# Patient Record
Sex: Female | Born: 1960 | Race: White | Hispanic: Yes | Marital: Single | Smoking: Never smoker
Health system: Southern US, Community
[De-identification: ages and names within clinical notes are randomized; demographics above are authoritative.]

## PROBLEM LIST (undated history)

## (undated) DIAGNOSIS — G4730 Sleep apnea, unspecified: Secondary | ICD-10-CM

## (undated) DIAGNOSIS — T4145XA Adverse effect of unspecified anesthetic, initial encounter: Secondary | ICD-10-CM

## (undated) DIAGNOSIS — Z9889 Other specified postprocedural states: Secondary | ICD-10-CM

## (undated) DIAGNOSIS — J45909 Unspecified asthma, uncomplicated: Secondary | ICD-10-CM

## (undated) DIAGNOSIS — M1711 Unilateral primary osteoarthritis, right knee: Secondary | ICD-10-CM

## (undated) DIAGNOSIS — M1990 Unspecified osteoarthritis, unspecified site: Secondary | ICD-10-CM

## (undated) DIAGNOSIS — S069X9A Unspecified intracranial injury with loss of consciousness of unspecified duration, initial encounter: Secondary | ICD-10-CM

## (undated) DIAGNOSIS — F419 Anxiety disorder, unspecified: Secondary | ICD-10-CM

## (undated) DIAGNOSIS — T8859XA Other complications of anesthesia, initial encounter: Secondary | ICD-10-CM

## (undated) DIAGNOSIS — N946 Dysmenorrhea, unspecified: Secondary | ICD-10-CM

## (undated) DIAGNOSIS — Z8489 Family history of other specified conditions: Secondary | ICD-10-CM

## (undated) DIAGNOSIS — K219 Gastro-esophageal reflux disease without esophagitis: Secondary | ICD-10-CM

## (undated) DIAGNOSIS — E785 Hyperlipidemia, unspecified: Secondary | ICD-10-CM

## (undated) DIAGNOSIS — A630 Anogenital (venereal) warts: Secondary | ICD-10-CM

## (undated) DIAGNOSIS — D219 Benign neoplasm of connective and other soft tissue, unspecified: Secondary | ICD-10-CM

## (undated) DIAGNOSIS — M1731 Unilateral post-traumatic osteoarthritis, right knee: Secondary | ICD-10-CM

## (undated) DIAGNOSIS — R112 Nausea with vomiting, unspecified: Secondary | ICD-10-CM

## (undated) DIAGNOSIS — N939 Abnormal uterine and vaginal bleeding, unspecified: Secondary | ICD-10-CM

## (undated) HISTORY — DX: Benign neoplasm of connective and other soft tissue, unspecified: D21.9

## (undated) HISTORY — DX: Gastro-esophageal reflux disease without esophagitis: K21.9

## (undated) HISTORY — DX: Dysmenorrhea, unspecified: N94.6

## (undated) HISTORY — DX: Anxiety disorder, unspecified: F41.9

## (undated) HISTORY — PX: LIPOMA EXCISION: SHX5283

## (undated) HISTORY — DX: Abnormal uterine and vaginal bleeding, unspecified: N93.9

## (undated) HISTORY — DX: Anogenital (venereal) warts: A63.0

---
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
## 1984-06-21 DIAGNOSIS — S069X9A Unspecified intracranial injury with loss of consciousness of unspecified duration, initial encounter: Secondary | ICD-10-CM

## 1984-06-21 HISTORY — PX: RECONSTRUCTION OF NOSE: SHX2301

## 1984-06-21 HISTORY — DX: Unspecified intracranial injury with loss of consciousness of unspecified duration, initial encounter: S06.9X9A

## 1992-06-21 DIAGNOSIS — A630 Anogenital (venereal) warts: Secondary | ICD-10-CM

## 1992-06-21 HISTORY — DX: Anogenital (venereal) warts: A63.0

## 1995-06-22 HISTORY — PX: BREAST SURGERY: SHX581

## 1995-09-20 HISTORY — PX: BREAST EXCISIONAL BIOPSY: SUR124

## 1999-03-05 ENCOUNTER — Other Ambulatory Visit: Admission: RE | Admit: 1999-03-05 | Discharge: 1999-03-05 | Payer: Self-pay | Admitting: *Deleted

## 1999-04-23 ENCOUNTER — Encounter: Payer: Self-pay | Admitting: Obstetrics and Gynecology

## 1999-04-23 ENCOUNTER — Ambulatory Visit (HOSPITAL_COMMUNITY): Admission: RE | Admit: 1999-04-23 | Discharge: 1999-04-23 | Payer: Self-pay | Admitting: Obstetrics and Gynecology

## 1999-05-28 ENCOUNTER — Other Ambulatory Visit: Admission: RE | Admit: 1999-05-28 | Discharge: 1999-05-28 | Payer: Self-pay | Admitting: Surgery

## 2000-03-14 ENCOUNTER — Other Ambulatory Visit: Admission: RE | Admit: 2000-03-14 | Discharge: 2000-03-14 | Payer: Self-pay | Admitting: *Deleted

## 2001-06-21 HISTORY — PX: SHOULDER SURGERY: SHX246

## 2001-07-07 ENCOUNTER — Other Ambulatory Visit: Admission: RE | Admit: 2001-07-07 | Discharge: 2001-07-07 | Payer: Self-pay | Admitting: Obstetrics and Gynecology

## 2002-07-25 ENCOUNTER — Other Ambulatory Visit: Admission: RE | Admit: 2002-07-25 | Discharge: 2002-07-25 | Payer: Self-pay | Admitting: Obstetrics and Gynecology

## 2002-09-11 ENCOUNTER — Encounter (INDEPENDENT_AMBULATORY_CARE_PROVIDER_SITE_OTHER): Payer: Self-pay | Admitting: Specialist

## 2002-09-11 ENCOUNTER — Inpatient Hospital Stay (HOSPITAL_COMMUNITY): Admission: RE | Admit: 2002-09-11 | Discharge: 2002-09-13 | Payer: Self-pay | Admitting: Obstetrics and Gynecology

## 2002-09-11 HISTORY — PX: ABDOMINAL HYSTERECTOMY: SHX81

## 2004-10-23 ENCOUNTER — Ambulatory Visit (HOSPITAL_COMMUNITY): Admission: RE | Admit: 2004-10-23 | Discharge: 2004-10-23 | Payer: Self-pay | Admitting: *Deleted

## 2004-10-29 ENCOUNTER — Encounter: Admission: RE | Admit: 2004-10-29 | Discharge: 2004-10-29 | Payer: Self-pay | Admitting: *Deleted

## 2006-04-27 ENCOUNTER — Ambulatory Visit: Payer: Self-pay | Admitting: Internal Medicine

## 2006-04-29 ENCOUNTER — Encounter: Admission: RE | Admit: 2006-04-29 | Discharge: 2006-04-29 | Payer: Self-pay | Admitting: Obstetrics & Gynecology

## 2006-05-24 ENCOUNTER — Ambulatory Visit: Payer: Self-pay | Admitting: Internal Medicine

## 2006-10-25 ENCOUNTER — Encounter: Admission: RE | Admit: 2006-10-25 | Discharge: 2006-10-25 | Payer: Self-pay | Admitting: Obstetrics & Gynecology

## 2007-03-07 ENCOUNTER — Ambulatory Visit: Payer: Self-pay | Admitting: Internal Medicine

## 2007-03-09 ENCOUNTER — Ambulatory Visit (HOSPITAL_COMMUNITY): Admission: RE | Admit: 2007-03-09 | Discharge: 2007-03-09 | Payer: Self-pay | Admitting: Internal Medicine

## 2007-04-06 ENCOUNTER — Ambulatory Visit: Payer: Self-pay | Admitting: Internal Medicine

## 2007-04-06 DIAGNOSIS — K449 Diaphragmatic hernia without obstruction or gangrene: Secondary | ICD-10-CM

## 2007-05-02 ENCOUNTER — Encounter: Admission: RE | Admit: 2007-05-02 | Discharge: 2007-05-02 | Payer: Self-pay | Admitting: Obstetrics & Gynecology

## 2007-05-15 ENCOUNTER — Ambulatory Visit: Payer: Self-pay | Admitting: Internal Medicine

## 2007-07-14 DIAGNOSIS — F329 Major depressive disorder, single episode, unspecified: Secondary | ICD-10-CM

## 2007-07-14 DIAGNOSIS — K219 Gastro-esophageal reflux disease without esophagitis: Secondary | ICD-10-CM

## 2007-07-14 DIAGNOSIS — E785 Hyperlipidemia, unspecified: Secondary | ICD-10-CM | POA: Insufficient documentation

## 2007-12-18 ENCOUNTER — Other Ambulatory Visit: Admission: RE | Admit: 2007-12-18 | Discharge: 2007-12-18 | Payer: Self-pay | Admitting: Obstetrics and Gynecology

## 2007-12-25 ENCOUNTER — Encounter: Admission: RE | Admit: 2007-12-25 | Discharge: 2007-12-25 | Payer: Self-pay | Admitting: Obstetrics & Gynecology

## 2008-01-02 ENCOUNTER — Telehealth (INDEPENDENT_AMBULATORY_CARE_PROVIDER_SITE_OTHER): Payer: Self-pay | Admitting: *Deleted

## 2008-01-04 ENCOUNTER — Encounter: Payer: Self-pay | Admitting: Internal Medicine

## 2008-07-12 ENCOUNTER — Encounter: Payer: Self-pay | Admitting: Pulmonary Disease

## 2008-09-05 ENCOUNTER — Encounter: Payer: Self-pay | Admitting: Pulmonary Disease

## 2009-01-03 ENCOUNTER — Ambulatory Visit (HOSPITAL_COMMUNITY): Admission: RE | Admit: 2009-01-03 | Discharge: 2009-01-03 | Payer: Self-pay | Admitting: Obstetrics & Gynecology

## 2009-09-08 ENCOUNTER — Encounter: Admission: RE | Admit: 2009-09-08 | Discharge: 2009-09-08 | Payer: Self-pay | Admitting: Occupational Medicine

## 2010-01-06 ENCOUNTER — Ambulatory Visit (HOSPITAL_COMMUNITY): Admission: RE | Admit: 2010-01-06 | Discharge: 2010-01-06 | Payer: Self-pay | Admitting: Obstetrics & Gynecology

## 2010-04-30 ENCOUNTER — Ambulatory Visit: Payer: Self-pay | Admitting: Pulmonary Disease

## 2010-04-30 DIAGNOSIS — G4730 Sleep apnea, unspecified: Secondary | ICD-10-CM | POA: Insufficient documentation

## 2010-05-02 ENCOUNTER — Encounter: Payer: Self-pay | Admitting: Pulmonary Disease

## 2010-05-14 DIAGNOSIS — R0989 Other specified symptoms and signs involving the circulatory and respiratory systems: Secondary | ICD-10-CM

## 2010-05-14 DIAGNOSIS — R0609 Other forms of dyspnea: Secondary | ICD-10-CM

## 2010-05-20 ENCOUNTER — Telehealth (INDEPENDENT_AMBULATORY_CARE_PROVIDER_SITE_OTHER): Payer: Self-pay | Admitting: *Deleted

## 2010-05-25 ENCOUNTER — Ambulatory Visit (HOSPITAL_COMMUNITY)
Admission: RE | Admit: 2010-05-25 | Discharge: 2010-05-25 | Payer: Self-pay | Source: Home / Self Care | Admitting: Pulmonary Disease

## 2010-05-25 ENCOUNTER — Encounter: Payer: Self-pay | Admitting: Pulmonary Disease

## 2010-06-23 ENCOUNTER — Telehealth (INDEPENDENT_AMBULATORY_CARE_PROVIDER_SITE_OTHER): Payer: Self-pay | Admitting: *Deleted

## 2010-06-25 ENCOUNTER — Ambulatory Visit
Admission: RE | Admit: 2010-06-25 | Discharge: 2010-06-25 | Payer: Self-pay | Source: Home / Self Care | Attending: Pulmonary Disease | Admitting: Pulmonary Disease

## 2010-06-25 DIAGNOSIS — J45909 Unspecified asthma, uncomplicated: Secondary | ICD-10-CM | POA: Insufficient documentation

## 2010-07-08 ENCOUNTER — Telehealth (INDEPENDENT_AMBULATORY_CARE_PROVIDER_SITE_OTHER): Payer: Self-pay | Admitting: *Deleted

## 2010-07-10 ENCOUNTER — Ambulatory Visit
Admission: RE | Admit: 2010-07-10 | Discharge: 2010-07-10 | Payer: Self-pay | Source: Home / Self Care | Attending: Pulmonary Disease | Admitting: Pulmonary Disease

## 2010-07-10 DIAGNOSIS — G4733 Obstructive sleep apnea (adult) (pediatric): Secondary | ICD-10-CM | POA: Insufficient documentation

## 2010-07-23 NOTE — Assessment & Plan Note (Signed)
Summary: self referral for doe   Copy to:  Self Referral Primary Provider/Referring Provider:  n/a  CC:  pt c/o pt states she has exercise induced asthma. .  History of Present Illness: The pt is a 49y/o female who comes in today as a self referral for dyspnea.  She currently works out at [employer] which is quite strenous and challenging, and feels like she "can't breathe" during the work out.  She does not have any issue with more normal exercise or daily activities.  She can hear audible "wheezing" when this occurs, and wonders whether this may be a manifestation of asthma.  It will usually go away with 10-35min of rest.  She denies any issue with cough or mucus.  What is odd is the pt describes the "wheezing" as being more on inhalation than exhalation.  She has tried proair in the past with no change.  The pt denies any h/o childhood asthma or other pulmonary issues.  She denies any postnasal drip or sinus issues, but does have significant GERD for which she takes zegerid.  She does not feel she has breakthru symptoms if she stays on the med compliantly.  She tells me that she had a cxr last year,and was unremarkable.  Preventive Screening-Counseling & Management  Alcohol-Tobacco     Smoking Status: never  Current Medications (verified): 1)  Zegerid 40-1100 Mg  Caps (Omeprazole-Sodium Bicarbonate) .... Two Times A Day 2)  Proair Hfa 108 (90 Base) Mcg/act Aers (Albuterol Sulfate) .Marland Kitchen.. 1-2 Puffs Every 4-6 Hrs As Needed  Allergies (verified): No Known Drug Allergies  Past History:  Past Medical History:  SLEEP APNEA (ICD-780.57) DEPRESSION (ICD-311) HYPERLIPIDEMIA (ICD-272.4) HIATAL HERNIA (ICD-553.3) GERD (ICD-530.81)    Past Surgical History: hysterectomy in 2004 cyst removed in left breast nose surgery appendectomy in 1984 bone spurs removed right shoulder  Family History: Reviewed history and no changes required. breast cancer--pgm liver  cancer--pgm MI--mgf  Social History: Reviewed history and no changes required. Patient never smoked.  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  The patient denies shortness of breath at rest, productive cough, non-productive cough, coughing up blood, chest pain, irregular heartbeats, indigestion, loss of appetite, weight change, abdominal pain, difficulty swallowing, sore throat, tooth/dental problems, headaches, nasal congestion/difficulty breathing through nose, sneezing, itching, anxiety, depression, hand/feet swelling, rash, change in color of mucus, and fever.    Vital Signs:  Patient profile:   50 year old female Height:      64 inches Weight:      215.50 pounds BMI:     37.12 O2 Sat:      100 % on Room air Temp:     98.1 degrees F oral Pulse rate:   63 / minute BP sitting:   100 / 62  (left arm) Cuff size:   large  Vitals Entered By: Carver Fila (April 30, 2010 11:13 AM)  O2 Flow:  Room air CC: pt c/o pt states she has exercise induced asthma.  Comments meds and allergies updated Phone number updated Mindy Edward Jolly  April 30, 2010 11:13 AM    Physical Exam  General:  obese female in nad Eyes:  PERRLA and EOMI.   Nose:  large turbinates with swelling and narrowing of nasal airway Mouth:  no exudates or other lesions seen. Neck:  no jvd, tmg, LN Lungs:  totally clear to auscultation, no wheezing or rhonchi no upper airway noise Heart:  rrr, 2/6 sem. Abdomen:  soft and nontender, bs+ Extremities:  no significant edema or cyanosis  pulses intact distally. Neurologic:  alert and oriented, moves all 4.   Impression & Recommendations:  Problem # 1:  DYSPNEA ON EXERTION (ICD-786.09) the pt is noting doe and audible "wheezing" while doing extremely strenous crossfit, but does not have with more traditional  exercise activity.  She has no issues with her day to day life, which is quite active.  Her spirometry today is totally normal, and the inspiratory limb on her flow volume loop is totally normal (nothing to suggest ua obstruction that would explain her wheezing on inspiration).  I am wondering if this is related to severe straining during crossfit with upper airway pseudowheezing.  Her sob may simply be related to the strenous nature of the exercise.  Her history is not suggestive of EIA, but I cannot exclude.  She does have significant GERD symptoms, and I wonder if this may be hypersensitizing her ua?  Will try her on two times a day PPI to see if things improve.  If they do not, would consider doing EIA protocol on treadmill for evaluation.  The pt is to call and let me know.  Medications Added to Medication List This Visit: 1)  Proair Hfa 108 (90 Base) Mcg/act Aers (Albuterol sulfate) .Marland Kitchen.. 1-2 puffs every 4-6 hrs as needed  Patient Instructions: 1)  will try to double up on your acid reflux medication.  Take your zegerid in the am's, and dexilant at bedtime.  Let me know in 2 weeks how things are going.  If this continues, will schedule for exercise induced asthma study.   Immunization History:  Influenza Immunization History:    Influenza:  historical (04/21/2010)   Appended Document: Orders Update    Clinical Lists Changes  Orders: Added new Service order of New Patient Level IV (16109) - Signed Added new Service order of Spirometry w/Graph (94010) - Signed

## 2010-07-23 NOTE — Progress Notes (Signed)
Summary: MCT results---received  Phone Note Call from Patient Call back at Home Phone [phone]   Caller: Patient Call For: clance Reason for Call: Lab or Test Results Summary of Call: Pt requests results of methacholine test. Initial call taken by: Darletta Moll,  June 23, 2010 1:35 PM  Follow-up for Phone Call        Pls advise results of MCT done on 05/26/10, thanks! Vernie Murders  June 23, 2010 3:49 PM   Additional Follow-up for Phone Call Additional follow up Details #1::        please track down results of meth challenge and will cal pt with results. Additional Follow-up by: Barbaraann Share MD,  June 23, 2010 10:08 PM    Additional Follow-up for Phone Call Additional follow up Details #2::    MCT results received and placed in KC's purple very important folder.Michel Bickers CMA  June 24, 2010 9:02 AM  Additional Follow-up for Phone Call Additional follow up Details #3:: Details for Additional Follow-up Action Taken: please let pt know that her meth challenge test was positive for reactive airways disease....she needs to get started on some type of inhaler.  see if she can come in for a sample and demonstration of how to use...see if tomorrow at 10 ok   called and spoke with pt and she is coming in at 10 to discuss these results Carver Fila  June 24, 2010 5:32 PM  Additional Follow-up by: Barbaraann Share MD,  June 24, 2010 5:27 PM

## 2010-07-23 NOTE — Progress Notes (Signed)
Summary: DOE no better-LMTCbx1  Phone Note Call from Patient   Caller: Patient Call For: clance Summary of Call: pt still c/o sob w/ activity. ready to have tests done per dr clance/ discussion w/ pt at last ov. call pt at work # until 4:30 [phone]. cell afterwards [phone] Initial call taken by: Tivis Ringer, CNA,  May 20, 2010 2:27 PM  Follow-up for Phone Call        Spoke with pt.  She was last seen for initial consult for DOE on 04-30-10 and was told to double up acid suppression meds and call back with progress.  She states that her breathing is unchanged.  She would like to proceed with next step.  Pls advise thanks! Follow-up by: Vernie Murders,  May 20, 2010 3:41 PM  Additional Follow-up for Phone Call Additional follow up Details #1::        she will need to be scheduled for methacholine challenge test.  Let her know this is a more sensitive test for asthma than the treadmill test we discussed.  it involves blowing into breathing machine after breathing in a medication that can irritate the airways in pts with asthma.  It will put issue of asthma to rest.  I will send order to [facility] Additional Follow-up by: Barbaraann Share MD,  May 20, 2010 5:18 PM    Additional Follow-up for Phone Call Additional follow up Details #2::    [email or web address]. Carron Curie CMA  May 20, 2010 5:23 PM  spoke to pt she is aware of appt for methacholine @[facility]  05/26/10@1 :00pm however she may call to reschedule she was given direct# to call to do so  Follow-up by: Oneita Jolly,  May 21, 2010 9:07 AM

## 2010-07-23 NOTE — Progress Notes (Signed)
Summary: quitt medication and sleep study > discussed at 1.20.12 OV  Phone Note Call from Patient   Caller: Patient Call For: dr clance Summary of Call: patient phoned she had her sleep study at the [facility] [address] [place] over a year ago. Stated she signed the release the last time that she was here but could not remember where she had it at. Patient can be reached [phone]. Also she quitt the [facility] she stated that it made her sick and made her Ashtma worse.  Follow-up for Phone Call        Pt c/o having headaches, chest congestion "fluid in chest", nasal drainage "snot clots" green/red in color. Last dose of Dulera was on Monday and no longer has above symptoms. Sleep study obtained and placed on Megan's desk. Thanks. Zackery Barefoot CMA  July 08, 2010 12:49 PM    Additional Follow-up for Phone Call Additional follow up Details #1::        she has appt with me on friday for sleep apnea. will discuss other alternatives with her then. Additional Follow-up by: Barbaraann Share MD,  July 08, 2010 4:48 PM    Additional Follow-up for Phone Call Additional follow up Details #2::    [email or web address] Vernie Murders  July 08, 2010 4:57 PM  [facility].  Aundra Millet Reynolds LPN  July 09, 2010 4:39 PM   pt was arrived and seen for consult w/ KC on 1.20.12.  will sign off on message. Boone Master CNA/MA  July 13, 2010 5:15 PM

## 2010-07-23 NOTE — Assessment & Plan Note (Signed)
Summary: rov to discuss +meth. challenge test.   Copy to:  Self Referral Primary Provider/Referring Provider:  n/a  CC:  Pt is here for a f/u appt to discuss MCT results.  .  History of Present Illness: the pt comes in today for f/u of her recent methacholine challenge test, done as part of a w/u for possible EIA or even chronic asthma.  She had a > 20% reduction in FVC and FEV1 at a dilution of 8.0, and only partial recovery with one treatment of albuterol.  I have reviewed the study with her in detail, and answered all of her questions.  Current Medications (verified): 1)  Zegerid 40-1100 Mg  Caps (Omeprazole-Sodium Bicarbonate) .... Take 1 Tablet By Mouth Once A Day  Allergies (verified): No Known Drug Allergies  Review of Systems       The patient complains of shortness of breath with activity, nasal congestion/difficulty breathing through nose, and joint stiffness or pain.  The patient denies shortness of breath at rest, productive cough, non-productive cough, coughing up blood, chest pain, irregular heartbeats, acid heartburn, indigestion, loss of appetite, weight change, abdominal pain, difficulty swallowing, sore throat, tooth/dental problems, headaches, sneezing, itching, ear ache, anxiety, depression, hand/feet swelling, rash, change in color of mucus, and fever.    Vital Signs:  Patient profile:   50 year old female Height:      64 inches Weight:      221.13 pounds BMI:     38.09 O2 Sat:      99 % on Room air Temp:     97.7 degrees F oral Pulse rate:   62 / minute BP sitting:   114 / 78  (left arm) Cuff size:   large  Vitals Entered By: Arman Filter LPN (June 25, 2010 9:59 AM)  O2 Flow:  Room air CC: Pt is here for a f/u appt to discuss MCT results.   Comments Medications reviewed with patient Arman Filter LPN  June 25, 2010 9:59 AM    Physical Exam  General:  ow female in nad  Lungs:  clear to auscultation Heart:  rrr Extremities:  no edema or  cyanosis Neurologic:  alert and oriented, moves all 4.   Impression & Recommendations:  Problem # 1:  INTRINSIC ASTHMA, UNSPECIFIED (ICD-493.10) the pt has a positive methacholine challenge test at a moderate dilution.  Along with her history, I suspect this is due to asthma.  It is unclear whether she may just have EIA, or whether it is more significant than that.  I would like to treat her aggressively up front with LABA/ICS, then try ICS alone if she does well.  Will start on dulera and see how she does.  Medications Added to Medication List This Visit: 1)  Zegerid 40-1100 Mg Caps (Omeprazole-sodium bicarbonate) .... Take 1 tablet by mouth once a day 2)  Dulera 100-5 Mcg/act Aero (Mometasone furo-formoterol fum) .... 2 puffs am and pm.  rinse mouth well 3)  Proair Hfa 108 (90 Base) Mcg/act Aers (Albuterol sulfate) .... 2 puffs every 4-6 hours as needed  Other Orders: Est. Patient Level III (60454)  Patient Instructions: 1)  will start you on dulera 100/50 2 puffs in the am and pm EVERYDAY whether you think you need it or not.  Rinse mouth very well after using. 2)  can use albuterol 2 puffs up to every 6hrs as needed for rescue only.  Do not use unless you are having a hard time 3)  Need to schedule a appt with me to discuss your sleep issues/sleep apnea.  Will get your sleep study sent to [place] in the interim.     Prescriptions: PROAIR HFA 108 (90 BASE) MCG/ACT  AERS (ALBUTEROL SULFATE) 2 puffs every 4-6 hours as needed  #1 x 6   Entered and Authorized by:   Barbaraann Share MD   Signed by:   Barbaraann Share MD on 06/25/2010   Method used:   Print then Give to Patient   RxID:   [id number] DULERA 100-5 MCG/ACT AERO (MOMETASONE FURO-FORMOTEROL FUM) 2 puffs am and pm.  rinse mouth well  #1 x 6   Entered and Authorized by:   Barbaraann Share MD   Signed by:   Barbaraann Share MD on 06/25/2010   Method used:   Print then Give to Patient   RxID:   [id number]   Appended  Document: rov to discuss +meth. challenge test. megan, please see if [facility] sleep [facility] on this pt from in the past.  Appended Document: rov to discuss +meth. challenge test. triage was able to locate sleep study and I have put it in your very important look at folder.

## 2010-07-28 ENCOUNTER — Telehealth: Payer: Self-pay | Admitting: Pulmonary Disease

## 2010-07-29 NOTE — Assessment & Plan Note (Signed)
Summary: self referral for osa, EDS   Copy to:  Self Referral Primary Provider/Referring Provider:  n/a  CC:  Sleep Consult.  History of Present Illness: The pt is a 49y/o female who comes in as a self referral for management of osa.  She was diagnosed in 2010 with moderate to severe osa, with AHI 34/hr and optimal pressure found to be 10cm.  She was started on cpap, and has been compliant with therapy.  She uses nasal mask, and does have a heated humidifier.  She denies any tolerance issues with the cpap.  She goes to bed at 9pm, and arises at 4am to start her day during the work week.  She does not feel completely rested upon awakening, and does have 2-3 awakenings during the night for unknown reasons.  She does note some sleep pressure during the day with inactivity, including paperwork and on occasion with driving.  She did not have an issue with EDS back in young adulthood.  She does have some leg discomfort in the evening while sitting, but no one has ever mentioned kicking during the night being an issue.  She occasionally has an issue with sleep maintenance insomnia, but this only occurs 2-3 times a month.  Her epworth score is elevated today at 14, but her weight is down 30 pounds over the last 2 yrs.  The pt also has a new diagnosis of asthma, but has had issues with tolerance of asmanex.  We have discussed the alternatives.  Current Medications (verified): 1)  Zegerid 40-1100 Mg  Caps (Omeprazole-Sodium Bicarbonate) .... Take 1 Tablet By Mouth Once A Day 2)  Proair Hfa 108 (90 Base) Mcg/act  Aers (Albuterol Sulfate) .... 2 Puffs Every 4-6 Hours As Needed  Allergies (verified): No Known Drug Allergies  Past History:  Past Medical History: asthma--+methacholine challenge. OSA--AHI 34/hr 2010 DEPRESSION (ICD-311) HYPERLIPIDEMIA (ICD-272.4) HIATAL HERNIA (ICD-553.3) GERD (ICD-530.81)    Past Surgical History: hysterectomy in 1993 cyst removed in left breast nose  surgery appendectomy in 1983 bone spurs removed right shoulder  Family History: Reviewed history from 04/30/2010 and no changes required. breast cancer--pgm liver cancer--pgm MI--mgf  Social History: Reviewed history from 04/30/2010 and no changes required. Patient never smoked.  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 no children.  Review of Systems       The patient complains of shortness of breath with activity, acid heartburn, and indigestion.  The patient denies shortness of breath at rest, productive cough, non-productive cough, coughing up blood, chest pain, irregular heartbeats, loss of appetite, weight change, abdominal pain, difficulty swallowing, sore throat, tooth/dental problems, headaches, nasal congestion/difficulty breathing through nose, sneezing, itching, ear ache, anxiety, depression, hand/feet swelling, joint stiffness or pain, rash, change in color of mucus, and fever.    Vital Signs:  Patient profile:   51 year old female Height:      64 inches Weight:      221.13 pounds BMI:     38.09 O2 Sat:      97 % on Room air Temp:     98.5 degrees F oral Pulse rate:   68 / minute BP sitting:   100 / 70  (left arm) Cuff size:   large  Vitals Entered By: Arman Filter LPN (July 10, 2010 1:48 PM)  O2 Flow:  Room air CC: Sleep Consult Comments Medications reviewed with patient Arman Filter LPN  July 10, 2010 1:59 PM    Physical Exam  General:  obese female in  nad Nose:  deviated septum to right with narrowing. Mouth:  elongation of soft palate, normal uvula. Neck:  large, no tmg, no LN Lungs:  clear to auscultation Heart:  rrr Extremities:  mild edema, no cyanosis  Neurologic:  awake, but appears sleepy moves all 4.   Impression & Recommendations:  Problem # 1:  OBSTRUCTIVE SLEEP APNEA (ICD-327.23) the pt has a h/o osa, and currently is on cpap which she wears compliantly.  She continues to have nonrestorative  sleep and some EDS, and it is unclear if this related to her osa.  This may be a mouth opening issue with her cpap mask, may not have adequate pressure setting, and she may fall into the category of sleep apnea pts with persistent EDS despite adequate treatment of osa.  Would also consider whether this may be related to stress or depression, or too little sleep??  Would finally consider whether she may have RLS as well.  Would like to first change her over to a full facemask, and also recheck her pressure with an auto device.    Problem # 2:  INTRINSIC ASTHMA, UNSPECIFIED (ICD-493.10) the pt had poor tolerance to asmanex, and felt it did not help her breathing.  Will change over to dulera to see how that does.  Other Orders: Est. Patient Level V (16109) DME Referral (DME)  Patient Instructions: 1)  stay off dulera 2)  trial of asmanex 2 inhalations each night at bedtime...rinse mouth well.  Give me some feedback how things are going 3)  will get download off your machine from dme 4)  will get you changed to a full face mask, and put on auto device for next 3 weeks to optimize your pressure 5)  continue to work on weight loss 6)  will contact you once I get your donwload off the auto machine.

## 2010-08-06 NOTE — Progress Notes (Signed)
Summary: cpap supplies-  Phone Note Call from Patient Call back at Home Phone [phone]   Caller: Patient Call For: clance Summary of Call: pt is still waiting on her cpap mask she said she uses [facility] Initial call taken by: Lacinda Axon,  July 28, 2010 8:17 AM  Follow-up for Phone Call        [facility] x1.  Order was sent to [facility].  Which company is pt using now? Abigail Miyamoto RN  July 28, 2010 9:40 AM   pt returned call from triage. Tivis Ringer, CNA  July 28, 2010 3:14 PM  Pt states she uses [facility] and not [facility]. I will forward to the PCC's so the order can be sent to correct facility. Follow-up by: Michel Bickers CMA,  July 28, 2010 3:37 PM  Additional Follow-up for Phone Call Additional follow up Details #1::        Called and spoke with Carolanne Grumbling at [facility] who states "that don't have her as a patient". I advised Carolanne Grumbling that she was a former patient of [facility] and that this order was faxed on 07/10/10. That pt has been waiting and no one has contacted her. I re-faxed the order to [facility] and noted that this was the second request and to contact pt today. Called and spoke with patient and advised her that [facility] was her DME since [facility] was purchased by them Advised pt that I had spoke with [facility] and asked them to contact her today about this order. Asked pt to let me know tomorrow around lunch if they have not been in touch with her about this order. Rhonda Cobb  July 28, 2010 4:08 PM

## 2010-11-03 NOTE — Assessment & Plan Note (Signed)
[facility]                         GASTROENTEROLOGY OFFICE NOTE   LILLYAN, HITSON                     MRN:          [id number]  DATE:03/07/2007                            DOB:          15-Mar-1961    HISTORY:  This is a 50 year old Marissa [name] with a  history of gastroesophageal reflux disease.  She was last evaluated in  this office May 24, 2006 after having presented the month previous  with indigestion, heartburn, chest pain, and epigastric pain.  She was  placed on Zegerid and at the time of followup was asymptomatic.  She has  continued on the medication as requested.  She states she was doing well  until about 6 weeks ago when she began to have problems with significant  epigastric pain.  This was constant for about 2 weeks and described as  intense.  She contacted the office and was asked to increase her Zegerid  to b.i.d.  Her symptoms have improved, but not resolved.  She does note  that symptoms are worse after meals.  She was also having problems with  constipation, for which she has been using MiraLax off and on with  variable results.  She does mention intermittent solid food dysphagia.  She has had significant nausea, though denies vomiting.  The pain does  not radiate.  No change in urine or stool color.  She does notice that  sometimes lying down seems to help.   CURRENT MEDICATIONS:  Zegerid 40 mg b.i.d.   PHYSICAL EXAM:  Well-appearing female in no acute distress.  Blood pressure 100/66, heart rate is 84, weight 215 pounds (increased 4  pounds).  HEENT:  Sclerae anicteric.  Conjunctivae pink.  Oral mucosa intact.  No  adenopathy.  LUNGS:  Clear.  HEART:  Regular.  ABDOMEN:  Soft with minimal tenderness in the epigastric region to  palpation.  Good bowel sounds heard.  No masses felt.  EXTREMITIES:  Without edema.   IMPRESSION:  1. Problems with epigastric pain, possibly due to reflux disease,  rule      out ulcer disease, rule out symptomatic gallbladder disease.  2. History of gastroesophageal reflux disease with intermittent      dysphagia.  Rule out peptic stricture.  3. Constipation.   RECOMMENDATIONS:  1. Abdominal ultrasound.  2. Continue Zegerid b.i.d.  3. Reflux precautions.  4. Schedule upper endoscopy with possible esophageal dilation.  The      nature of the      procedure, as well as the risks, benefits, and alternatives have      been reviewed.  She      understood and agreed to proceed.  5. Increase MiraLax 34 g daily into 16 ounces of water.  Titrate to      need.     Wilhemina Bonito. Marina Goodell, MD  Electronically Signed    JNP/MedQ  DD: 03/07/2007  DT: 03/07/2007  Job #: [id number]

## 2010-11-03 NOTE — Assessment & Plan Note (Signed)
[facility]                         GASTROENTEROLOGY OFFICE NOTE   MEGYN, LENG                     MRN:          [id number]  DATE:05/15/2007                            DOB:          31-Aug-1960    HISTORY:  The patient presents today for followup.  She is a 50 year old  with reflux disease.  She was evaluated in September for abdominal pain.  Abdominal ultrasound was performed and found to be normal.  Upper  endoscopy was performed and revealed a small sliding hiatal hernia.  She  was felt to have reflux disease.  She had also complained of dysphagia.  No obvious stricture.  No dilation performed.  She reports today that on  Zegerid 40 mg daily, her reflux symptoms are well-controlled.  Occasionally worse with dietary indiscretion.  Rare bouts of solid food  dysphagia.  She has a new complaint of perianal itching.  Some  discomfort with defecation.  No bleeding.   MEDICATIONS:  Zegerid and Zetia.   PHYSICAL EXAMINATION:  GENERAL:  Well-appearing female in no acute  distress.  VITAL SIGNS:  Blood pressure 126/82, heart rate 64, weight 225 pounds.  ABDOMEN:  Soft without tenderness, mass or hernia.  RECTAL:  Small posterior fissure, otherwise normal.  Heme negative  stool.   IMPRESSION:  1. Gastroesophageal reflux disease.  Symptoms controlled on Zegerid.  2. Mild intermittent solid food dysphagia likely due to subtle ring      not appreciated on endoscopy.  3. History of constipation.  4. Anal fissure with pruritus.   RECOMMENDATIONS:  1. Continue Zegerid.  2. Reflux precautions with attention to weight loss. Consider dilation      if dysphagia worsens.  3. Fiber supplementation with Metamucil.  4. Analpram p.r.n.  5. GI followup p.r.n.     Wilhemina Bonito. Marina Goodell, MD  Electronically Signed    JNP/MedQ  DD: 05/15/2007  DT: 05/16/2007  Job #: [id number]

## 2010-11-06 NOTE — Assessment & Plan Note (Signed)
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 She was evaluated  April 27, 2006 for indigestion, heartburn, chest pain and epigastric  pain. See that dictation for details. She was felt to have symptomatic  reflux disease. There were no alarm symptoms. She was educated with  regards to reflux disease as well as reflux precautions. She was placed  on Zegerid 40 mg daily. She returns today for follow up as requested.  Since the time of her last visit, she has had complete resolution of  symptoms. She is experiencing no medication side effects. She is  pleased. She does have several questions regarding her condition and  treatment. These were answered.   PHYSICAL EXAMINATION:  Today finds a well-appearing female in no acute  distress. Blood pressure 110/62, heart rate is 60 and regular, weight is  211.2 pounds.  HEENT:  Sclerae are anicteric.  LUNGS:  Clear.  HEART:  Regular.  ABDOMEN:  Soft without tenderness, masses or hernias.   IMPRESSION:  Gastroesophageal reflux disease. Symptoms relieved with  Zegerid.   RECOMMENDATIONS:  1. Continue reflux precautions.  2. Continue daily protein-pump inhibitor therapy.  3. Office followup in one year unless questions or problems.     Wilhemina Bonito. Marina Goodell, MD  Electronically Signed    JNP/MedQ  DD: 05/24/2006  DT: 05/25/2006  Job #: [id number]   cc:   Vesta Mixer, M.D.

## 2010-11-06 NOTE — Assessment & Plan Note (Signed)
[facility]                           GASTROENTEROLOGY OFFICE NOTE   Marissa, [name]                     MRN:          [id number]  DATE:04/27/2006                            DOB:          Jan 26, 1961    PROBLEM:  Indigestion, heart burn, chest pain and epigastric pain.   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 She had initially gone to the  emergency room in September of 2007 when she experienced chest pain which  radiated into the left chest and her shoulder. She had labs and cardiac  panel, EKG done in the emergency room which were negative and then was  referred to Dr. Elease Hashimoto who has since performed a stress Cardiolite. This was  negative and she is referred to GI for further evaluation.   On retrospect the patient says that she has had ongoing problems with  indigestion and heart burn for at least the past year and probably longer  .She says she did not used to have daily symptoms but at this point is  having daily symptoms present, both during the daytime hours and sometimes  at night. Her symptoms are worse postprandially. She denies any dysphagia or  odynophagia. She describes primarily heart burn and indigestion. She does  feel that stress aggravates her symptoms and generally is stressed at work,  and also has some personal issues currently. She says that the chest pain  has not recurred since being evaluated in the emergency room. She does  mention that she has been having some epigastric discomfort intermittently  and had some last evening. This was associated with nausea but no vomiting.  She describes it as an acid burning type feeling. She has not had any  vomiting, has no problems with diarrhea, melena or hematochezia. She has  tried over-the-counter Pepcid which she feels helps a little bit but does  not alleviate her symptoms. She has not been on  any prescription medication.  She denies any aspirin or NSAID use.   CURRENT MEDICATIONS:  A multivitamin daily.   ALLERGIES:  No known drug allergies.   PAST MEDICAL HISTORY:  1. Pertinent for hyperlipidemia.  2. History of depression.  3. She is status post hysterectomy in 2004.  4. Had breast surgery in 2001.  5. An appendectomy in 1984.   FAMILY HISTORY:  Negative for colon cancer or polyps. Mother with history of  ulcer disease. Breast cancer in her grandmother.   SOCIAL HISTORY:  The patient is single, lives alone. She is employed as a  Animator. She is a nonsmoker, drinks alcohol socially.   REVIEW OF SYSTEMS:  HEENT: The patient does wear eyeglasses.  CARDIOVASCULAR:  Chest pain as outlined above. GI: As outlined above. GYN:  Negative. MUSCULOSKELETAL: Negative. All other systems reviewed and  negative.   PHYSICAL EXAMINATION:  GENERAL: Well-developed white female in no acute  distress. Weight is 208.  VITAL SIGNS: Blood pressure 118/62, pulse is 78.  HEENT:  Atraumatic, normocephalic. EOMI. Sclerae anicteric.  NECK:  Supple.  CARDIOVASCULAR: Regular rate  and rhythm with S1 and S2, no murmur, rub, or  gallop.  PULMONARY: Clear to A&P.  ABDOMEN: Soft and nontender, there is no palpable mass or  hepatosplenomegaly. Bowel sounds are active.  RECTAL EXAM:  Not done today.  EXTREMITIES: Without clubbing, cyanosis or edema.   IMPRESSION:  1. [age]-year-old white female with chronic GERD, with recent chest      pain probably secondary to reflux or reflux esophagitis. Negative      cardiac work up.  2. Epigastric pain. Again, secondary to reflux or gastritis. Possibly      irritable bowel syndrome or gallbladder disease.   PLAN:  1. I reviewed an antireflux regimen with her and she was provided with      educational information.  2. Zegerid 40 mg p.o. q.a.m., prescription and samples were given today,      prescription for 1 year.  3. Return office visit  in 4-6 weeks or sooner p.r.n. depending on her      response. Will decide at follow-up whether abdominal ultrasound and/or      EGD indicated.      Mike Gip, PA-C  Electronically Signed      Wilhemina Bonito. Eda Keys., MD  Electronically Signed   AE/MedQ  DD: 04/27/2006  DT: 04/28/2006  Job #: [id number]

## 2010-11-06 NOTE — Op Note (Signed)
NAME:  Marissa [name], Marissa [name]                        ACCOUNT NO.:  [id number]   MEDICAL RECORD NO.:  [id number]                   PATIENT TYPE:  INP   LOCATION:  9325                                 FACILITY:  [facility]   PHYSICIAN:  Laqueta Linden, M.D.                 DATE OF BIRTH:  04/28/1961   DATE OF PROCEDURE:  09/11/2002  DATE OF DISCHARGE:                                 OPERATIVE REPORT   PREOPERATIVE DIAGNOSES:  1. Leiomyomata uteri serosal, intramural, and submucosal.  2. Multiple endometrial polyps, submucosal fibroids.   POSTOPERATIVE DIAGNOSES:  1. Leiomyomata uteri serosal, intramural, and submucosal.  2. Multiple endometrial polyps, submucosal fibroids.   PROCEDURE:  Total abdominal hysterectomy.   SURGEON:  Laqueta Linden, M.D.   ASSISTANT:  Andres Ege, M.D.   ANESTHESIA:  General endotracheal.   ESTIMATED BLOOD LOSS:  100 mL.   URINE OUTPUT:  650 mL.   FLUIDS:  2100 mL crystalloid.   COUNTS:  Correct x2.   COMPLICATIONS:  None.   INDICATIONS:  The patient is a 50 year old nulligravid female with multiple  intramural, serosa, and submucosal fibroids with several large endometrial  polyps with a history of abnormal bleeding and worsening pelvic pressure  unresponsive to oral contraceptive use.  Due to the multitude of her  fibroids as well as the multiple large intrauterine lesions, she elected a  hysterectomy over attempt at submucosal resection and/or ablation.  She  desired permanent sterilization.  She has been extensively counseled as to  the risks, benefits, alternatives, and complications of the procedure  including, but not limited to, anesthesia risks, infection, bleeding  requiring transfusion, injury to bowel, bladder, ureters, vessels, nerves,  the possibility of fistula formation, possibility of other postoperative  complications including DVT, PE, pneumonia, death, and other unnamed risks  as well as postoperative sexual functioning, return  to work with normal  activity, and hormonal function.  The ovaries will be retained unless there  is an indication to remove them intraoperatively.  She has seen the informed  consent film, voiced her understanding and acceptance of all risks and  signed the appropriate consents.  She also has agreed to be permanently and  irreversibly sterilized as a result.  She has received Ancef 1 g IV  antibiotic prophylaxis preoperatively.   PROCEDURE:  The patient was taken to the operating room.  After proper  identification and consents were ascertained she was placed on the operating  table in supine position.  After the induction of general endotracheal  anesthesia was placed in the frog leg position and the abdomen, perineum,  and vagina were prepped and draped in a routine sterile fashion.  A  transurethral Foley was placed.  A transverse incision was then made just  above the symphysis and carried down to the level of the anterior rectus  fascia.  The fascia was incised.  Rectus muscles separated and  parietoperitoneum elevated and incised.  The incision was extended  superiorly and inferiorly to the level of bladder.  The patient was status  post appendectomy.  There were no significant adhesions.  Both renal  contours palpated normally.  There were no obvious gallstones.  The self  retaining retractor was placed and bowel packed into the upper abdomen with  moistened lap packs.  The uterus was noted to be distorted by multiple  serosal fibroids, particularly posterior and lower uterine segment.  Both  tubes and ovaries appeared completely normal.  The uterus was elevated out  of the operative field.  Round ligaments were clamped, cut, and suture  ligated and tagged.  Dissection was carried forward posteriorly in the broad  ligament.  A window was made in the posterior broad ligament and curved  Heaney clamps placed across the proximal tube and utero-ovarian ligament  bilaterally with  pedicles cut and doubly ligated with a free tie and then a  stitch of 0 Vicryl with retention of both adnexa.  The adnexa were suspended  from the ipsilateral round ligament at the conclusion of the procedure.  The  uterine vessels were then skeletonized and curved Heaney clamps placed  across the vessels at the level of the internal os bilaterally.  This was  somewhat difficult on both sides due to the location of the fibroids.  The  patient also has quite thickened posterior cul-de-sac serosa with what  appeared to be endometriosis with multiple implants on the posterior lower  uterine segment and cul-de-sac peritoneum.  The uterine vessels were then  cut and suture ligated.  Successive straight Heaney clamps were placed  across the cardinal ligaments bilaterally with pedicles cut and suture  ligated.  The posterior serosa was then bluntly advanced off of the cervix  allowing the uterosacral ligaments to be clamped, cut, and suture ligated as  well.  The upper vaginal angles were entered and the cervix circumscribed  with Satinsky scissors and the specimen was excised and sent to pathology  for final sectioning.  The uterine angles were then closed with Richardson  angle sutures and the remainder of the vaginal cuff was closed from front to  back using interrupted figure-of-eight sutures of 0 Vicryl.  Several small  bleeding points were cauterized.  Both ureters were visualized, noted to be  of normal caliber, and peristalsing normally at the conclusion of the  procedure.  There were several small oozing points from the peritoneal  dissection and from the posterior aspect of the vaginal cuff due to the  endometriosis and the dissection of this off of the lower uterine segment.  The posterior uterosacral ligaments were plicated to prevent enterocele  formation.  Copious lavage was accomplished and hemostasis was felt to be excellent.  All lap packs were removed.  Sponge counts, needle,  and  instrument counts were correct prior to closure of the abdomen.  The  peritoneum was closed in a running fashion using 2-0 Vicryl suture.  The  rectus muscles were loosely reapproximated in the midline.  Subfascial  hemostasis was ascertained and the fascia was closed from both lateral  aspects to the midline using a running stitch of 0 Maxon.  Subcutaneous  hemostasis was ascertained.  The skin was then closed with skin clips.  Steri-Strips and pressure dressing was then applied.  The patient received  Toradol 30 mg IV, 30 mg IM prior to transfer to the PACU.  She was stable  and extubated upon transfer.  ESTIMATED BLOOD LOSS:  100 mL.   URINE OUTPUT:  650 mL slightly blood tinged.   FLUIDS:  2100 mL of crystalloid.   COUNTS:  Correct x2.   COMPLICATIONS:  None.                                               Laqueta Linden, M.D.    LKS/MEDQ  D:  09/11/2002  T:  09/11/2002  Job:  [id number]

## 2011-01-13 ENCOUNTER — Other Ambulatory Visit: Payer: Self-pay | Admitting: Occupational Medicine

## 2011-01-13 ENCOUNTER — Ambulatory Visit
Admission: RE | Admit: 2011-01-13 | Discharge: 2011-01-13 | Disposition: A | Discharge status: Home / Self Care | Payer: PRIVATE HEALTH INSURANCE | Source: Ambulatory Visit | Attending: Occupational Medicine | Admitting: Occupational Medicine

## 2011-01-13 DIAGNOSIS — T148XXA Other injury of unspecified body region, initial encounter: Secondary | ICD-10-CM

## 2011-01-13 DIAGNOSIS — R52 Pain, unspecified: Secondary | ICD-10-CM

## 2011-01-15 ENCOUNTER — Ambulatory Visit (INDEPENDENT_AMBULATORY_CARE_PROVIDER_SITE_OTHER): Payer: Self-pay | Admitting: Family Medicine

## 2011-01-15 ENCOUNTER — Encounter: Payer: Self-pay | Admitting: Family Medicine

## 2011-01-15 ENCOUNTER — Other Ambulatory Visit: Payer: PRIVATE HEALTH INSURANCE | Admitting: Family Medicine

## 2011-01-15 VITALS — BP 134/81 | HR 67 | Temp 97.7°F | Ht 64.0 in | Wt 210.0 lb

## 2011-01-15 DIAGNOSIS — M79609 Pain in unspecified limb: Secondary | ICD-10-CM

## 2011-01-15 DIAGNOSIS — M79641 Pain in right hand: Secondary | ICD-10-CM

## 2011-01-15 NOTE — Patient Instructions (Signed)
Your ultrasound is consistent with traumatic tendinitis of you 1st and 4th dorsal compartments of your wrist in addition to bony contusions. No restrictions on your activities at work. Ice the areas 15 minutes at a time 3-4 times a day. Take aleve 1-2 tabs twice a day with food for pain and inflammation. Wear the wrist brace outside of work and at bedtime to help rest this. Symptoms should continue to improve over the next 4 weeks. Physical therapy is an option for the tendinitis if you don't continue to improve. Only time will heal the bony contusions.

## 2011-01-17 ENCOUNTER — Encounter: Payer: Self-pay | Admitting: Family Medicine

## 2011-01-17 NOTE — Progress Notes (Signed)
Subjective:    Patient ID: Marissa [name], female    DOB: 1960-11-17, 50 y.o.   MRN: [id number]  PCP: None listed  Referred by Dr. Alto Denver [facility])  HPI 50 yo F here with right hand injury/pain.  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 Occurred when the crank/handle on a boat spun and hit the dorsal aspect of this hand/wrist. She is right handed. Her team leader advised she get it checked out so went to [facility]. First visit was 6/28 - diagnosed with right hand contusion - advised to ice the area, returned to full duty. Returned for follow-up on 7/25 still having pain at a 2/10 though had improved some. Using a soft wrist wrap for support - advised to ice, take nsaids for pain and referred to [place] for further eval and ultrasound. X-rays done 7/25 of right hand were also negative. Has occasionally taken motrin.  Pain currently minimal and has full motion of wrist and fingers.  Past Medical History  Diagnosis Date  . GERD (gastroesophageal reflux disease)     No current outpatient prescriptions on file prior to visit.    Past Surgical History  Procedure Date  . Reconstruction of nose   . Abdominal hysterectomy   . Shoulder surgery     No Known Allergies  History   Social History  . Marital Status: Single    Spouse Name: N/A    Number of Children: N/A  . Years of Education: N/A   Occupational History  . Not on file.   Social History Main Topics  . Smoking status: Never Smoker   . Smokeless tobacco: Not on file  . Alcohol Use: Not on file  . Drug Use: Not on file  . Sexually Active: Not on file   Other Topics Concern  . Not on file   Social History Narrative  . No narrative on file    Family History  Problem Relation Age of Onset  . Diabetes Mother   . Hyperlipidemia Mother   . Hypertension Mother   . Sudden death Maternal  Grandfather   . Heart attack Neg Hx     BP 134/81  Pulse 67  Temp(Src) 97.7 F (36.5 C) (Oral)  Ht 5\' 4"  (1.626 m)  Wt 210 lb (95.255 kg)  BMI 36.05 kg/m2  Review of Systems See HPI above.    Objective:   Physical Exam Gen: NAD  R hand/wrist: No gross deformity, swelling, bruising, warmth. TTP proximal dorsal hand but not in snuffbox - within extensor digitorum tendons.  Also TTP APL/EPB tendons distal to radial styloid.  No focal radius, ulna, other bony TTP. FROM fingers at DIP, PIP, MCPs and wrist. Mild pain with resisted 3rd finger extension.  No pain with resisted wrist flexion/extension. Finkelsteins equivocal. NVI distally with < 3 sec cap refill.  MSK u/s right dorsal hand/wrist: Increased fluid surrounding APL/EPB tendons of first compartment, mild increased fluid with neovascularity deep to extensor digitorum tendons.  No evidence of fracture of scaphoid, metacarpals.  Tendons of 6 dorsal compartments appear intact in long and transverse views.    Assessment & Plan:  1.  Right hand/wrist pain - 2/2 bony contusions and traumatic tendinopathy of 1st and 4th dorsal compartments.  Patient reassured -no evidence of fracture on x-rays or ultrasound.  Dorsal tendons also appear intact on long and transverse views.  Should continue  to improve with time, icing, nsaids as needed.  Thumb spica splint to wear outside of work to help rest the areas.  If not improving over next month, would consider physical therapy.

## 2011-01-18 DIAGNOSIS — M79641 Pain in right hand: Secondary | ICD-10-CM | POA: Insufficient documentation

## 2011-01-18 NOTE — Assessment & Plan Note (Signed)
2/2 bony contusions and traumatic tendinopathy of 1st and 4th dorsal compartments.  Patient reassured -no evidence of fracture on x-rays or ultrasound.  Dorsal tendons also appear intact on long and transverse views.  Should continue to improve with time, icing, nsaids as needed.  Thumb spica splint to wear outside of work to help rest the areas.  If not improving over next month, would consider physical therapy.

## 2011-02-01 ENCOUNTER — Other Ambulatory Visit: Payer: Self-pay | Admitting: Obstetrics and Gynecology

## 2011-02-01 DIAGNOSIS — Z1231 Encounter for screening mammogram for malignant neoplasm of breast: Secondary | ICD-10-CM

## 2011-02-05 ENCOUNTER — Ambulatory Visit (HOSPITAL_COMMUNITY)
Admission: RE | Admit: 2011-02-05 | Discharge: 2011-02-05 | Disposition: A | Discharge status: Home / Self Care | Payer: 59 | Source: Ambulatory Visit | Attending: Obstetrics and Gynecology | Admitting: Obstetrics and Gynecology

## 2011-02-05 DIAGNOSIS — Z1231 Encounter for screening mammogram for malignant neoplasm of breast: Secondary | ICD-10-CM | POA: Insufficient documentation

## 2011-06-07 ENCOUNTER — Ambulatory Visit (INDEPENDENT_AMBULATORY_CARE_PROVIDER_SITE_OTHER)
Admission: RE | Admit: 2011-06-07 | Discharge: 2011-06-07 | Disposition: A | Discharge status: Home / Self Care | Payer: 59 | Source: Ambulatory Visit | Attending: Internal Medicine | Admitting: Internal Medicine

## 2011-06-07 ENCOUNTER — Ambulatory Visit (INDEPENDENT_AMBULATORY_CARE_PROVIDER_SITE_OTHER): Payer: 59 | Admitting: Internal Medicine

## 2011-06-07 ENCOUNTER — Encounter: Payer: Self-pay | Admitting: Internal Medicine

## 2011-06-07 ENCOUNTER — Telehealth: Payer: Self-pay | Admitting: Pulmonary Disease

## 2011-06-07 VITALS — BP 120/74 | HR 66 | Ht 64.0 in | Wt 215.0 lb

## 2011-06-07 DIAGNOSIS — J40 Bronchitis, not specified as acute or chronic: Secondary | ICD-10-CM

## 2011-06-07 DIAGNOSIS — J45901 Unspecified asthma with (acute) exacerbation: Secondary | ICD-10-CM

## 2011-06-07 DIAGNOSIS — J209 Acute bronchitis, unspecified: Secondary | ICD-10-CM | POA: Insufficient documentation

## 2011-06-07 MED ORDER — LEVOFLOXACIN 500 MG PO TABS: 500.0000 mg | ORAL_TABLET | Freq: Every day | ORAL | Status: AC

## 2011-06-07 MED ORDER — TRAMADOL HCL 50 MG PO TABS: ORAL_TABLET | ORAL | Status: DC

## 2011-06-07 NOTE — Patient Instructions (Addendum)
Order- CXR  Dx bronchitis  Script Levaquin for an additional 7 days  Neb xop 0.63  Depo 80  An otc probiotic like Align, Flora-Q, or Activia yogurt may help your stomach deal with the antibiotics  Script for tramadol- you can try for cough. This is a"near-narcotic" and may have similar side effects for you. Try 1/2 tab initially, after a meal.

## 2011-06-07 NOTE — Telephone Encounter (Signed)
ATC NA wcb 

## 2011-06-07 NOTE — Progress Notes (Signed)
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 Had flu shot. Onset of a head cold moving into her sinuses. On October 30 she got a Z-Pak and benzonatate Perles. That helped initially. On November 15 she was sent doxycycline and guaifenesin with codeine. On December 12 she was sent Levaquin 500 mg of which she has 1 left, a codeine cough syrup and continued use of the Perles. She still complains of frontal sinus pressure extending into the right ear. She coughs some green sputum. Denies fever or muscle aches.  ROS-see HPI Constitutional:   No-   weight loss, night sweats, fevers, chills, +fatigue, lassitude. HEENT:   + headaches, No-difficulty swallowing, tooth/dental problems, +sore throat,       No-  sneezing, itching, ear ache, nasal congestion, post nasal drip,  CV:  No-   chest pain, orthopnea, PND, swelling in lower extremities, anasarca,                                  dizziness, palpitations Resp: No-   shortness of breath with exertion or at rest.              +  productive cough,  No non-productive cough,  No- coughing up of blood.              +   change in color of mucus.  No- wheezing.   Skin: No-   rash or lesions. GI:  No-   heartburn, indigestion, abdominal pain, nausea, vomiting, diarrhea,                 change in bowel habits, loss of appetite GU: MS:  No-   joint pain or swelling.  No- decreased range of motion.  No- back pain. Neuro-     nothing unusual Psych:   OBJ General- Alert, Oriented, Affect-appropriate, Distress- none acute Skin- rash-none, lesions- none, excoriation- none Lymphadenopathy- none Head- atraumatic            Eyes- Gross vision intact, PERRLA, conjunctivae clear secretions            Ears- TMs retracted            Nose- Sniffing, nose tender, no-Septal dev, mucus, polyps, erosion, perforation             Throat- Mallampati II , mucosa clear , drainage- none, tonsils- atrophic Neck-  flexible , trachea midline, no stridor , thyroid nl, carotid no bruit Chest - symmetrical excursion , unlabored           Heart/CV- RRR , no murmur , no gallop  , no rub, nl s1 s2                           - JVD- none , edema- none, stasis changes- none, varices- none           Lung- clear to P&A, wheeze- none, +dry cough , dullness-none, rub- none           Chest wall-  Abd- tender-no, distended-no, bowel sounds-present, HSM- no Br/ Gen/ Rectal- Not done, not indicated Extrem- cyanosis- none, clubbing, none, atrophy- none, strength- nl Neuro- grossly intact to observation

## 2011-06-07 NOTE — Telephone Encounter (Signed)
Pt returned call & can be reached at [phone].  Marissa [name]

## 2011-06-07 NOTE — Telephone Encounter (Signed)
I spoke with pt and she states she has had bronchitis since then end of October. Pt is currently on her 3rd round of abx and will finish Levaquin tomorrow. Pt c/o cough with thick green phlem, nasal congestion, PND. Pt denies any fever, nausea, vomiting. Pt is requesting an apt to be seen today. Pt is scheduled to see CDY today at 2:30 for an evaluation

## 2011-06-08 ENCOUNTER — Telehealth: Payer: Self-pay | Admitting: Pulmonary Disease

## 2011-06-08 NOTE — Telephone Encounter (Signed)
I spoke with patient about results and she verbalized understanding and had no questions 

## 2011-06-08 NOTE — Telephone Encounter (Signed)
CXR- looks normal with no active disease seen.--lmomtcb

## 2011-06-08 NOTE — Assessment & Plan Note (Signed)
Initially a viral syndrome with upper respiratory infection with bronchitis. She may now be developing bacterial sinusitis and bronchitis. Put emphasis on hydration decongestion and antibiotics. Chest x-ray rule out pneumonia. Plan- CXR  -Nebulizer -depo 80 -levaquin 500 mg x 7 days more. Use a probiotic if needed while on the antibiotic

## 2011-06-28 ENCOUNTER — Ambulatory Visit (INDEPENDENT_AMBULATORY_CARE_PROVIDER_SITE_OTHER): Payer: 59 | Admitting: Pulmonary Disease

## 2011-06-28 ENCOUNTER — Encounter: Payer: Self-pay | Admitting: Pulmonary Disease

## 2011-06-28 DIAGNOSIS — G4733 Obstructive sleep apnea (adult) (pediatric): Secondary | ICD-10-CM

## 2011-06-28 DIAGNOSIS — J45909 Unspecified asthma, uncomplicated: Secondary | ICD-10-CM

## 2011-06-28 DIAGNOSIS — J45901 Unspecified asthma with (acute) exacerbation: Secondary | ICD-10-CM

## 2011-06-28 MED ORDER — PREDNISONE 10 MG PO TABS: ORAL_TABLET | ORAL | Status: DC

## 2011-06-28 NOTE — Assessment & Plan Note (Signed)
The patient is currently having ongoing symptoms of nasal and chest congestion, and also some increased shortness of breath.  I suspect she has increased airway inflammation, and will need a course of prednisone to get her through this.  I would also like to check a scan of her sinuses to rule out chronic sinusitis as the etiology for her recurrent sinopulmonary infections over the last one year.

## 2011-06-28 NOTE — Progress Notes (Signed)
  Subjective:    Patient ID: Marissa [name], female    DOB: 1960/08/26, 51 y.o.   MRN: [id number]  HPI Patient comes in today for an acute sick visit.  She has known asthma diagnosed on methacholine challenge testing, and also obstructive sleep apnea.  She has not been very compliant with CPAP, and primarily blames this on having recurrent sinopulmonary infections.  However, she has not seen me in a year.  The patient notes her inhaler has really not changed her breathing much, and I've explained that a lot of her dyspnea on exertion may be due to her weight and conditioning.  The patient has had at least 3 episodes of severe sinus congestion and purulence which then goes into her chest over the last one year.  The last was over the last 2 months, and she was treated with an antibiotic and steroid injection at her last visit here with Dr. Maple Hudson.  She had some mild improvement, but continues to be quite congested and blowing discolored mucous from her nose.  She is also having sinus pressure.  She has seen some increasing shortness of breath, as well as chest tightness.   Review of Systems  Constitutional: Negative for fever and unexpected weight change.  HENT: Positive for congestion and sneezing. Negative for ear pain, nosebleeds, sore throat, rhinorrhea, trouble swallowing, dental problem, postnasal drip and sinus pressure.   Eyes: Negative for redness and itching.  Respiratory: Positive for cough, chest tightness and shortness of breath. Negative for wheezing.   Cardiovascular: Negative for palpitations and leg swelling.  Gastrointestinal: Negative for nausea and vomiting.  Genitourinary: Negative for dysuria.  Musculoskeletal: Negative for joint swelling.  Skin: Negative for rash.  Neurological: Negative for headaches.  Hematological: Does not bruise/bleed easily.  Psychiatric/Behavioral: Negative for dysphoric mood. The patient is not nervous/anxious.        Objective:   Physical  Exam Obese female in no acute distress Nose with mild edema and very erythematous mucosa. Oropharynx clear Chest with mild decrease in breath sounds, but no wheezes or rhonchi Cardiac exam is regular rate and rhythm Lower extremities without edema, no cyanosis noted Alert and oriented, moves all 4 extremities.       Assessment & Plan:

## 2011-06-28 NOTE — Patient Instructions (Signed)
Will treat with a short course of prednisone Will check scan of sinuses this week, and I will call you with results. Do sinus rinses twice a day for the next one to two weeks. Go ahead and try nasal mask with chin strap, and let us know if things are going well so we can get a 2 week download to optimize your pressure Work on weight loss.  followup with me in 6mos if things sort out and are going well.

## 2011-06-28 NOTE — Assessment & Plan Note (Signed)
The patient has been wearing CPAP only intermittently, and she blames this on recurrence on her pulmonary infections.  She also has a difficult time tolerating a full facemask, and would like to try a nasal mask with a chinstrap.  She also has not had her pressure optimized, and this will need to be done when she is wearing CPAP on a regular basis.  I would like to get her through her current infection/pulmonary issue, try her on a nasal mask with chin strap, and we'll do a 2 week download on auto once she is wearing consistently.

## 2011-06-29 ENCOUNTER — Telehealth: Payer: Self-pay | Admitting: Pulmonary Disease

## 2011-06-29 ENCOUNTER — Ambulatory Visit (INDEPENDENT_AMBULATORY_CARE_PROVIDER_SITE_OTHER)
Admission: RE | Admit: 2011-06-29 | Discharge: 2011-06-29 | Disposition: A | Discharge status: Home / Self Care | Payer: 59 | Source: Ambulatory Visit | Attending: Pulmonary Disease | Admitting: Pulmonary Disease

## 2011-06-29 DIAGNOSIS — J45909 Unspecified asthma, uncomplicated: Secondary | ICD-10-CM

## 2011-06-29 DIAGNOSIS — J45901 Unspecified asthma with (acute) exacerbation: Secondary | ICD-10-CM

## 2011-06-29 DIAGNOSIS — J209 Acute bronchitis, unspecified: Secondary | ICD-10-CM

## 2011-06-29 NOTE — Telephone Encounter (Signed)
Pt returned call. Marissa [name]  

## 2011-06-29 NOTE — Telephone Encounter (Signed)
I spoke with pt and explained to her the directions for the prednisone taper. She voiced her understanding and had no questions

## 2011-06-29 NOTE — Telephone Encounter (Signed)
lmomtcb x1 

## 2011-07-02 ENCOUNTER — Telehealth: Payer: Self-pay | Admitting: Pulmonary Disease

## 2011-07-02 MED ORDER — FLUTICASONE PROPIONATE 50 MCG/ACT NA SUSP: NASAL | Status: DC

## 2011-07-02 NOTE — Telephone Encounter (Signed)
I spoke with patient about results and she verbalized understanding and had no questions. Pt is aware of directions for flonase, zyrtec, and sinus rinse. She voiced her understanding and had no questions

## 2011-07-29 ENCOUNTER — Encounter: Payer: Self-pay | Admitting: Adult Health

## 2011-07-29 ENCOUNTER — Ambulatory Visit (INDEPENDENT_AMBULATORY_CARE_PROVIDER_SITE_OTHER): Payer: 59 | Admitting: Adult Health

## 2011-07-29 VITALS — BP 122/60 | HR 64 | Temp 98.1°F | Ht 64.0 in | Wt 225.6 lb

## 2011-07-29 DIAGNOSIS — J45909 Unspecified asthma, uncomplicated: Secondary | ICD-10-CM

## 2011-07-29 MED ORDER — PHENYLEPH-PROMETHAZINE-COD 5-6.25-10 MG/5ML PO SYRP: 5.0000 mL | ORAL_SOLUTION | ORAL | Status: DC | PRN

## 2011-07-29 MED ORDER — BUDESONIDE-FORMOTEROL FUMARATE 160-4.5 MCG/ACT IN AERO: 2.0000 | INHALATION_SPRAY | Freq: Two times a day (BID) | RESPIRATORY_TRACT | Status: DC

## 2011-07-29 NOTE — Progress Notes (Signed)
  Subjective:    Patient ID: Marissa [name], female    DOB: 1960/12/19, 51 y.o.   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 Pt states this has been going on off and on for 4 months. She has been on more than 5 abx and steroid tapers . Every time she finshes abx /steroids she gets better for short time then symptoms come right back. She never gets completely better  CT last month was neg for acute sinus dz. CXR in 05/2011 with no acute process. She does not have pets. No new meds or travel. No unusual hobbies.    Review of Systems Constitutional:   No  weight loss, night sweats,  Fevers, chills, fatigue, or  lassitude.  HEENT:   No headaches,  Difficulty swallowing,  Tooth/dental problems, or  Sore throat,                No sneezing, itching, ear ache,  +nasal congestion, post nasal drip,   CV:  No chest pain,  Orthopnea, PND, swelling in lower extremities, anasarca, dizziness, palpitations, syncope.   GI  No heartburn, indigestion, abdominal pain, nausea, vomiting, diarrhea, change in bowel habits, loss of appetite, bloody stools.   Resp:   No coughing up of blood.   No chest wall deformity  Skin: no rash or lesions.  GU: no dysuria, change in color of urine, no urgency or frequency.  No flank pain, no hematuria   MS:  No joint pain or swelling.  No decreased range of motion.  No back pain.  Psych:  No change in mood or affect. No depression or anxiety.  No memory loss.          Objective:   Physical Exam GEN: A/Ox3; pleasant , NAD, well nourished   HEENT:  [place]/AT,  EACs-clear, TMs-wnl, NOSE-clear, THROAT-clear, no lesions, no postnasal drip or exudate noted.   NECK:  Supple w/ fair ROM; no JVD; normal  carotid impulses w/o bruits; no thyromegaly or nodules palpated; no lymphadenopathy.  RESP  Clear  P & A; w/o, wheezes/ rales/ or rhonchi.no accessory muscle use, no dullness to percussion  CARD:  RRR, no m/r/g  , no peripheral edema, pulses intact, no cyanosis or clubbing.  GI:   Soft & nt; nml bowel sounds; no organomegaly or masses detected.  Musco: Warm bil, no deformities or joint swelling noted.   Neuro: alert, no focal deficits noted.    Skin: Warm, no lesions or rashes         Assessment & Plan:

## 2011-07-29 NOTE — Assessment & Plan Note (Signed)
Recurrent asthma flare with suspected rhinitis flare ? Cyclical cough  PlaN:  Begin Symbicort 160/4.26mcg 2 puffs Twice daily  -brush/rinse and gargle after use Delsym 2 tsp every 12hr  For cough  Phenergan w/ codeine 1-2 tsp every 4 hr as needed for cough , may make you sleepy.  Zyrtec 10mg  in am .  Begin Chlrophenramine 4mg  2 tabs at bedtime .  Use sugarless candy , ice chips and sips of water to avoid and throat clearing.  NO MINTS  GERD Diet  Zegerid daily  follow up Dr. Shelle Iron in 2 weeks  Please contact office for sooner follow up if symptoms do not improve or worsen or seek emergency care

## 2011-07-29 NOTE — Patient Instructions (Addendum)
Begin Symbicort 160/4.70mcg 2 puffs Twice daily  -brush/rinse and gargle after use Delsym 2 tsp every 12hr  For cough  Phenergan w/ codeine 1-2 tsp every 4 hr as needed for cough , may make you sleepy.  Zyrtec 10mg  in am .  Begin Chlrophenramine 4mg  2 tabs at bedtime .  Use sugarless candy , ice chips and sips of water to avoid and throat clearing.  NO MINTS  GERD Diet  Zegerid daily  follow up Dr. Shelle Iron in 2 weeks  Please contact office for sooner follow up if symptoms do not improve or worsen or seek emergency care

## 2011-08-02 NOTE — Progress Notes (Signed)
Acute visit reviewed, and agree with plan as outlined.  

## 2011-08-16 ENCOUNTER — Other Ambulatory Visit: Payer: 59

## 2011-08-16 ENCOUNTER — Encounter: Payer: Self-pay | Admitting: Pulmonary Disease

## 2011-08-16 ENCOUNTER — Ambulatory Visit (INDEPENDENT_AMBULATORY_CARE_PROVIDER_SITE_OTHER): Payer: 59 | Admitting: Pulmonary Disease

## 2011-08-16 DIAGNOSIS — J45909 Unspecified asthma, uncomplicated: Secondary | ICD-10-CM

## 2011-08-16 DIAGNOSIS — R05 Cough: Secondary | ICD-10-CM | POA: Insufficient documentation

## 2011-08-16 NOTE — Patient Instructions (Addendum)
Will change your symbicort to dulera 100/5  2 inhalations am and pm everyday.  Keep mouth rinsed.  This inhaler has smaller particle size that is less likely to irritate your mouth Will check bloodwork today for allergies. Increase zegerid to am and pm for now, and stop taking fish oil until we completely resolve your cough.  Stay on zyrtec everyday You must avoid throat clearing.  Use hard candy as we discussed to help with this.  Will call you with results of your allergy testing and go from there.

## 2011-08-16 NOTE — Assessment & Plan Note (Signed)
I suspect the patient's ongoing cough is due to upper airway irritation from postnasal drip, and possibly reflux.  I would like to do allergy testing, and will also intensify her reflux regimen.  I have also discussed with her the behavioral therapies for cyclical coughing.  I initially thought her cough may be due to her occult asthma, but it has persisted despite treating aggressively.

## 2011-08-16 NOTE — Progress Notes (Signed)
  Subjective:    Patient ID: Marissa [name], female    DOB: 1960-08-08, 51 y.o.   MRN: [id number]  HPI Patient comes in today for an acute sick visit.  She has known asthma documented by methacholine challenge testing, but also has had an ongoing issue with nasal congestion and postnasal drip as well as chronic cough.  She has had a sinus CT which does not show acute or chronic sinusitis, but has not had allergy testing.  She has had a chronic cough which really sounds more upper airway in origin, and is clearing her throat continuously during our visit today.  She complains of some chest pressure as well as dyspnea on exertion despite staying on symbicort compliantly.  She is having some issues with mouth irritation from the symbicort despite rinsing well.  She has not been treated more aggressively for possible laryngopharyngeal reflux.   Review of Systems  Constitutional: Negative for fever and unexpected weight change.  HENT: Positive for rhinorrhea and postnasal drip. Negative for ear pain, nosebleeds, congestion, sore throat, sneezing, trouble swallowing, dental problem and sinus pressure.   Eyes: Positive for pain. Negative for redness and itching.  Respiratory: Positive for cough, shortness of breath and wheezing. Negative for chest tightness.   Cardiovascular: Negative for palpitations and leg swelling.  Gastrointestinal: Negative for nausea and vomiting.  Genitourinary: Negative for dysuria.  Musculoskeletal: Negative for joint swelling.  Skin: Negative for rash.  Neurological: Negative for headaches.  Hematological: Does not bruise/bleed easily.  Psychiatric/Behavioral: Negative for dysphoric mood. The patient is not nervous/anxious.        Objective:   Physical Exam Obese female in nad +++continuous throat clearing OP clear, no thrush Nares with mildly inflammed mucosa, no purulence.  Chest with clear bs throughout Cor with rrr LE without edema, no cyanosis Alert and  oriented, moves all 4.        Assessment & Plan:

## 2011-08-16 NOTE — Assessment & Plan Note (Signed)
The patient has been staying on a LABA/ICS, and feels this has not changed her breathing very much.  I suspect her dyspnea on exertion may have nothing to do with her occult asthma.  In fact, I had considered whether her inhaler could be irritating her upper airway.  I do not think her cough is related to her asthma.  I will change her symbicort to dulera for its smaller particles size.

## 2011-08-18 LAB — ALLERGY FULL PROFILE
Alternaria Alternata: 0.53 kU/L — ABNORMAL HIGH (ref <0.35)
Cat Dander: <0.1 kU/L (ref <0.35)
Common Ragweed: 0.22 kU/L (ref <0.35)
Dog Dander: <0.1 kU/L (ref <0.35)
Elm IgE: 0.28 kU/L (ref <0.35)
Goldenrod: 0.2 kU/L (ref <0.35)
House Dust Hollister: 0.14 kU/L (ref <0.35)
Lamb's Quarters: 0.39 kU/L — ABNORMAL HIGH (ref <0.35)
Oak: 0.32 kU/L (ref <0.35)
Plantain: 0.29 kU/L (ref <0.35)
Stemphylium Botryosum: 0.6 kU/L — ABNORMAL HIGH (ref <0.35)
Sycamore Tree: 0.27 kU/L (ref <0.35)
Timothy Grass: 0.3 kU/L (ref <0.35)

## 2011-08-26 ENCOUNTER — Telehealth: Payer: Self-pay | Admitting: Pulmonary Disease

## 2011-08-26 NOTE — Telephone Encounter (Signed)
Called and spoke with pt.  Informed her of RAST results and KC's recs.  Pt scheduled 2 week f/u appt for 09/09/11 at 9:45 am

## 2011-08-26 NOTE — Telephone Encounter (Signed)
Pt calling again in ref to previous msg can be reached at [phone].Marissa [name]

## 2011-09-09 ENCOUNTER — Encounter: Payer: Self-pay | Admitting: Pulmonary Disease

## 2011-09-09 ENCOUNTER — Ambulatory Visit (INDEPENDENT_AMBULATORY_CARE_PROVIDER_SITE_OTHER): Payer: 59 | Admitting: Pulmonary Disease

## 2011-09-09 VITALS — BP 120/82 | HR 71 | Temp 98.4°F | Ht 64.0 in | Wt 231.6 lb

## 2011-09-09 DIAGNOSIS — G4733 Obstructive sleep apnea (adult) (pediatric): Secondary | ICD-10-CM

## 2011-09-09 DIAGNOSIS — R05 Cough: Secondary | ICD-10-CM

## 2011-09-09 DIAGNOSIS — J45909 Unspecified asthma, uncomplicated: Secondary | ICD-10-CM

## 2011-09-09 NOTE — Assessment & Plan Note (Signed)
The patient has reactive airways based on her methacholine challenge test, and I suspect this is due to asthma.  However, cannot exclude the possibility this is simply reactive airways disease associated with upper airway issues.  She has poor tolerance of her inhalers thus far, and I would therefore like to try her on an inhaled corticosteroid alone in the form of Asmanex at bedtime.

## 2011-09-09 NOTE — Progress Notes (Signed)
  Subjective:    Patient ID: Marissa [name], female    DOB: [birth year], 51 y.o.   MRN: [id number]  HPI The patient comes in today for followup of her multiple issues.  She has documented reactive airways by methacholine challenge, a cough that I suspect his upper airway in origin, and also sleep apnea with poor compliance with CPAP.  She is continuing to have issues with her mask fit, and therefore is not wearing CPAP.  She will need to be referred to her D&E for a fitting.  She also has seen no difference in her cough or breathing with 2 layer, and feels that it is continuing to irritate her mouth and throat despite rinsing.  Finally, she continues to have postnasal drip despite Zyrtec, and has not taken b.i.d. Proton pump inhibitor as I asked her since the last visit.   Review of Systems  Constitutional: Negative for fever and unexpected weight change.  HENT: Positive for congestion, rhinorrhea and postnasal drip. Negative for ear pain, nosebleeds, sore throat, sneezing, trouble swallowing, dental problem and sinus pressure.   Eyes: Negative for redness and itching.  Respiratory: Positive for cough, shortness of breath and wheezing. Negative for chest tightness.   Cardiovascular: Negative for palpitations and leg swelling.  Gastrointestinal: Negative for nausea and vomiting.  Genitourinary: Negative for dysuria.  Musculoskeletal: Negative for joint swelling.  Skin: Negative for rash.  Neurological: Negative for headaches.  Hematological: Does not bruise/bleed easily.  Psychiatric/Behavioral: Negative for dysphoric mood. The patient is not nervous/anxious.        Objective:   Physical Exam Obese female in no acute distress Nose without purulence or discharge noted Oropharynx clear Chest totally clear to auscultation, no wheezes or rhonchi Cardiac exam with regular rate and rhythm Lower extremities without edema, cyanosis noted Alert and oriented, moves all 4 extremities.         Assessment & Plan:

## 2011-09-09 NOTE — Assessment & Plan Note (Signed)
The patient continues to wear CPAP noncompliant weight because of issues with her mask fit.  We also have not optimize her pressure yet, and would like to do this once she gets a new mask.  Will refer her to her D&E for mask fitting, and I have also encouraged her to work aggressively on weight loss.

## 2011-09-09 NOTE — Patient Instructions (Addendum)
Will send an order to [facility] to get you a new mask with fitting.  Once you feel comfortable with the mask, and wearing consistently, let me know so we can optimize your pressure.  Will change dulera to asmanex one inhalation each night at bedtime.  Rinse mouth well.  If tolerating this, let us know and will send in prescription. Would consider increasing zegerid to am and pm for 6 weeks to see if reflux is contributing to your upper airway cough.  I understand the cost is higher, but you will need 6weeks to see if this will help Trial of dymista 2 inhalations each nostril each am to see if helps postnasal drip.  Stop zyrtec during this time.  If this continues to be an issue, may have to consider allergy evaluation.  If doing well, will see you back in 4mos.

## 2011-09-09 NOTE — Assessment & Plan Note (Signed)
The patient continues to have a cough that is clearly upper airway in origin.  She is still having postnasal drip despite Zyrtec, and I will give her a trial of a different nasal spray to see if that will help.  I have also asked her to use her proton pump inhibitor b.i.d., but she only took this for a week because of cost.  I have again explained the connection between the rings or pharyngeal reflux an upper airway cough, and asked her to consider getting back on b.i.d. Dosing for a 6 week period.  If she continues to have issues with postnasal drip and cough, I would consider an allergy evaluation or skin testing.

## 2011-10-07 ENCOUNTER — Telehealth: Payer: Self-pay | Admitting: Pulmonary Disease

## 2011-10-07 NOTE — Telephone Encounter (Signed)
LMOM for pt TCB 

## 2011-10-08 NOTE — Telephone Encounter (Signed)
lmomtcb  

## 2011-10-11 NOTE — Telephone Encounter (Signed)
Pcc, can you talk with her about this.  Thanks.

## 2011-10-11 NOTE — Telephone Encounter (Signed)
LMOM TCB x3

## 2011-10-11 NOTE — Telephone Encounter (Signed)
Pt states she is not happy with service from [facility] and wants to switch to [facility]. An order was sent last week for a new mask fitting. Pls advise if okay to send this order to [facility].

## 2011-10-12 NOTE — Telephone Encounter (Signed)
Pt returned [name]'s call [phone].  Marissa [name]

## 2011-10-12 NOTE — Telephone Encounter (Signed)
lmtcb

## 2011-10-12 NOTE — Telephone Encounter (Signed)
Order faxed to [facility] for pt to start getting cpap supplies with them pt is aware Marissa [name]

## 2012-01-13 ENCOUNTER — Ambulatory Visit: Payer: 59 | Admitting: Pulmonary Disease

## 2012-03-21 ENCOUNTER — Other Ambulatory Visit: Payer: Self-pay | Admitting: Obstetrics and Gynecology

## 2012-03-21 DIAGNOSIS — Z1231 Encounter for screening mammogram for malignant neoplasm of breast: Secondary | ICD-10-CM

## 2012-04-18 ENCOUNTER — Ambulatory Visit (HOSPITAL_COMMUNITY)
Admission: RE | Admit: 2012-04-18 | Discharge: 2012-04-18 | Disposition: A | Discharge status: Home / Self Care | Payer: 59 | Source: Ambulatory Visit | Attending: Obstetrics and Gynecology | Admitting: Obstetrics and Gynecology

## 2012-04-18 DIAGNOSIS — Z1231 Encounter for screening mammogram for malignant neoplasm of breast: Secondary | ICD-10-CM | POA: Insufficient documentation

## 2012-04-20 ENCOUNTER — Other Ambulatory Visit: Payer: Self-pay | Admitting: Obstetrics and Gynecology

## 2012-04-20 DIAGNOSIS — R928 Other abnormal and inconclusive findings on diagnostic imaging of breast: Secondary | ICD-10-CM

## 2012-04-28 ENCOUNTER — Ambulatory Visit
Admission: RE | Admit: 2012-04-28 | Discharge: 2012-04-28 | Disposition: A | Discharge status: Home / Self Care | Payer: 59 | Source: Ambulatory Visit | Attending: Obstetrics and Gynecology | Admitting: Obstetrics and Gynecology

## 2012-04-28 DIAGNOSIS — R928 Other abnormal and inconclusive findings on diagnostic imaging of breast: Secondary | ICD-10-CM

## 2012-04-28 LAB — HM MAMMOGRAPHY

## 2012-08-14 IMAGING — CT CT PARANASAL SINUSES LIMITED
1 of 2 series · 16 of 19 positions shown, 20 images · non-contrast
Comparison: None.

CLINICAL DATA: 50-year-old female with bronchitis, sinus drainage,
cough, congestion, multiple rounds of antibiotics.

CT LIMITED SINUSES WITHOUT CONTRAST
TECHNIQUE: Multidetector CT images of the paranasal sinuses were
obtained in a single plane without contrast.

[Series 4: ltd sinus 3.0 h30s · axial · 0.35mm/px · z∈[-105,-10]mm · 16 of 18 slices shown, 20 images]
[im 2/18  brain]
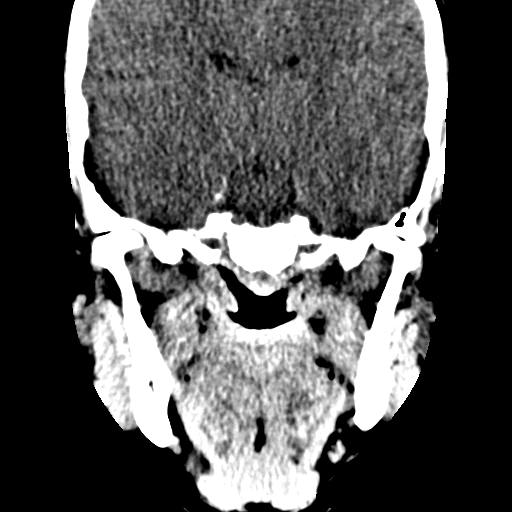
[im 2/18  bone]
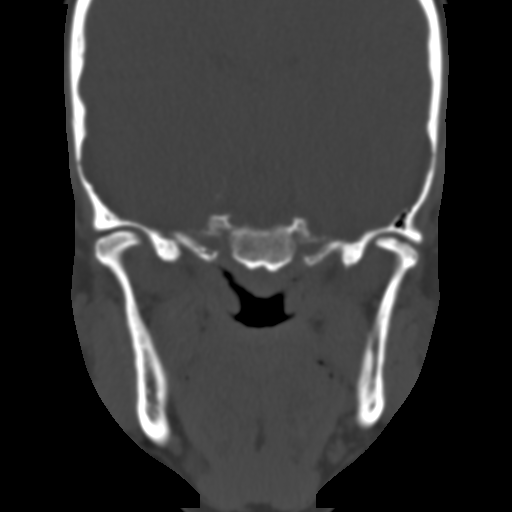
[im 3/18  bone]
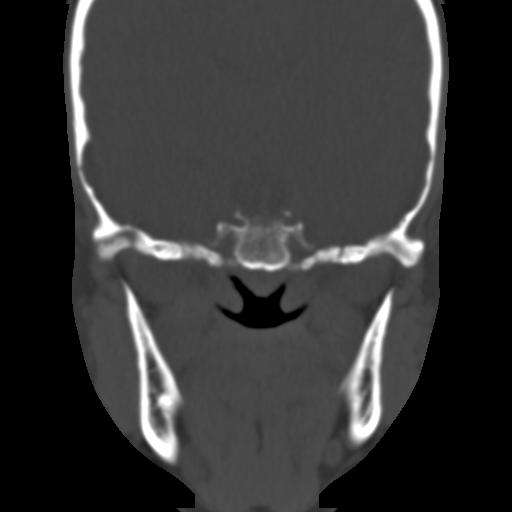
[im 4/18  bone]
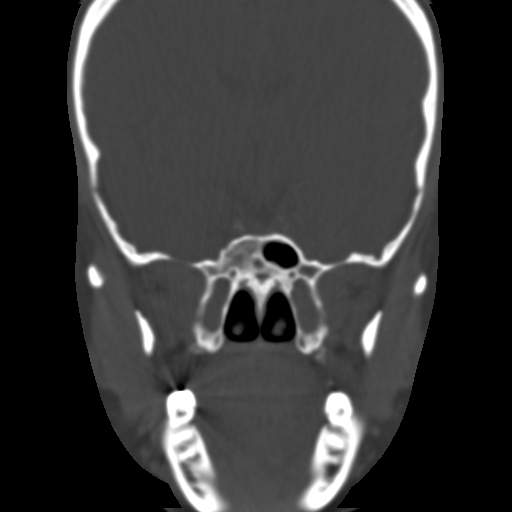
[im 5/18  bone]
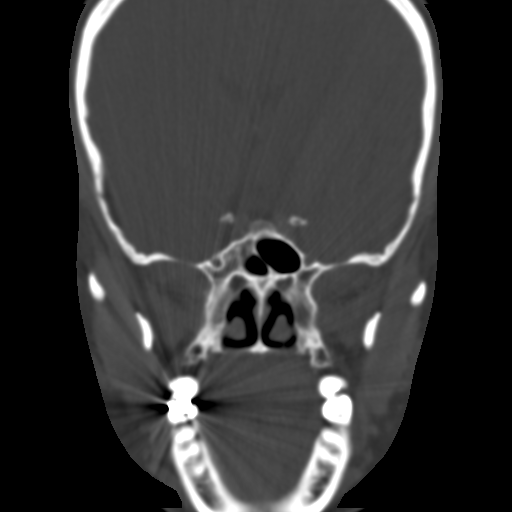
[im 6/18  brain]
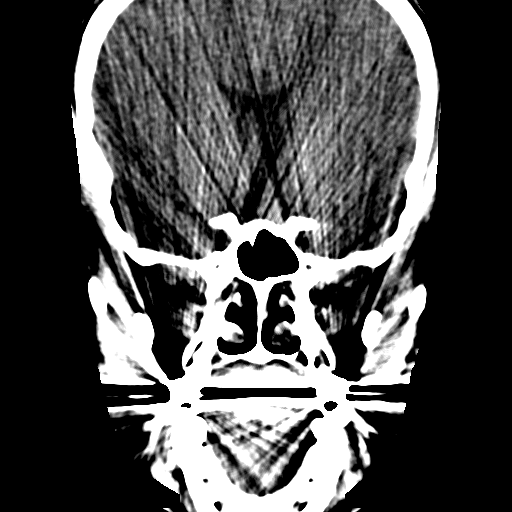
[im 6/18  bone]
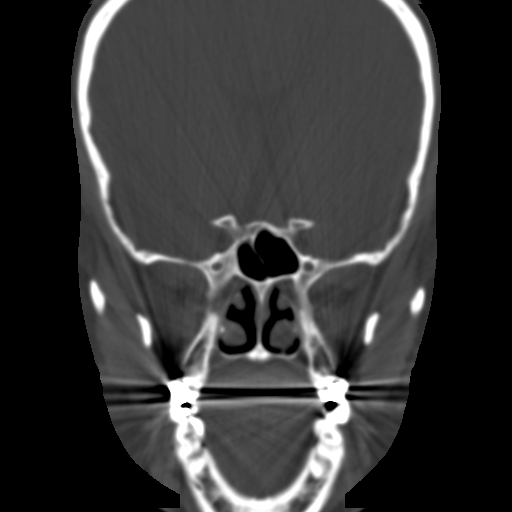
[im 7/18  bone]
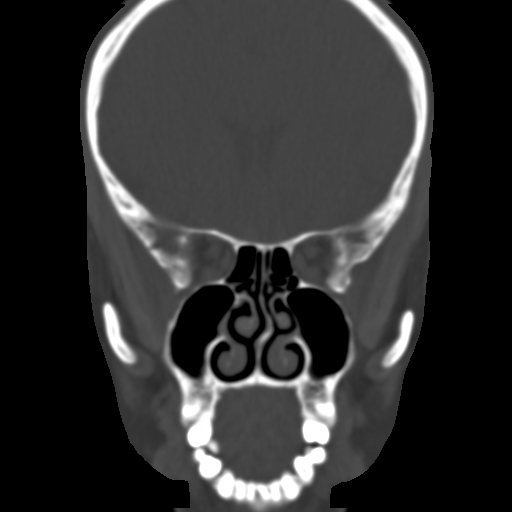
[im 8/18  bone]
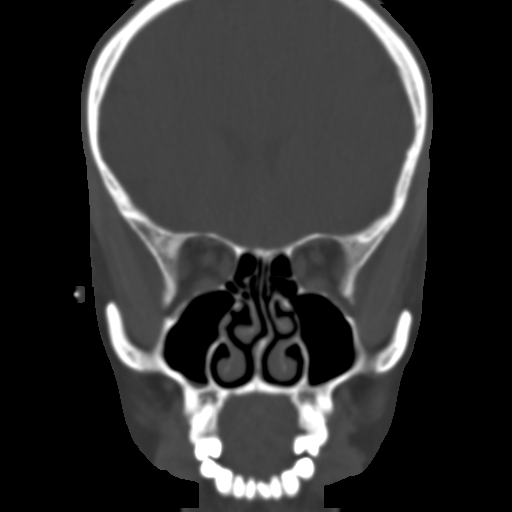
[im 9/18  bone]
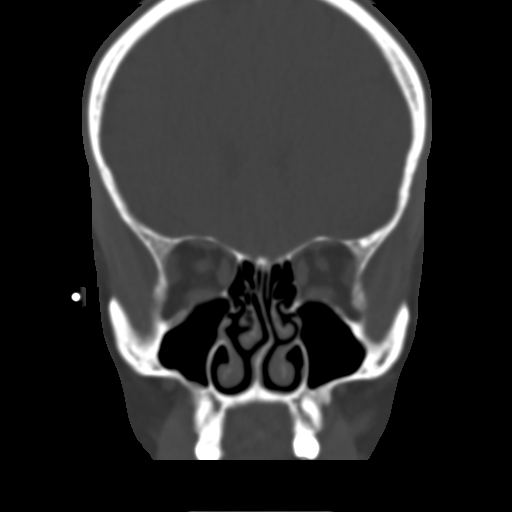
[im 10/18  brain]
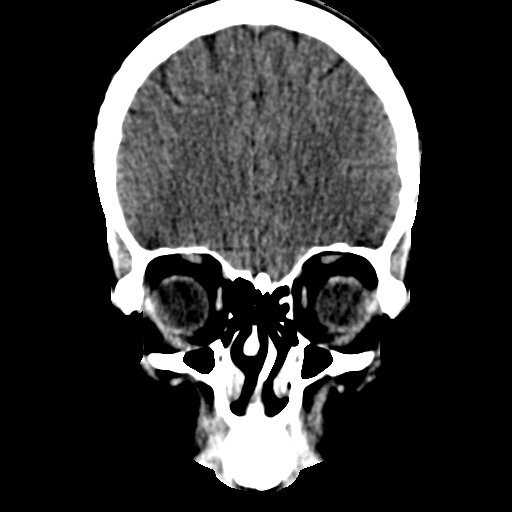
[im 10/18  bone]
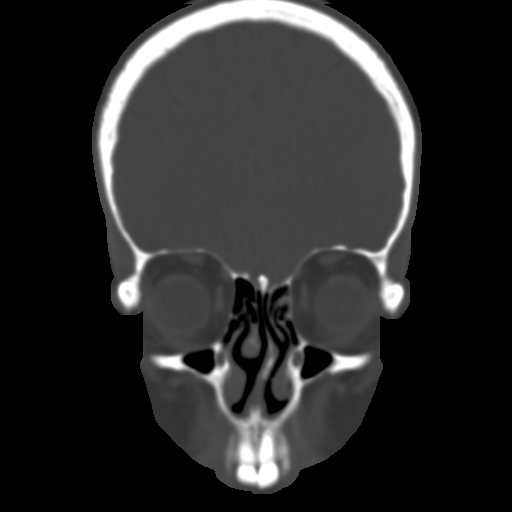
[im 11/18  bone]
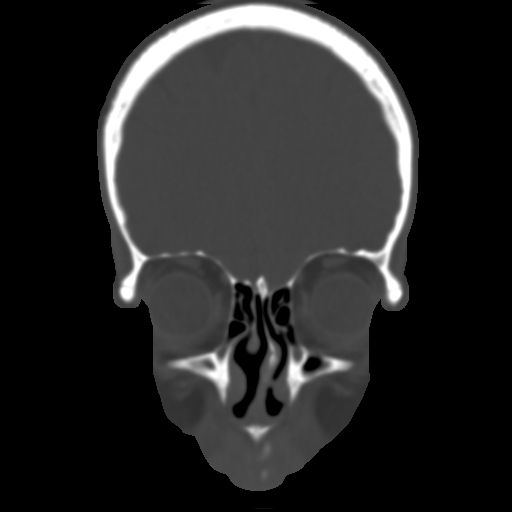
[im 12/18  bone]
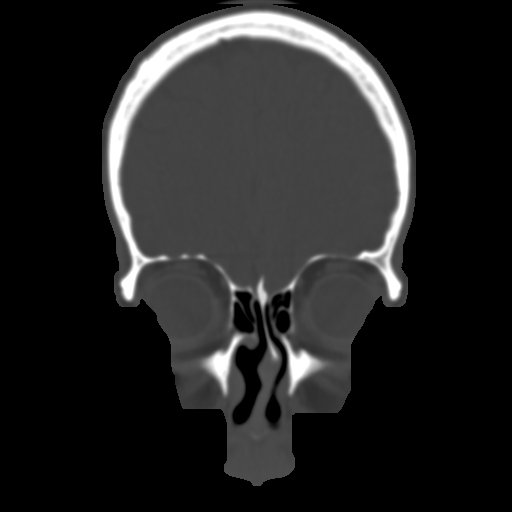
[im 13/18  bone]
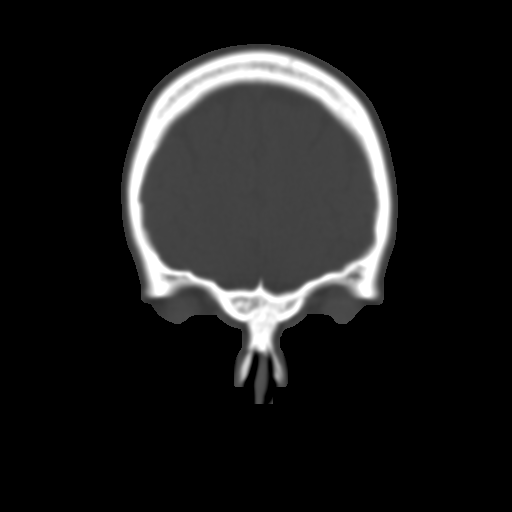
[im 14/18  brain]
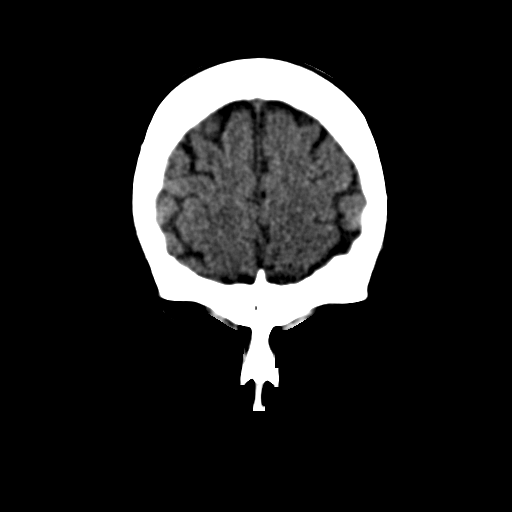
[im 14/18  bone]
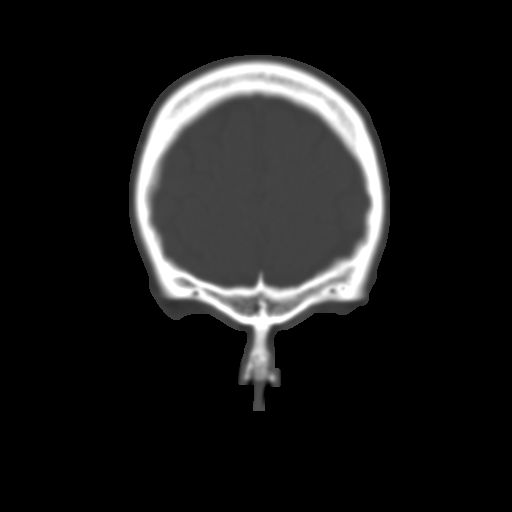
[im 15/18  bone]
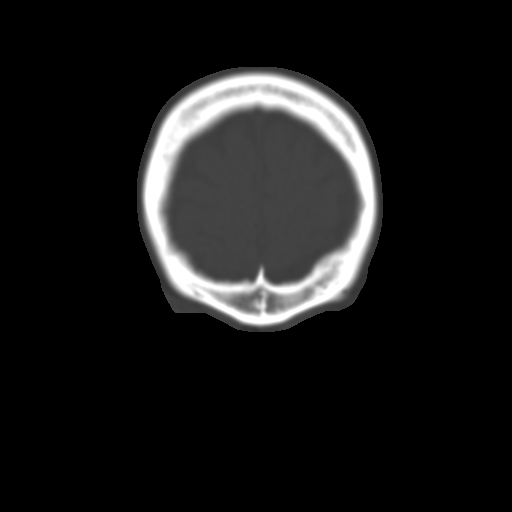
[im 16/18  bone]
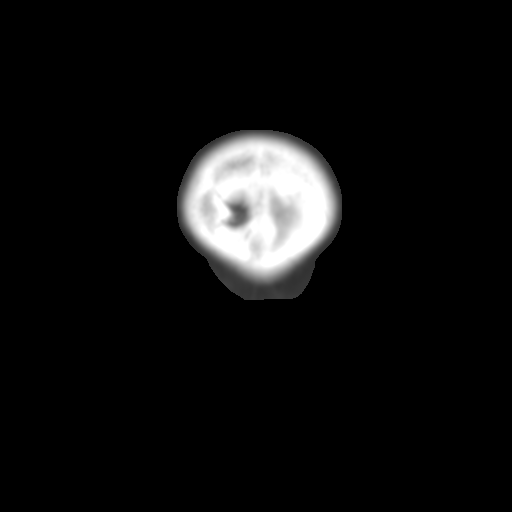
[im 17/18  bone]
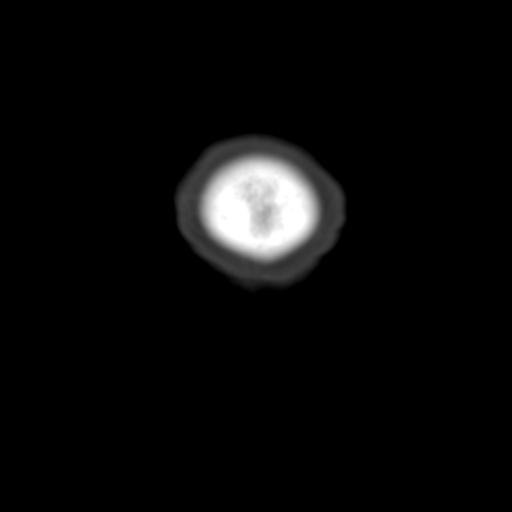

[16 of 19 positions shown; findings below may reference images not displayed]

FINDINGS: Negative visualized noncontrast brain parenchyma.
Negative visualized face and orbit soft tissues.

The sphenoid sinuses are clear.
The ethmoid air cells are clear.
The frontal sinuses did not develop.
There is minimal mucosal thickening in the right maxillary sinus,
otherwise both maxillary sinuses are clear.

Leftward nasal septal deviation.  There could be mild nasal cavity
mucosal thickening.

No acute osseous abnormality identified.
IMPRESSION: Negative paranasal sinuses.

## 2012-09-13 ENCOUNTER — Telehealth: Payer: Self-pay | Admitting: Nurse Practitioner

## 2012-09-13 NOTE — Telephone Encounter (Signed)
Patient given appt. With Dr. Farrel Gobble for 09/15/2012 @ 8:30am. Patient understands will be seen only for  Symptom of vaginal area.

## 2012-09-13 NOTE — Telephone Encounter (Signed)
Left message on CB# of need to call our office to schedule appt. . [id number]

## 2012-09-13 NOTE — Telephone Encounter (Signed)
Pt has a bump that she wants to get checked out in her genital area. Pt also needs her annual exam. Her last AEX was 03/01/11.

## 2012-09-14 ENCOUNTER — Encounter: Payer: Self-pay | Admitting: Gynecology

## 2012-09-15 ENCOUNTER — Encounter: Payer: Self-pay | Admitting: Gynecology

## 2012-09-15 ENCOUNTER — Ambulatory Visit (INDEPENDENT_AMBULATORY_CARE_PROVIDER_SITE_OTHER): Payer: 59 | Admitting: Gynecology

## 2012-09-15 VITALS — BP 116/76 | Wt 236.0 lb

## 2012-09-15 DIAGNOSIS — N907 Vulvar cyst: Secondary | ICD-10-CM

## 2012-09-15 DIAGNOSIS — N9489 Other specified conditions associated with female genital organs and menstrual cycle: Secondary | ICD-10-CM

## 2012-09-15 NOTE — Patient Instructions (Signed)
Sitz baths with gentle pill rolling of lesion to drain, avoid sex while lesion exists

## 2012-09-15 NOTE — Progress Notes (Signed)
Pt presents with 1 week history of a vulvar lesion.  Pt states that she noticed in the shower. Pt has not been sexually active recently as boyfriend has had recent medical issues.  Partner of 7y.  Pt denied dyspareunia.No fever, chills or other symptoms. General ROS: negative per HPI Physical Examination: General appearance - alert, well appearing, and in no distress and oriented to person, place, and time Pelvic -   VULVA:, vulvar lesion approx 0.5cm cystic, mobile, no associated erythema induartion or warmth at 1o'clock , remaining vulva unremarkable Lymph Nodes-negative  Assessment; Labial sebaceous cyst  Plan: Pelvic rest Sitz baths to express To call if develops fever or pain Reminded overdue for annual, had questions regarding recent mammogram, will get records and re-evaluate-agrees

## 2012-10-05 ENCOUNTER — Encounter: Payer: Self-pay | Admitting: Gynecology

## 2012-10-05 ENCOUNTER — Telehealth: Payer: Self-pay | Admitting: *Deleted

## 2012-10-05 ENCOUNTER — Ambulatory Visit (INDEPENDENT_AMBULATORY_CARE_PROVIDER_SITE_OTHER): Payer: 59 | Admitting: Gynecology

## 2012-10-05 VITALS — BP 106/70 | Ht 64.5 in | Wt 234.0 lb

## 2012-10-05 DIAGNOSIS — Z0000 Encounter for general adult medical examination without abnormal findings: Secondary | ICD-10-CM

## 2012-10-05 DIAGNOSIS — Z01419 Encounter for gynecological examination (general) (routine) without abnormal findings: Secondary | ICD-10-CM

## 2012-10-05 DIAGNOSIS — N63 Unspecified lump in unspecified breast: Secondary | ICD-10-CM

## 2012-10-05 NOTE — Progress Notes (Signed)
53 y.o.  Single  Caucasian female   G0P0 here for annual exam.  Pt last seen with labial cyst which has since resolved.  Denies dyspareunia.  Pt with history of recurrent breast cysts and breast pain.  Pt is a Emergency planning/management officer, wears bullet proof vest regularly, now with a female vest, prior vest without breast pockets.  She brings in vest to show how it fits at todays exam  Last mammo 11/13 showed stability of cysts.  Pt denies heavy caffeine consumption.   She reports taking supplements for constipation-chronic.     Patient's last menstrual period was 08/20/2002.          Sexually active: yes  The current method of family planning is Hysterectomy.    Exercising: crossfit 4x/week Last mammogram: 04/28/12   Last pap smear: 12/30/09 History of abnormal pap: no Smoking: no Alcohol: yes 4 drinks/wk Last colonoscopy: none Last Bone Density:  none Last tetanus shot: 01/2008 Last cholesterol check: 2013- high BSE: no  Hgb:  13.9      Urine: Neg    Health Maintenance  Topic Date Due  . Tetanus/tdap  12/21/1980  . Colonoscopy  12/22/2011  . Pap Smear  12/30/2012  . Influenza Vaccine  02/19/2013  . Mammogram  04/28/2014    Family History  Problem Relation Age of Onset  . Diabetes Mother   . Hyperlipidemia Mother   . Hypertension Mother   . Ulcers Mother   . Fibromyalgia Mother   . Sudden death Maternal Grandfather   . Heart attack Neg Hx   . Cancer Maternal Grandmother   . Cancer Paternal Grandmother     breast/liver    Patient Active Problem List  Diagnosis  . HYPERLIPIDEMIA  . DEPRESSION  . GERD  . HIATAL HERNIA  . DYSPNEA ON EXERTION  . OBSTRUCTIVE SLEEP APNEA  . Intrinsic asthma, unspecified  . Right hand pain  . Cough    Past Medical History  Diagnosis Date  . GERD (gastroesophageal reflux disease)   . Genital warts 1994    Tx  x2  . Dysmenorrhea     2000  . Fibroid   . Anxiety     Past Surgical History  Procedure Laterality Date  . Reconstruction of  nose    . Shoulder surgery    . Abdominal hysterectomy  2004    ovaries retained. 2o fibroids  . Appendectomy  1980  . Breast surgery  1997    cyst left breast rem. benign    Allergies: Hydrocodone  Current Outpatient Prescriptions  Medication Sig Dispense Refill  . Multiple Vitamin (MULTIVITAMIN) tablet Take 1 tablet by mouth daily.        . Omega-3 Fatty Acids (FISH OIL) 1200 MG CAPS Take 1 capsule by mouth daily.        Marland Kitchen omeprazole-sodium bicarbonate (ZEGERID) 40-1100 MG per capsule Take 1 capsule by mouth daily before breakfast.         No current facility-administered medications for this visit.    ROS: Pertinent items are noted in HPI.  Social Hx:    Exam:    Ht 5' 4.5" (1.638 m)  Wt 234 lb (106.142 kg)  BMI 39.56 kg/m2  LMP 08/20/2002   Wt Readings from Last 3 Encounters:  10/05/12 234 lb (106.142 kg)  09/15/12 236 lb (107.049 kg)  09/09/11 231 lb 9.6 oz (105.053 kg)     Ht Readings from Last 3 Encounters:  10/05/12 5' 4.5" (1.638 m)  09/09/11 5\' 4"  (  1.626 m)  08/16/11 5\' 4"  (1.626 m)    General appearance: alert, cooperative and appears stated age Head: Normocephalic, without obvious abnormality, atraumatic Neck: no adenopathy, supple, symmetrical, trachea midline and thyroid not enlarged, symmetric, no tenderness/mass/nodules Lungs: clear to auscultation bilaterally Breasts: Inspection negative, No nipple retraction or dimpling, No nipple discharge or bleeding, No axillary or supraclavicular adenopathy, generalized density.  Breasts reexamined with bullet proof vest on- no excessive breast compression noted with vest in upper area where generalized tenderness appreciated , tenderness in left lateral breast 3 fingerbreadth from nipple at 3 o'clock, aprox 2cm mobile mass Heart: regular rate and rhythm Abdomen: soft, non-tender; bowel sounds normal; no masses,  no organomegaly Extremities: extremities normal, atraumatic, no cyanosis or edema Skin: Skin color,  texture, turgor normal. No rashes or lesions Lymph nodes: Cervical, supraclavicular, and axillary nodes normal. No abnormal inguinal nodes palpated Neurologic: Grossly normal   Pelvic: External genitalia:  no lesions              Urethra:  normal appearing urethra with no masses, tenderness or lesions              Bartholins and Skenes: normal                 Vagina: normal appearing vagina with normal color and discharge, no lesions              Cervix: absent              Pap taken: no        Bimanual Exam:  Uterus:  absent                                      Adnexa: no masses                                      Rectovaginal: Confirms, thin rectovaginal septum                                      Anus:  normal sphincter tone, no lesions  A: normal gyn exam Breast cysts constipation     P: mammogram-diagnostic with ultrasound for tenderness in lateral left breast questionable cyst Discussed better supportive bra for generalized breast tenderness, vitamin E BID Recommend increased fiber or routine of miralax to maintain colonic health, overdue for colonoscopy return annually or prn     An After Visit Summary was printed and given to the patient.

## 2012-10-05 NOTE — Patient Instructions (Addendum)

## 2012-10-05 NOTE — Telephone Encounter (Signed)
Pateint notified of need to call our office of appt. Date / time for [facility]. Marissa [name]

## 2012-10-10 ENCOUNTER — Telehealth: Payer: Self-pay | Admitting: *Deleted

## 2012-10-10 NOTE — Telephone Encounter (Signed)
Patient aware of [facility] @ 9;00am.

## 2012-10-18 ENCOUNTER — Ambulatory Visit
Admission: RE | Admit: 2012-10-18 | Discharge: 2012-10-18 | Disposition: A | Discharge status: Home / Self Care | Payer: 59 | Source: Ambulatory Visit | Attending: Gynecology | Admitting: Gynecology

## 2012-10-18 DIAGNOSIS — N63 Unspecified lump in unspecified breast: Secondary | ICD-10-CM

## 2013-01-25 DIAGNOSIS — J4520 Mild intermittent asthma, uncomplicated: Secondary | ICD-10-CM | POA: Insufficient documentation

## 2013-01-25 DIAGNOSIS — IMO0001 Reserved for inherently not codable concepts without codable children: Secondary | ICD-10-CM | POA: Insufficient documentation

## 2013-02-09 ENCOUNTER — Telehealth: Payer: Self-pay | Admitting: Cardiovascular Disease

## 2013-02-09 ENCOUNTER — Telehealth: Payer: Self-pay | Admitting: Obstetrics & Gynecology

## 2013-02-09 NOTE — Telephone Encounter (Signed)
New prob  Pt states she was a pt of Dr Elease Hashimoto and last saw him in 2007. She wants to know about a medication he had prescribed to her then.

## 2013-02-09 NOTE — Telephone Encounter (Signed)
Pt thinks [email or web address] put her on a cholesterol medication and she is not sure the name of it.

## 2013-02-09 NOTE — Telephone Encounter (Signed)
Spoke with pt about a medication she thought our office had started her on. Pt has history of high cholesterol and thought she had taken something for it years ago. No cholesterol med noted in Epic or old chart. Note in 2007 mentioned pt was being followed by a Dr. Melburn Popper for cholesterol. Pt will give his office a call and inquire. Pt appreciative.

## 2013-02-12 NOTE — Telephone Encounter (Signed)
PT wondered what cholesterol med she was on in 2007/ pt was only seen 1-2 times, told her to call her pharmacy to see if they have record told her to call back and we could request her record for then. She agreed to plan.

## 2013-02-12 NOTE — Telephone Encounter (Signed)
F/u   Pt calling about previous message. please call pt

## 2013-03-23 DIAGNOSIS — K635 Polyp of colon: Secondary | ICD-10-CM | POA: Insufficient documentation

## 2013-06-11 ENCOUNTER — Other Ambulatory Visit: Payer: Self-pay

## 2013-06-11 ENCOUNTER — Other Ambulatory Visit: Payer: Self-pay | Admitting: Bariatrics

## 2013-06-11 DIAGNOSIS — Z1231 Encounter for screening mammogram for malignant neoplasm of breast: Secondary | ICD-10-CM

## 2013-07-10 ENCOUNTER — Ambulatory Visit
Admission: RE | Admit: 2013-07-10 | Discharge: 2013-07-10 | Disposition: A | Discharge status: Home / Self Care | Payer: 59 | Source: Ambulatory Visit

## 2013-07-10 DIAGNOSIS — Z1231 Encounter for screening mammogram for malignant neoplasm of breast: Secondary | ICD-10-CM

## 2013-07-11 ENCOUNTER — Encounter (HOSPITAL_BASED_OUTPATIENT_CLINIC_OR_DEPARTMENT_OTHER): Payer: Self-pay | Admitting: *Deleted

## 2013-07-11 NOTE — Progress Notes (Signed)
No labs needed Has sleep apnea-cannot use cpap-makes her sick-going to see new pulm md
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
## 2013-07-16 ENCOUNTER — Ambulatory Visit (HOSPITAL_BASED_OUTPATIENT_CLINIC_OR_DEPARTMENT_OTHER): Payer: Worker's Compensation | Admitting: Anesthesiology

## 2013-07-16 ENCOUNTER — Encounter (HOSPITAL_BASED_OUTPATIENT_CLINIC_OR_DEPARTMENT_OTHER)
Admission: RE | Disposition: A | Discharge status: Home / Self Care | Payer: Self-pay | Source: Ambulatory Visit | Attending: Orthopedic Surgery

## 2013-07-16 ENCOUNTER — Encounter (HOSPITAL_BASED_OUTPATIENT_CLINIC_OR_DEPARTMENT_OTHER): Payer: Worker's Compensation | Admitting: Anesthesiology

## 2013-07-16 ENCOUNTER — Encounter (HOSPITAL_BASED_OUTPATIENT_CLINIC_OR_DEPARTMENT_OTHER): Payer: Self-pay | Admitting: *Deleted

## 2013-07-16 ENCOUNTER — Ambulatory Visit (HOSPITAL_BASED_OUTPATIENT_CLINIC_OR_DEPARTMENT_OTHER)
Admission: RE | Admit: 2013-07-16 | Discharge: 2013-07-16 | Disposition: A | Discharge status: Home / Self Care | Payer: Worker's Compensation | Source: Ambulatory Visit | Attending: Orthopedic Surgery | Admitting: Orthopedic Surgery

## 2013-07-16 DIAGNOSIS — M25569 Pain in unspecified knee: Secondary | ICD-10-CM | POA: Diagnosis present

## 2013-07-16 DIAGNOSIS — M2340 Loose body in knee, unspecified knee: Secondary | ICD-10-CM | POA: Diagnosis not present

## 2013-07-16 DIAGNOSIS — S83289A Other tear of lateral meniscus, current injury, unspecified knee, initial encounter: Secondary | ICD-10-CM

## 2013-07-16 DIAGNOSIS — M6750 Plica syndrome, unspecified knee: Secondary | ICD-10-CM | POA: Diagnosis not present

## 2013-07-16 DIAGNOSIS — K219 Gastro-esophageal reflux disease without esophagitis: Secondary | ICD-10-CM | POA: Insufficient documentation

## 2013-07-16 DIAGNOSIS — G4730 Sleep apnea, unspecified: Secondary | ICD-10-CM | POA: Insufficient documentation

## 2013-07-16 DIAGNOSIS — F411 Generalized anxiety disorder: Secondary | ICD-10-CM | POA: Insufficient documentation

## 2013-07-16 DIAGNOSIS — Z885 Allergy status to narcotic agent status: Secondary | ICD-10-CM | POA: Diagnosis not present

## 2013-07-16 DIAGNOSIS — M2240 Chondromalacia patellae, unspecified knee: Secondary | ICD-10-CM | POA: Insufficient documentation

## 2013-07-16 DIAGNOSIS — M23359 Other meniscus derangements, posterior horn of lateral meniscus, unspecified knee: Secondary | ICD-10-CM | POA: Diagnosis not present

## 2013-07-16 HISTORY — DX: Sleep apnea, unspecified: G47.30

## 2013-07-16 HISTORY — DX: Unspecified asthma, uncomplicated: J45.909

## 2013-07-16 HISTORY — PX: KNEE ARTHROSCOPY: SHX127

## 2013-07-16 LAB — POCT HEMOGLOBIN-HEMACUE: Hemoglobin: 11.3 g/dL — ABNORMAL LOW (ref 12.0–15.0)

## 2013-07-16 SURGERY — ARTHROSCOPY, KNEE
Anesthesia: General | Site: Knee | Laterality: Right

## 2013-07-16 MED ORDER — ONDANSETRON HCL 4 MG PO TABS: 4.0000 mg | ORAL_TABLET | Freq: Four times a day (QID) | ORAL | Status: DC | PRN

## 2013-07-16 MED ORDER — HYDROMORPHONE HCL PF 1 MG/ML IJ SOLN
0.2500 mg | INTRAMUSCULAR | Status: DC | PRN
  Administered 2013-07-16 (×2): 0.5 mg via INTRAVENOUS

## 2013-07-16 MED ORDER — LIDOCAINE HCL (CARDIAC) 20 MG/ML IV SOLN
INTRAVENOUS | Status: DC | PRN
  Administered 2013-07-16: 60 mg via INTRAVENOUS

## 2013-07-16 MED ORDER — MIDAZOLAM HCL 2 MG/2ML IJ SOLN
INTRAMUSCULAR | Status: AC
  Filled 2013-07-16: qty 2

## 2013-07-16 MED ORDER — OXYCODONE HCL 5 MG/5ML PO SOLN: 5.0000 mg | Freq: Once | ORAL | Status: AC | PRN

## 2013-07-16 MED ORDER — DEXAMETHASONE SODIUM PHOSPHATE 4 MG/ML IJ SOLN
INTRAMUSCULAR | Status: DC | PRN
  Administered 2013-07-16: 10 mg via INTRAVENOUS

## 2013-07-16 MED ORDER — METOCLOPRAMIDE HCL 5 MG PO TABS: 5.0000 mg | ORAL_TABLET | Freq: Three times a day (TID) | ORAL | Status: DC | PRN

## 2013-07-16 MED ORDER — METOCLOPRAMIDE HCL 5 MG/ML IJ SOLN: 5.0000 mg | Freq: Three times a day (TID) | INTRAMUSCULAR | Status: DC | PRN

## 2013-07-16 MED ORDER — SODIUM CHLORIDE 0.9 % IR SOLN
Status: DC | PRN
  Administered 2013-07-16: 6000 mL

## 2013-07-16 MED ORDER — FENTANYL CITRATE 0.05 MG/ML IJ SOLN
INTRAMUSCULAR | Status: AC
  Filled 2013-07-16: qty 4

## 2013-07-16 MED ORDER — TRAMADOL HCL 50 MG PO TABS: 50.0000 mg | ORAL_TABLET | Freq: Four times a day (QID) | ORAL | Status: DC | PRN

## 2013-07-16 MED ORDER — ONDANSETRON HCL 4 MG/2ML IJ SOLN: 4.0000 mg | Freq: Four times a day (QID) | INTRAMUSCULAR | Status: DC | PRN

## 2013-07-16 MED ORDER — LACTATED RINGERS IV SOLN
INTRAVENOUS | Status: DC
  Administered 2013-07-16 (×2): via INTRAVENOUS

## 2013-07-16 MED ORDER — PROPOFOL 10 MG/ML IV BOLUS
INTRAVENOUS | Status: DC | PRN
  Administered 2013-07-16: 200 mg via INTRAVENOUS

## 2013-07-16 MED ORDER — MIDAZOLAM HCL 5 MG/5ML IJ SOLN
INTRAMUSCULAR | Status: DC | PRN
  Administered 2013-07-16: 2 mg via INTRAVENOUS

## 2013-07-16 MED ORDER — BUPIVACAINE HCL (PF) 0.5 % IJ SOLN
INTRAMUSCULAR | Status: AC
  Filled 2013-07-16: qty 30

## 2013-07-16 MED ORDER — OXYCODONE HCL 5 MG PO TABS
5.0000 mg | ORAL_TABLET | Freq: Once | ORAL | Status: AC | PRN
  Administered 2013-07-16: 5 mg via ORAL
  Filled 2013-07-16: qty 1

## 2013-07-16 MED ORDER — LIDOCAINE HCL (PF) 1 % IJ SOLN
INTRAMUSCULAR | Status: AC
  Filled 2013-07-16: qty 30

## 2013-07-16 MED ORDER — HYDROMORPHONE HCL PF 1 MG/ML IJ SOLN
INTRAMUSCULAR | Status: AC
  Filled 2013-07-16: qty 1

## 2013-07-16 MED ORDER — FENTANYL CITRATE 0.05 MG/ML IJ SOLN
INTRAMUSCULAR | Status: DC | PRN
  Administered 2013-07-16: 50 ug via INTRAVENOUS
  Administered 2013-07-16 (×2): 25 ug via INTRAVENOUS
  Administered 2013-07-16 (×2): 50 ug via INTRAVENOUS

## 2013-07-16 MED ORDER — BUPIVACAINE-EPINEPHRINE 0.5% -1:200000 IJ SOLN
INTRAMUSCULAR | Status: DC | PRN
  Administered 2013-07-16: 20 mL

## 2013-07-16 MED ORDER — MIDAZOLAM HCL 2 MG/2ML IJ SOLN: 1.0000 mg | INTRAMUSCULAR | Status: DC | PRN

## 2013-07-16 MED ORDER — FENTANYL CITRATE 0.05 MG/ML IJ SOLN: 50.0000 ug | INTRAMUSCULAR | Status: DC | PRN

## 2013-07-16 MED ORDER — BUPIVACAINE-EPINEPHRINE PF 0.5-1:200000 % IJ SOLN
INTRAMUSCULAR | Status: AC
  Filled 2013-07-16: qty 30

## 2013-07-16 MED ORDER — ONDANSETRON HCL 4 MG/2ML IJ SOLN
INTRAMUSCULAR | Status: DC | PRN
  Administered 2013-07-16: 4 mg via INTRAVENOUS

## 2013-07-16 SURGICAL SUPPLY — 44 items
BANDAGE ELASTIC 6 VELCRO ST LF (GAUZE/BANDAGES/DRESSINGS) ×3 IMPLANT
BLADE 4.2CUDA (BLADE) IMPLANT
BLADE CUTTER GATOR 3.5 (BLADE) ×3 IMPLANT
BLADE GREAT WHITE 4.2 (BLADE) IMPLANT
BLADE GREAT WHITE 4.2MM (BLADE)
CANISTER SUCT 3000ML (MISCELLANEOUS) IMPLANT
CANISTER SUCT LVC 12 LTR MEDI- (MISCELLANEOUS) IMPLANT
DRAPE ARTHROSCOPY W/POUCH 114 (DRAPES) ×3 IMPLANT
DURAPREP 26ML APPLICATOR (WOUND CARE) ×3 IMPLANT
ELECT MENISCUS 165MM 90D (ELECTRODE) IMPLANT
ELECT REM PT RETURN 9FT ADLT (ELECTROSURGICAL)
ELECTRODE REM PT RTRN 9FT ADLT (ELECTROSURGICAL) IMPLANT
GAUZE XEROFORM 1X8 LF (GAUZE/BANDAGES/DRESSINGS) ×3 IMPLANT
GLOVE BIO SURGEON STRL SZ7.5 (GLOVE) ×3 IMPLANT
GLOVE BIO SURGEON STRL SZ8.5 (GLOVE) ×3 IMPLANT
GLOVE BIOGEL PI IND STRL 7.0 (GLOVE) ×1 IMPLANT
GLOVE BIOGEL PI IND STRL 8 (GLOVE) ×1 IMPLANT
GLOVE BIOGEL PI IND STRL 9 (GLOVE) ×1 IMPLANT
GLOVE BIOGEL PI INDICATOR 7.0 (GLOVE) ×2
GLOVE BIOGEL PI INDICATOR 8 (GLOVE) ×2
GLOVE BIOGEL PI INDICATOR 9 (GLOVE) ×2
GLOVE ECLIPSE 6.5 STRL STRAW (GLOVE) ×3 IMPLANT
GOWN STRL REUS W/ TWL LRG LVL3 (GOWN DISPOSABLE) ×1 IMPLANT
GOWN STRL REUS W/ TWL LRG LVL4 (GOWN DISPOSABLE) ×1 IMPLANT
GOWN STRL REUS W/TWL LRG LVL3 (GOWN DISPOSABLE) ×2
GOWN STRL REUS W/TWL LRG LVL4 (GOWN DISPOSABLE) ×2
GOWN STRL REUS W/TWL XL LVL4 (GOWN DISPOSABLE) ×3 IMPLANT
IV NS IRRIG 3000ML ARTHROMATIC (IV SOLUTION) ×6 IMPLANT
KNEE WRAP E Z 3 GEL PACK (MISCELLANEOUS) ×3 IMPLANT
NDL SAFETY ECLIPSE 18X1.5 (NEEDLE) ×1 IMPLANT
NEEDLE HYPO 18GX1.5 SHARP (NEEDLE) ×2
PACK ARTHROSCOPY DSU (CUSTOM PROCEDURE TRAY) ×3 IMPLANT
PACK BASIN DAY SURGERY FS (CUSTOM PROCEDURE TRAY) ×3 IMPLANT
PAD ALCOHOL SWAB (MISCELLANEOUS) ×3 IMPLANT
PENCIL BUTTON HOLSTER BLD 10FT (ELECTRODE) IMPLANT
SET ARTHROSCOPY TUBING (MISCELLANEOUS) ×2
SET ARTHROSCOPY TUBING LN (MISCELLANEOUS) ×1 IMPLANT
SLEEVE SCD COMPRESS KNEE MED (MISCELLANEOUS) IMPLANT
SPONGE GAUZE 4X4 12PLY (GAUZE/BANDAGES/DRESSINGS) ×3 IMPLANT
SYR 3ML 18GX1 1/2 (SYRINGE) IMPLANT
SYR 5ML LL (SYRINGE) IMPLANT
TOWEL OR 17X24 6PK STRL BLUE (TOWEL DISPOSABLE) ×3 IMPLANT
WAND STAR VAC 90 (SURGICAL WAND) IMPLANT
WATER STERILE IRR 1000ML POUR (IV SOLUTION) ×3 IMPLANT

## 2013-07-16 NOTE — Anesthesia Procedure Notes (Signed)
Procedure Name: LMA Insertion Performed by: Shermeka Rutt Pre-anesthesia Checklist: Patient identified, Emergency Drugs available, Suction available and Patient being monitored Patient Re-evaluated:Patient Re-evaluated prior to inductionOxygen Delivery Method: Circle System Utilized Preoxygenation: Pre-oxygenation with 100% oxygen Intubation Type: IV induction Ventilation: Mask ventilation without difficulty LMA: LMA inserted LMA Size: 4.0 Number of attempts: 1 Airway Equipment and Method: bite block Placement Confirmation: positive ETCO2 Tube secured with: Tape Dental Injury: Teeth and Oropharynx as per pre-operative assessment      

## 2013-07-16 NOTE — Discharge Instructions (Signed)
Arthroscopic Procedure, Knee An arthroscopic procedure can find what is wrong with your knee. PROCEDURE Arthroscopy is a surgical technique that allows your orthopedic surgeon to diagnose and treat your knee injury with accuracy. They will look into your knee through a small instrument. This is almost like a small (pencil sized) telescope. Because arthroscopy affects your knee less than open knee surgery, you can anticipate a more rapid recovery. Taking an active role by following your caregiver's instructions will help with rapid and complete recovery. Use crutches, rest, elevation, ice, and knee exercises as instructed. The length of recovery depends on various factors including type of injury, age, physical condition, medical conditions, and your rehabilitation. Your knee is the joint between the large bones (femur and tibia) in your leg. Cartilage covers these bone ends which are smooth and slippery and allow your knee to bend and move smoothly. Two menisci, thick, semi-lunar shaped pads of cartilage which form a rim inside the joint, help absorb shock and stabilize your knee. Ligaments bind the bones together and support your knee joint. Muscles move the joint, help support your knee, and take stress off the joint itself. Because of this all programs and physical therapy to rehabilitate an injured or repaired knee require rebuilding and strengthening your muscles. AFTER THE PROCEDURE  After the procedure, you will be moved to a recovery area until most of the effects of the medication have worn off. Your caregiver will discuss the test results with you.  Only take over-the-counter or prescription medicines for pain, discomfort, or fever as directed by your caregiver. SEEK MEDICAL CARE IF:   You have increased bleeding from your wounds.  You see redness, swelling, or have increasing pain in your wounds.  You have pus coming from your wound.  You have an oral temperature above 102 F (38.9  C).  You notice a bad smell coming from the wound or dressing.  You have severe pain with any motion of your knee. SEEK IMMEDIATE MEDICAL CARE IF:   You develop a rash.  You have difficulty breathing.  You have any allergic problems. Document Released: 06/04/2000 Document Revised: 08/30/2011 Document Reviewed: 12/27/2007 [facility] Patient Information 2014 [place].  Discharge Instructions After Orthopedic Procedures:  *You may feel tired and weak following your procedure. It is recommended that you limit physical activity for the next 24 hours and rest at home for the remainder of today and tomorrow. *No strenuous activity should be started without your doctor's permission.  Elevate the extremity that you had surgery on to a level above your heart. This should continue for 48 hours or as instructed by your doctor.  If you had hand, arm or shoulder surgery you should move your fingers frequently unless otherwise instructed by your doctor.  If you had foot, knee or leg surgery you should wiggle your toes frequently unless otherwise instructed by your doctor.  Follow your doctor's exact instructions for activity at home. Use your home equipment as instructed. (Crutches, hard shoes, slings etc.)  Limit your activity as instructed by your doctor.  Report to your doctor should any of the following occur: 1. Extreme swelling of your fingers or toes. 2. Inability to wiggle your fingers or toes. 3. Coldness, pale or bluish color in your fingers or toes. 4. Loss of sensation, numbness or tingling of your fingers or toes. 5. Unusual smell or odor from under your dressing or cast. 6. Excessive bleeding or drainage from the surgical site. 7. Pain not relieved by medication  your doctor has prescribed for you. 8. Cast or dressing too tight (do not get your dressing or cast wet or put anything under          your dressing or cast.)  *Do not change your dressing unless instructed by your  doctor or discharge nurse. Then follow exact instructions.  *Follow labeled instructions for any medications that your doctor may have prescribed for you. *Should any questions or complications develop following your procedure, PLEASE CONTACT YOUR DOCTOR.  Post Anesthesia Home Care Instructions  Activity: Get plenty of rest for the remainder of the day. A responsible adult should stay with you for 24 hours following the procedure.  For the next 24 hours, DO NOT: -Drive a car -Paediatric nurse -Drink alcoholic beverages -Take any medication unless instructed by your physician -Make any legal decisions or sign important papers.  Meals: Start with liquid foods such as gelatin or soup. Progress to regular foods as tolerated. Avoid greasy, spicy, heavy foods. If nausea and/or vomiting occur, drink only clear liquids until the nausea and/or vomiting subsides. Call your physician if vomiting continues.  Special Instructions/Symptoms: Your throat may feel dry or sore from the anesthesia or the breathing tube placed in your throat during surgery. If this causes discomfort, gargle with warm salt water. The discomfort should disappear within 24 hours.

## 2013-07-16 NOTE — Transfer of Care (Signed)
Immediate Anesthesia Transfer of Care Note  Patient: Marissa [name]  Procedure(s) Performed: Procedure(s): RIGHT ARTHROSCOPY KNEE, partial lateral menisectomy and chrondromalasia, with microfracture of lateral femeral chondyl, and excision of plica  (Right)  Patient Location: PACU  Anesthesia Type:General  Level of Consciousness: awake, alert , oriented and patient cooperative  Airway & Oxygen Therapy: Patient Spontanous Breathing and Patient connected to face mask oxygen  Post-op Assessment: Report given to PACU RN and Post -op Vital signs reviewed and stable  Post vital signs: Reviewed and stable  Complications: No apparent anesthesia complications
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
## 2013-07-16 NOTE — Interval H&P Note (Signed)
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 After consideration of risks, benefits and other options for treatment, the patient has consented to  Procedure(s): RIGHT ARTHROSCOPY KNEE (Right) as a surgical intervention .  The patient's history has been reviewed, patient examined, no change in status, stable for surgery.  I have reviewed the patient's chart and labs.  Questions were answered to the patient's satisfaction.     Kerin Salen

## 2013-07-16 NOTE — Anesthesia Postprocedure Evaluation (Signed)
Anesthesia Post Note  Patient: Marissa [name]  Procedure(s) Performed: Procedure(s) (LRB): RIGHT ARTHROSCOPY KNEE, partial lateral menisectomy and chrondromalasia, with microfracture of lateral femeral chondyl, and excision of plica  (Right)  Anesthesia type: General  Patient location: PACU  Post pain: Pain level controlled and Adequate analgesia  Post assessment: Post-op Vital signs reviewed, Patient's Cardiovascular Status Stable, Respiratory Function Stable, Patent Airway and Pain level controlled  Last Vitals:  Filed Vitals:   07/16/13 1545  BP: 130/63  Pulse: 64  Temp:   Resp: 12    Post vital signs: Reviewed and stable  Level of consciousness: awake, alert  and oriented  Complications: No apparent anesthesia complications

## 2013-07-16 NOTE — Anesthesia Preprocedure Evaluation (Signed)
Anesthesia Evaluation  Patient identified by MRN, date of birth, ID band Patient awake    Reviewed: Allergy & Precautions, H&P , NPO status , Patient's Chart, lab work & pertinent test results  Airway Mallampati: II  Neck ROM: full    Dental   Pulmonary shortness of breath, asthma , sleep apnea ,          Cardiovascular negative cardio ROS      Neuro/Psych Anxiety Depression  Neuromuscular disease    GI/Hepatic GERD-  ,  Endo/Other  obese  Renal/GU      Musculoskeletal   Abdominal   Peds  Hematology   Anesthesia Other Findings   Reproductive/Obstetrics                           Anesthesia Physical Anesthesia Plan  ASA: II  Anesthesia Plan: General   Post-op Pain Management:    Induction: Intravenous  Airway Management Planned: LMA  Additional Equipment:   Intra-op Plan:   Post-operative Plan:   Informed Consent: I have reviewed the patients History and Physical, chart, labs and discussed the procedure including the risks, benefits and alternatives for the proposed anesthesia with the patient or authorized representative who has indicated his/her understanding and acceptance.     Plan Discussed with: CRNA, Anesthesiologist and Surgeon  Anesthesia Plan Comments:         Anesthesia Quick Evaluation

## 2013-07-18 ENCOUNTER — Encounter (HOSPITAL_BASED_OUTPATIENT_CLINIC_OR_DEPARTMENT_OTHER): Payer: Self-pay | Admitting: Orthopedic Surgery

## 2013-11-16 ENCOUNTER — Encounter: Payer: Self-pay | Admitting: Obstetrics & Gynecology

## 2013-12-04 IMAGING — US US BREAST*L*
1 series · 5 of 5 positions shown · non-contrast
Comparison: 04/18/2012 and earlier

CLINICAL DATA: Focal tenderness in the upper-outer quadrant of the
left breast, which she has noticed for years.  The patient wears a
bullet proof breast at work, possibly exacerbating symptoms.

DIGITAL DIAGNOSTIC LEFT MAMMOGRAM WITH CAD AND LEFT BREAST
ULTRASOUND:

[Series 1: us breast*left* · 5 of 5 slices shown]
[im 1/5]
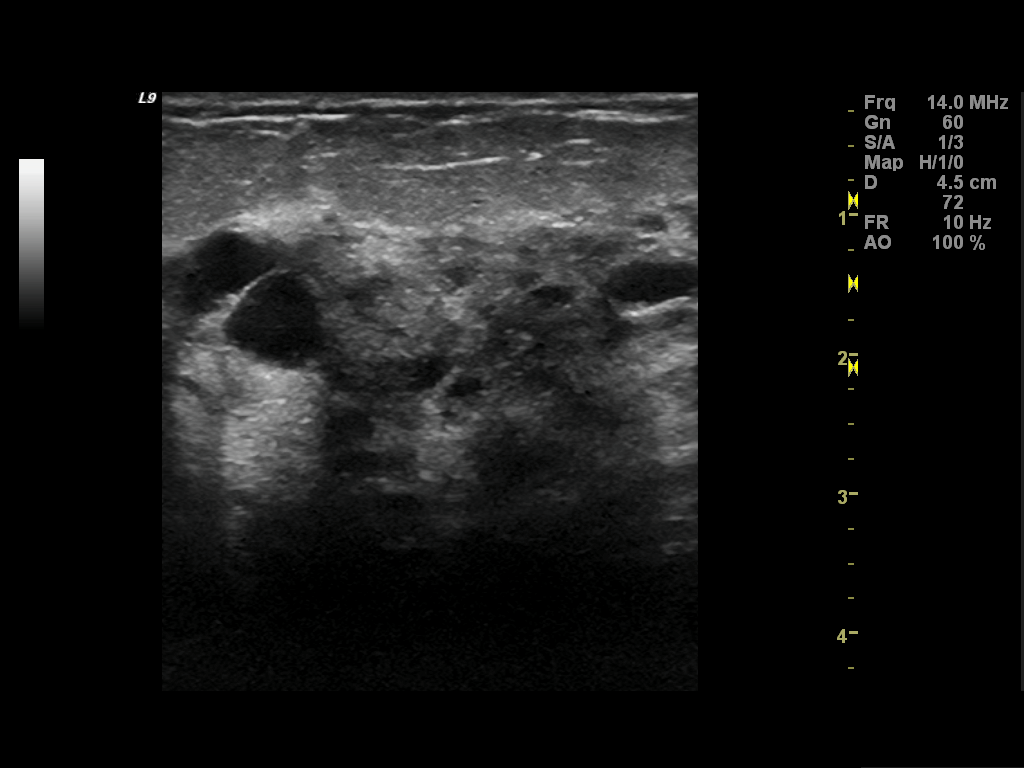
[im 2/5]
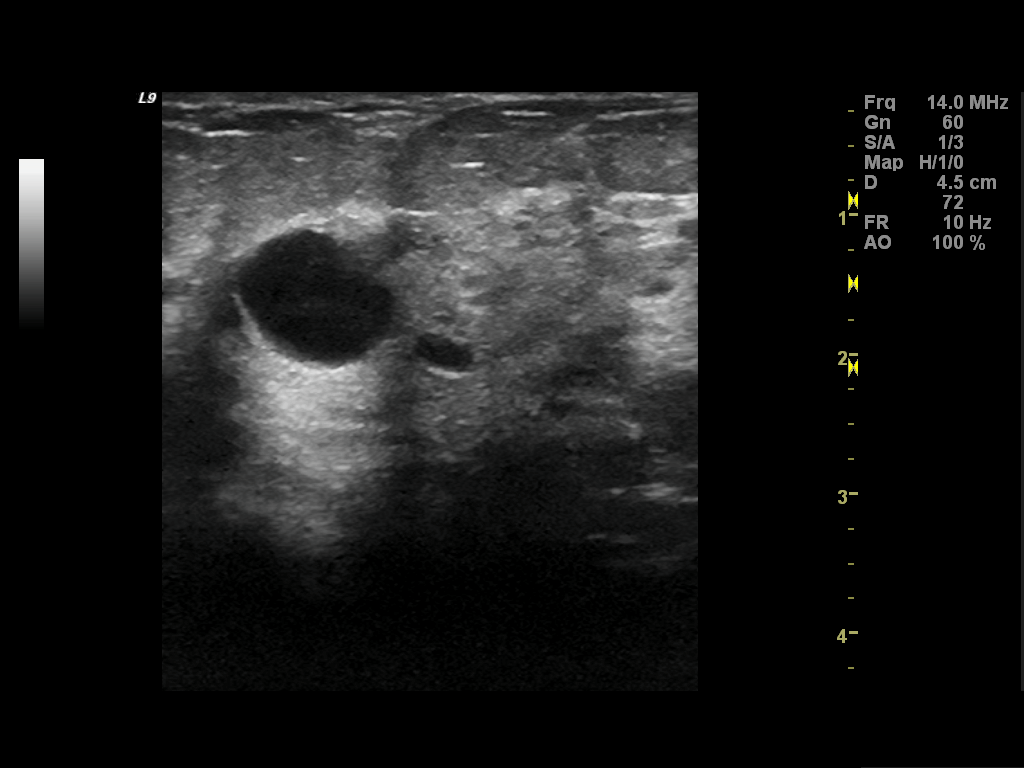
[im 3/5]
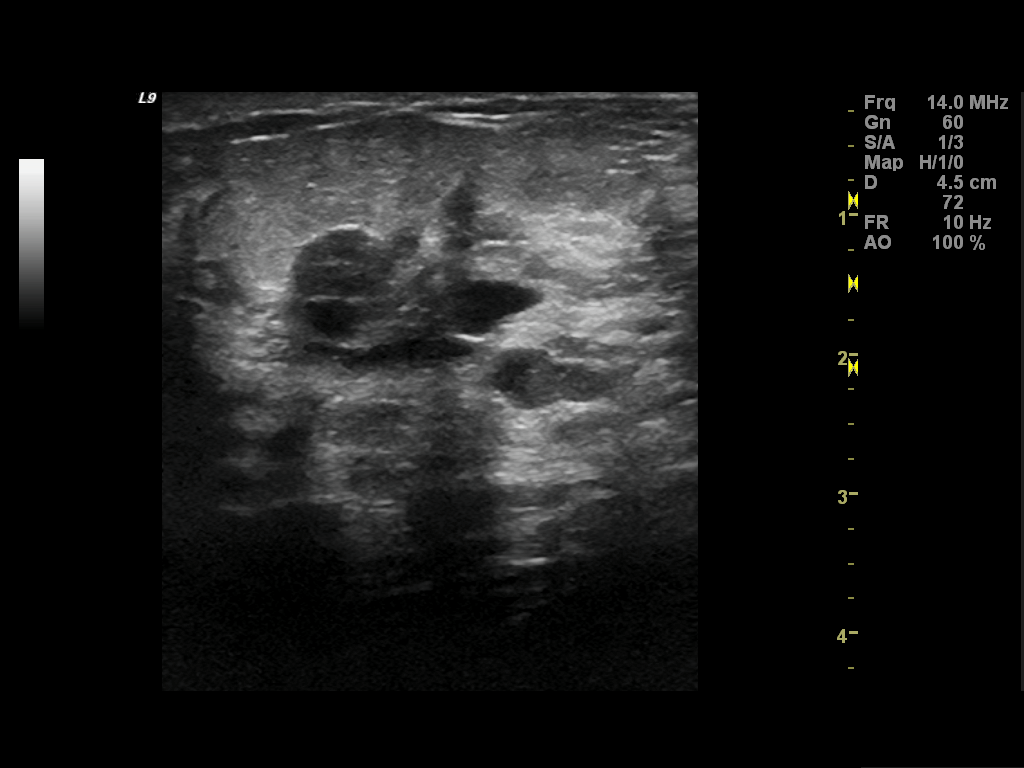
[im 4/5]
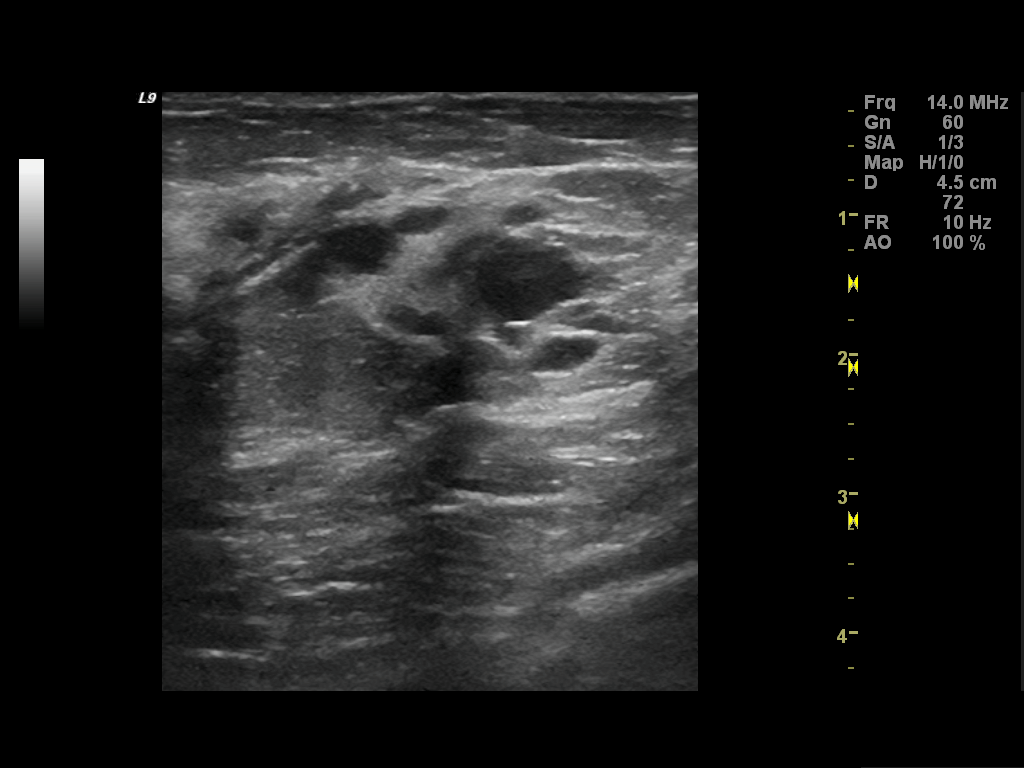
[im 5/5]
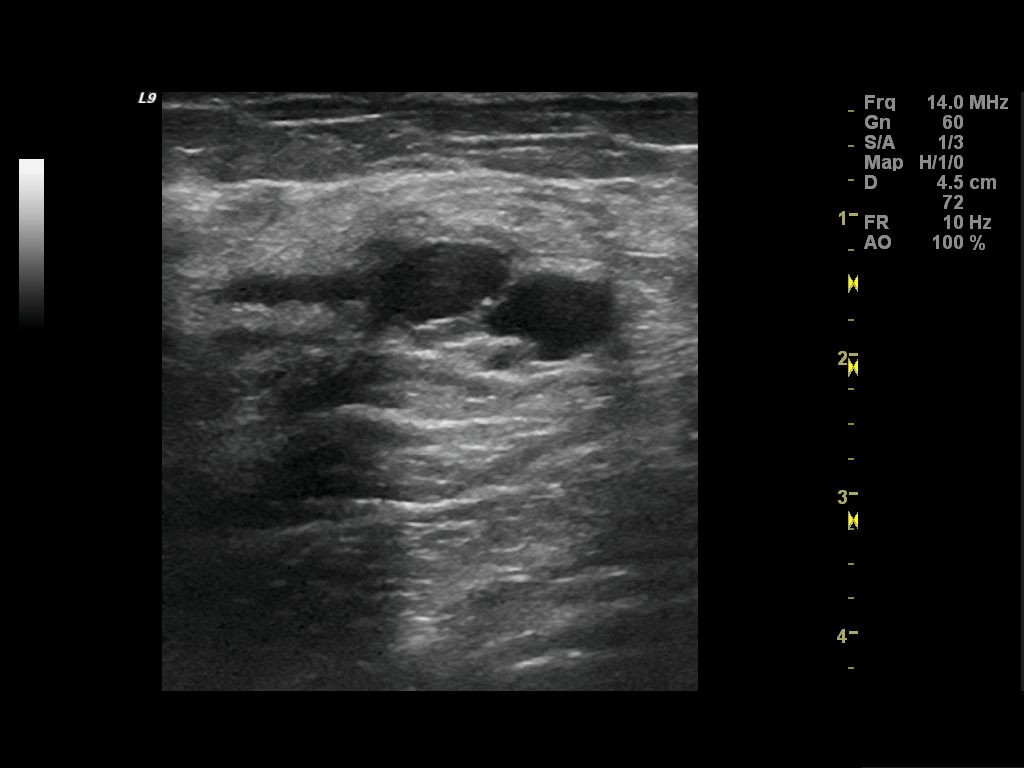

[5 of 5 positions shown; findings below may reference images not displayed]

FINDINGS: ACR Breast Density Category 4: The breast tissue is extremely
dense.

No suspicious mass, distortion, or microcalcifications are
identified to suggest presence of malignancy.  There are scattered,
benign-appearing calcifications in the lateral portion of the left
breast.

Mammographic images were processed with CAD.

On physical exam, I palpate soft thickening in the 1 o'clock and 3
o'clock locations of the left breast.  The patient is focally
tender in the 2 o'clock location.  In this region I palpate no
mass.

Ultrasound is performed, showing numerous simple and complicated
cysts within the upper-outer quadrant of the left breast.  No solid
mass or area of acoustic shadowing identified.  Within the 2
o'clock location, in the area of patient's focal tenderness, no
mass is identified.
IMPRESSION: No mammographic or ultrasound evidence for malignancy.
Numerous cysts within the upper-outer quadrant, none of which
correlate with the region of the patient's focal tenderness.

RECOMMENDATION:
Screening mammogram is recommended in March 2013.

I have discussed the findings and recommendations with the patient.
Results were also provided in writing at the conclusion of the
visit.  If applicable, a reminder letter will be sent to the
patient regarding the next appointment.

BI-RADS CATEGORY 2:  Benign finding(s).

## 2014-07-05 HISTORY — PX: KNEE ARTHROSCOPY: SHX127

## 2014-07-25 ENCOUNTER — Other Ambulatory Visit: Payer: Self-pay | Admitting: Orthopaedic Surgery

## 2014-07-29 NOTE — H&P (Signed)
Marissa [name] is an 54 y.o. female.   Chief Complaint: Left Shoulder pain DDU:KGURKY is here again about her left shoulder.  We gave her an injection about a month ago which helped for just a few hours.  She has pain reaching up and out and cannot rest on this side. She also has the thumb pain.  She is wearing a brace for that problem.   X-ray: 3 views left shoulder 2A acromionand multiple views including CMC joint fusion of left thumb shows significant DJD and CMC joint left thumb area  Past Medical History  Diagnosis Date  . GERD (gastroesophageal reflux disease)   . Genital warts 1994    Tx  x2  . Dysmenorrhea     2000  . Fibroid   . Anxiety   . Asthma     exercise induced-no inhalers  . Sleep apnea     has a cpap-cannot use-makes her sick    Past Surgical History  Procedure Laterality Date  . Reconstruction of nose  1986  . Shoulder surgery  1994    rt  . Abdominal hysterectomy  2004    ovaries retained. 2o fibroids  . Appendectomy  1980  . Breast surgery  1997    cyst left breast rem. benign  . Colonoscopy    . Knee arthroscopy Right 07/16/2013    Procedure: RIGHT ARTHROSCOPY KNEE, partial lateral menisectomy and chrondromalasia, with microfracture of lateral femeral chondyl, and excision of plica ;  Surgeon: Kerin Salen, MD;  Location: [facility];  Service: Orthopedics;  Laterality: Right;    Family History  Problem Relation Age of Onset  . Diabetes Mother   . Hyperlipidemia Mother   . Hypertension Mother   . Ulcers Mother   . Fibromyalgia Mother   . Sudden death Maternal Grandfather   . Heart attack Neg Hx   . Cancer Maternal Grandmother   . Cancer Paternal Grandmother     breast/liver   Social History:  reports that she has never smoked. She has never used smokeless tobacco. She reports that she drinks about 3.6 oz of alcohol per week. She reports that she does not use illicit drugs.  Allergies:  Allergies  Allergen Reactions  .  Hydrocodone Nausea Only    No prescriptions prior to admission    No results found for this or any previous visit (from the past 48 hour(s)). No results found.  Review of Systems  Musculoskeletal: Positive for joint pain.       Left shoulder.  All other systems reviewed and are negative.   Last menstrual period 08/20/2002. Physical Exam  Constitutional: She is oriented to person, place, and time. She appears well-developed and well-nourished.  HENT:  Head: Normocephalic and atraumatic.  Eyes: Conjunctivae are normal. Pupils are equal, round, and reactive to light.  Neck: Normal range of motion.  Cardiovascular: Normal rate and regular rhythm.   Respiratory: Effort normal.  GI: Soft.  Musculoskeletal:  Left shoulder motion remains fairly good.  She has impingement in both positions and no pain at the [facility] joint to speak of.  She does have pain to resisted external rotation but her strength is good.  Cervical motion is full and there is no palpable lymphadenopathy.  Sensation and motor function are intact in her hands with palpable pulses on both sides. She has pain to subluxation of her basal joint at the left wrist.    Neurological: She is alert and oriented to person, place, and  time.  Skin: Skin is warm.  Psychiatric: She has a normal mood and affect. Her behavior is normal. Judgment and thought content normal.     Assessment/Plan Assessment: Left shoulder impingement  Plan: Pattie would really like her left shoulder fixed. It would be nice to get an MRI scan to look into the nature of her problem but I feel fairly confident that she does not have a full-thickness cuff tear and just has some impingement.  I reviewed risk of anesthesia and infection related to an acromioplasty procedure through the scope.  We'll take advantage the anesthetic and inject her basal joint at the same time.   Salvador Bigbee, Larwance Sachs 07/29/2014, 10:01 AM

## 2014-07-30 ENCOUNTER — Encounter (HOSPITAL_BASED_OUTPATIENT_CLINIC_OR_DEPARTMENT_OTHER): Payer: Self-pay | Admitting: *Deleted

## 2014-07-30 NOTE — Progress Notes (Signed)
Pt here last yr for knee-did well-has sleep apnea-cannot use a cpap-told to pack overnight bag and bring meds just in case she has to stay

## 2014-08-02 ENCOUNTER — Encounter (HOSPITAL_BASED_OUTPATIENT_CLINIC_OR_DEPARTMENT_OTHER): Payer: Self-pay | Admitting: Certified Registered"

## 2014-08-02 ENCOUNTER — Ambulatory Visit (HOSPITAL_BASED_OUTPATIENT_CLINIC_OR_DEPARTMENT_OTHER)
Admission: RE | Admit: 2014-08-02 | Discharge: 2014-08-02 | Disposition: A | Discharge status: Home / Self Care | Payer: 59 | Source: Ambulatory Visit | Attending: Orthopaedic Surgery | Admitting: Orthopaedic Surgery

## 2014-08-02 ENCOUNTER — Encounter (HOSPITAL_BASED_OUTPATIENT_CLINIC_OR_DEPARTMENT_OTHER)
Admission: RE | Disposition: A | Discharge status: Home / Self Care | Payer: Self-pay | Source: Ambulatory Visit | Attending: Orthopaedic Surgery

## 2014-08-02 ENCOUNTER — Ambulatory Visit (HOSPITAL_BASED_OUTPATIENT_CLINIC_OR_DEPARTMENT_OTHER): Payer: 59 | Admitting: Certified Registered"

## 2014-08-02 DIAGNOSIS — G4730 Sleep apnea, unspecified: Secondary | ICD-10-CM | POA: Diagnosis not present

## 2014-08-02 DIAGNOSIS — Z90710 Acquired absence of both cervix and uterus: Secondary | ICD-10-CM | POA: Diagnosis not present

## 2014-08-02 DIAGNOSIS — K219 Gastro-esophageal reflux disease without esophagitis: Secondary | ICD-10-CM | POA: Insufficient documentation

## 2014-08-02 DIAGNOSIS — J45909 Unspecified asthma, uncomplicated: Secondary | ICD-10-CM | POA: Diagnosis not present

## 2014-08-02 DIAGNOSIS — M19042 Primary osteoarthritis, left hand: Secondary | ICD-10-CM | POA: Diagnosis not present

## 2014-08-02 DIAGNOSIS — M7552 Bursitis of left shoulder: Secondary | ICD-10-CM | POA: Diagnosis not present

## 2014-08-02 DIAGNOSIS — M75102 Unspecified rotator cuff tear or rupture of left shoulder, not specified as traumatic: Secondary | ICD-10-CM | POA: Insufficient documentation

## 2014-08-02 DIAGNOSIS — F419 Anxiety disorder, unspecified: Secondary | ICD-10-CM | POA: Insufficient documentation

## 2014-08-02 DIAGNOSIS — M7542 Impingement syndrome of left shoulder: Secondary | ICD-10-CM | POA: Insufficient documentation

## 2014-08-02 DIAGNOSIS — M25812 Other specified joint disorders, left shoulder: Secondary | ICD-10-CM | POA: Diagnosis not present

## 2014-08-02 HISTORY — PX: SHOULDER ARTHROSCOPY WITH ROTATOR CUFF REPAIR AND SUBACROMIAL DECOMPRESSION: SHX5686

## 2014-08-02 HISTORY — PX: STERIOD INJECTION: SHX5046

## 2014-08-02 SURGERY — STEROID INJECTION
Anesthesia: Regional | Site: Thumb | Laterality: Left

## 2014-08-02 MED ORDER — LIDOCAINE HCL (CARDIAC) 20 MG/ML IV SOLN
INTRAVENOUS | Status: DC | PRN
  Administered 2014-08-02: 100 mg via INTRAVENOUS

## 2014-08-02 MED ORDER — BUPIVACAINE HCL (PF) 0.25 % IJ SOLN
INTRAMUSCULAR | Status: DC | PRN
Start: 2014-08-02 — End: 2014-08-02
  Administered 2014-08-02: .5 mL via INTRA_ARTICULAR

## 2014-08-02 MED ORDER — CEFAZOLIN SODIUM-DEXTROSE 2-3 GM-% IV SOLR
INTRAVENOUS | Status: AC
  Filled 2014-08-02: qty 50

## 2014-08-02 MED ORDER — FENTANYL CITRATE 0.05 MG/ML IJ SOLN
50.0000 ug | INTRAMUSCULAR | Status: DC | PRN
  Administered 2014-08-02: 100 ug via INTRAVENOUS

## 2014-08-02 MED ORDER — OXYCODONE HCL 5 MG PO TABS
5.0000 mg | ORAL_TABLET | Freq: Once | ORAL | Status: AC | PRN
  Administered 2014-08-02: 5 mg via ORAL

## 2014-08-02 MED ORDER — CEFAZOLIN SODIUM-DEXTROSE 2-3 GM-% IV SOLR
2.0000 g | INTRAVENOUS | Status: AC
  Administered 2014-08-02: 2 g via INTRAVENOUS

## 2014-08-02 MED ORDER — OXYCODONE HCL 5 MG PO TABS
ORAL_TABLET | ORAL | Status: AC
  Filled 2014-08-02: qty 1

## 2014-08-02 MED ORDER — BUPIVACAINE-EPINEPHRINE (PF) 0.5% -1:200000 IJ SOLN
INTRAMUSCULAR | Status: DC | PRN
  Administered 2014-08-02: 10 mL via PERINEURAL

## 2014-08-02 MED ORDER — LACTATED RINGERS IV SOLN
INTRAVENOUS | Status: DC
  Administered 2014-08-02 (×2): via INTRAVENOUS

## 2014-08-02 MED ORDER — MIDAZOLAM HCL 2 MG/2ML IJ SOLN
1.0000 mg | INTRAMUSCULAR | Status: DC | PRN
  Administered 2014-08-02: 2 mg via INTRAVENOUS

## 2014-08-02 MED ORDER — FENTANYL CITRATE 0.05 MG/ML IJ SOLN
INTRAMUSCULAR | Status: AC
  Filled 2014-08-02: qty 2

## 2014-08-02 MED ORDER — FENTANYL CITRATE 0.05 MG/ML IJ SOLN
INTRAMUSCULAR | Status: AC
  Filled 2014-08-02: qty 6

## 2014-08-02 MED ORDER — LIDOCAINE HCL 4 % MT SOLN
OROMUCOSAL | Status: DC | PRN
  Administered 2014-08-02: 2 mL via TOPICAL

## 2014-08-02 MED ORDER — LACTATED RINGERS IV SOLN
INTRAVENOUS | Status: DC
Start: 2014-08-02 — End: 2014-08-02

## 2014-08-02 MED ORDER — DEXAMETHASONE SODIUM PHOSPHATE 4 MG/ML IJ SOLN
INTRAMUSCULAR | Status: DC | PRN
  Administered 2014-08-02: 10 mg via INTRAVENOUS

## 2014-08-02 MED ORDER — HYDROCODONE-ACETAMINOPHEN 5-325 MG PO TABS: 1.0000 | ORAL_TABLET | Freq: Four times a day (QID) | ORAL | Status: DC | PRN

## 2014-08-02 MED ORDER — METHYLPREDNISOLONE ACETATE 80 MG/ML IJ SUSP
INTRAMUSCULAR | Status: AC
  Filled 2014-08-02: qty 1

## 2014-08-02 MED ORDER — CHLORHEXIDINE GLUCONATE 4 % EX LIQD: 60.0000 mL | Freq: Once | CUTANEOUS | Status: DC

## 2014-08-02 MED ORDER — PROPOFOL 10 MG/ML IV BOLUS
INTRAVENOUS | Status: DC | PRN
  Administered 2014-08-02: 150 mg via INTRAVENOUS

## 2014-08-02 MED ORDER — SUCCINYLCHOLINE CHLORIDE 20 MG/ML IJ SOLN
INTRAMUSCULAR | Status: DC | PRN
  Administered 2014-08-02: 120 mg via INTRAVENOUS

## 2014-08-02 MED ORDER — MIDAZOLAM HCL 2 MG/2ML IJ SOLN
INTRAMUSCULAR | Status: AC
  Filled 2014-08-02: qty 2

## 2014-08-02 MED ORDER — ONDANSETRON HCL 4 MG/2ML IJ SOLN
INTRAMUSCULAR | Status: DC | PRN
  Administered 2014-08-02: 4 mg via INTRAVENOUS

## 2014-08-02 MED ORDER — METHYLPREDNISOLONE ACETATE 80 MG/ML IJ SUSP
INTRAMUSCULAR | Status: DC | PRN
  Administered 2014-08-02: 40 mg via INTRA_ARTICULAR

## 2014-08-02 MED ORDER — FENTANYL CITRATE 0.05 MG/ML IJ SOLN
INTRAMUSCULAR | Status: DC | PRN
  Administered 2014-08-02: 50 ug via INTRAVENOUS

## 2014-08-02 MED ORDER — SODIUM CHLORIDE 0.9 % IR SOLN
Status: DC | PRN
  Administered 2014-08-02: 6000 mL

## 2014-08-02 SURGICAL SUPPLY — 90 items
ANCHOR SUT POPLOK P1 3.5 (Anchor) ×4 IMPLANT
ANCHOR SUT POPLOK P1 3.5MM (Anchor) ×1 IMPLANT
BANDAGE ELASTIC 3 VELCRO ST LF (GAUZE/BANDAGES/DRESSINGS) ×5 IMPLANT
BANDAGE ELASTIC 4 VELCRO ST LF (GAUZE/BANDAGES/DRESSINGS) IMPLANT
BENZOIN TINCTURE PRP APPL 2/3 (GAUZE/BANDAGES/DRESSINGS) IMPLANT
BLADE CUDA 5.5 (BLADE) IMPLANT
BLADE GREAT WHITE 4.2 (BLADE) ×4 IMPLANT
BLADE GREAT WHITE 4.2MM (BLADE) ×1
BLADE MINI RND TIP GREEN BEAV (BLADE) IMPLANT
BLADE SURG 15 STRL LF DISP TIS (BLADE) IMPLANT
BLADE SURG 15 STRL SS (BLADE)
BNDG ESMARK 4X9 LF (GAUZE/BANDAGES/DRESSINGS) IMPLANT
BNDG GAUZE ELAST 4 BULKY (GAUZE/BANDAGES/DRESSINGS) IMPLANT
BUR VERTEX HOODED 4.5 (BURR) ×5 IMPLANT
CANNULA SHOULDER 7CM (CANNULA) ×5 IMPLANT
CANNULA TWIST IN 8.25X7CM (CANNULA) ×5 IMPLANT
CLOSURE WOUND 1/2 X4 (GAUZE/BANDAGES/DRESSINGS)
COVER BACK TABLE 60X90IN (DRAPES) IMPLANT
COVER MAYO STAND STRL (DRAPES) IMPLANT
CUFF TOURNIQUET SINGLE 18IN (TOURNIQUET CUFF) IMPLANT
CUFF TOURNIQUET SINGLE 24IN (TOURNIQUET CUFF) IMPLANT
DECANTER SPIKE VIAL GLASS SM (MISCELLANEOUS) IMPLANT
DRAPE EXTREMITY T 121X128X90 (DRAPE) IMPLANT
DRAPE STERI 35X30 U-POUCH (DRAPES) ×5 IMPLANT
DRAPE U-SHAPE 47X51 STRL (DRAPES) ×5 IMPLANT
DRAPE U-SHAPE 76X120 STRL (DRAPES) ×10 IMPLANT
DRSG EMULSION OIL 3X3 NADH (GAUZE/BANDAGES/DRESSINGS) ×5 IMPLANT
DRSG PAD ABDOMINAL 8X10 ST (GAUZE/BANDAGES/DRESSINGS) ×5 IMPLANT
DURAPREP 26ML APPLICATOR (WOUND CARE) ×5 IMPLANT
ELECT MENISCUS 165MM 90D (ELECTRODE) IMPLANT
ELECT REM PT RETURN 9FT ADLT (ELECTROSURGICAL) ×5
ELECTRODE REM PT RTRN 9FT ADLT (ELECTROSURGICAL) ×3 IMPLANT
GAUZE SPONGE 4X4 12PLY STRL (GAUZE/BANDAGES/DRESSINGS) ×5 IMPLANT
GLOVE BIO SURGEON STRL SZ 6.5 (GLOVE) ×4 IMPLANT
GLOVE BIO SURGEON STRL SZ8 (GLOVE) ×10 IMPLANT
GLOVE BIO SURGEONS STRL SZ 6.5 (GLOVE) ×1
GLOVE BIOGEL PI IND STRL 7.0 (GLOVE) ×3 IMPLANT
GLOVE BIOGEL PI IND STRL 8 (GLOVE) ×6 IMPLANT
GLOVE BIOGEL PI INDICATOR 7.0 (GLOVE) ×2
GLOVE BIOGEL PI INDICATOR 8 (GLOVE) ×4
GLOVE EXAM NITRILE MD LF STRL (GLOVE) ×5 IMPLANT
GOWN STRL REUS W/ TWL LRG LVL3 (GOWN DISPOSABLE) ×3 IMPLANT
GOWN STRL REUS W/ TWL XL LVL3 (GOWN DISPOSABLE) ×6 IMPLANT
GOWN STRL REUS W/TWL LRG LVL3 (GOWN DISPOSABLE) ×2
GOWN STRL REUS W/TWL XL LVL3 (GOWN DISPOSABLE) ×4
LIQUID BAND (GAUZE/BANDAGES/DRESSINGS) IMPLANT
MANIFOLD NEPTUNE II (INSTRUMENTS) ×5 IMPLANT
NDL SUT 6 .5 CRC .975X.05 MAYO (NEEDLE) IMPLANT
NEEDLE HYPO 25X1 1.5 SAFETY (NEEDLE) IMPLANT
NEEDLE MAYO TAPER (NEEDLE)
NEEDLE SCORPION MULTI FIRE (NEEDLE) ×5 IMPLANT
NS IRRIG 1000ML POUR BTL (IV SOLUTION) IMPLANT
PACK ARTHROSCOPY DSU (CUSTOM PROCEDURE TRAY) ×5 IMPLANT
PACK BASIN DAY SURGERY FS (CUSTOM PROCEDURE TRAY) ×5 IMPLANT
PADDING CAST ABS 4INX4YD NS (CAST SUPPLIES) ×2
PADDING CAST ABS COTTON 4X4 ST (CAST SUPPLIES) ×3 IMPLANT
PASSER SUT SWANSON 36MM LOOP (INSTRUMENTS) IMPLANT
PENCIL BUTTON HOLSTER BLD 10FT (ELECTRODE) IMPLANT
SET ARTHROSCOPY TUBING (MISCELLANEOUS) ×2
SET ARTHROSCOPY TUBING LN (MISCELLANEOUS) ×3 IMPLANT
SHEET MEDIUM DRAPE 40X70 STRL (DRAPES) IMPLANT
SLEEVE SCD COMPRESS KNEE MED (MISCELLANEOUS) ×5 IMPLANT
SLING ARM LRG ADULT FOAM STRAP (SOFTGOODS) IMPLANT
SLING ARM MED ADULT FOAM STRAP (SOFTGOODS) ×5 IMPLANT
SLING ARM SM FOAM STRAP (SOFTGOODS) IMPLANT
SLING ARM XL FOAM STRAP (SOFTGOODS) IMPLANT
SPONGE LAP 4X18 X RAY DECT (DISPOSABLE) IMPLANT
STOCKINETTE 4X48 STRL (DRAPES) IMPLANT
STRIP CLOSURE SKIN 1/2X4 (GAUZE/BANDAGES/DRESSINGS) IMPLANT
SUCTION FRAZIER TIP 10 FR DISP (SUCTIONS) IMPLANT
SUT ETHIBOND 2 OS 4 DA (SUTURE) IMPLANT
SUT ETHILON 3 0 PS 1 (SUTURE) ×5 IMPLANT
SUT ETHILON 4 0 PS 2 18 (SUTURE) IMPLANT
SUT FIBERWIRE #2 38 T-5 BLUE (SUTURE)
SUT PDS AB 2-0 CT2 27 (SUTURE) IMPLANT
SUT VIC AB 0 SH 27 (SUTURE) IMPLANT
SUT VIC AB 2-0 SH 27 (SUTURE)
SUT VIC AB 2-0 SH 27XBRD (SUTURE) IMPLANT
SUT VIC AB 4-0 P-3 18XBRD (SUTURE) IMPLANT
SUT VIC AB 4-0 P3 18 (SUTURE)
SUT VICRYL 4-0 PS2 18IN ABS (SUTURE) IMPLANT
SUTURE FIBERWR #2 38 T-5 BLUE (SUTURE) IMPLANT
SYR BULB 3OZ (MISCELLANEOUS) IMPLANT
SYR CONTROL 10ML LL (SYRINGE) IMPLANT
TOWEL OR 17X24 6PK STRL BLUE (TOWEL DISPOSABLE) ×10 IMPLANT
TOWEL OR NON WOVEN STRL DISP B (DISPOSABLE) IMPLANT
UNDERPAD 30X30 INCONTINENT (UNDERPADS AND DIAPERS) IMPLANT
WAND STAR VAC 90 (SURGICAL WAND) ×5 IMPLANT
WATER STERILE IRR 1000ML POUR (IV SOLUTION) ×5 IMPLANT
YANKAUER SUCT BULB TIP NO VENT (SUCTIONS) IMPLANT

## 2014-08-02 NOTE — Op Note (Signed)
#  [id number] 

## 2014-08-02 NOTE — Anesthesia Preprocedure Evaluation (Signed)
Anesthesia Evaluation Anesthesia Physical Anesthesia Plan  ASA: II  Anesthesia Plan:    Post-op Pain Management:    Induction:   Airway Management Planned:   Additional Equipment:   Intra-op Plan:   Post-operative Plan:   Informed Consent:   Plan Discussed with:   Anesthesia Plan Comments:         Anesthesia Quick Evaluation

## 2014-08-02 NOTE — Anesthesia Procedure Notes (Addendum)
Anesthesia Regional Block:  Interscalene brachial plexus block  Pre-Anesthetic Checklist: ,, timeout performed, Correct Patient, Correct Site, Correct Laterality, Correct Procedure, Correct Position, site marked, Risks and benefits discussed,  Surgical consent,  Pre-op evaluation,  At surgeon's request and post-op pain management  Laterality: Left  Prep: chloraprep       Needles:  Injection technique: Single-shot  Needle Type: Echogenic Stimulator Needle     Needle Length: 9cm 9 cm Needle Gauge: 21 and 21 G    Additional Needles:  Procedures: ultrasound guided (picture in chart) and nerve stimulator Interscalene brachial plexus block  Nerve Stimulator or Paresthesia:  Response: 0.4 mA,   Additional Responses:   Narrative:  Start time: 08/02/2014 1:40 PM End time: 08/02/2014 1:50 PM Injection made incrementally with aspirations every 5 mL. Anesthesiologist: Lillia Abed  Additional Notes: Monitors applied. Patient sedated. Sterile prep and drape,hand hygiene and sterile gloves were used. Relevant anatomy identified.Needle position confirmed.Local anesthetic injected incrementally after negative aspiration. Local anesthetic spread visualized around nerve(s). Vascular puncture avoided. No complications. Image printed for medical record.The patient tolerated the procedure well.        Procedure Name: Intubation Date/Time: 08/02/2014 2:39 PM Performed by: Baxter Flattery Pre-anesthesia Checklist: Patient identified, Emergency Drugs available, Suction available and Patient being monitored Patient Re-evaluated:Patient Re-evaluated prior to inductionOxygen Delivery Method: Circle System Utilized Preoxygenation: Pre-oxygenation with 100% oxygen Intubation Type: IV induction Ventilation: Mask ventilation without difficulty Laryngoscope Size: Miller and 2 Grade View: Grade II Tube type: Oral Tube size: 7.0 mm Number of attempts: 2 (Laryngoscopy, AOETT by Dr. Conrad  ) Airway  Equipment and Method: Stylet and LTA kit utilized Placement Confirmation: ETT inserted through vocal cords under direct vision,  positive ETCO2 and breath sounds checked- equal and bilateral Secured at: 22 cm Tube secured with: Tape Dental Injury: Teeth and Oropharynx as per pre-operative assessment

## 2014-08-02 NOTE — Discharge Instructions (Signed)
°  Post Anesthesia Home Care Instructions ° °Activity: °Get plenty of rest for the remainder of the day. A responsible adult should stay with you for 24 hours following the procedure.  °For the next 24 hours, DO NOT: °-Drive a car °-Operate machinery °-Drink alcoholic beverages °-Take any medication unless instructed by your physician °-Make any legal decisions or sign important papers. ° °Meals: °Start with liquid foods such as gelatin or soup. Progress to regular foods as tolerated. Avoid greasy, spicy, heavy foods. If nausea and/or vomiting occur, drink only clear liquids until the nausea and/or vomiting subsides. Call your physician if vomiting continues. ° °Special Instructions/Symptoms: °Your throat may feel dry or sore from the anesthesia or the breathing tube placed in your throat during surgery. If this causes discomfort, gargle with warm salt water. The discomfort should disappear within 24 hours. ° °Regional Anesthesia Blocks ° °1. Numbness or the inability to move the "blocked" extremity may last from 3-48 hours after placement. The length of time depends on the medication injected and your individual response to the medication. If the numbness is not going away after 48 hours, call your surgeon. ° °2. The extremity that is blocked will need to be protected until the numbness is gone and the  Strength has returned. Because you cannot feel it, you will need to take extra care to avoid injury. Because it may be weak, you may have difficulty moving it or using it. You may not know what position it is in without looking at it while the block is in effect. ° °3. For blocks in the legs and feet, returning to weight bearing and walking needs to be done carefully. You will need to wait until the numbness is entirely gone and the strength has returned. You should be able to move your leg and foot normally before you try and bear weight or walk. You will need someone to be with you when you first try to ensure you  do not fall and possibly risk injury. ° °4. Bruising and tenderness at the needle site are common side effects and will resolve in a few days. ° °5. Persistent numbness or new problems with movement should be communicated to the surgeon or the [facility] ([phone])/ [facility] ([phone]). °

## 2014-08-02 NOTE — Transfer of Care (Signed)
Immediate Anesthesia Transfer of Care Note  Patient: Marissa [name]  Procedure(s) Performed: Procedure(s): LEFT THUMB INJECTION (Left) SHOULDER ARTHROSCOPY WITH ROTATOR CUFF REPAIR AND SUBACROMIAL DECOMPRESSION (Left)  Patient Location: PACU  Anesthesia Type:GA combined with regional for post-op pain  Level of Consciousness: awake, alert  and patient cooperative  Airway & Oxygen Therapy: Patient Spontanous Breathing and Patient connected to face mask oxygen  Post-op Assessment: Report given to RN, Post -op Vital signs reviewed and stable and Patient moving all extremities  Post vital signs: Reviewed and stable  Last Vitals:  Filed Vitals:   08/02/14 1545  BP: 143/85  Pulse: 81  Temp: 36.7 C  Resp: 19    Complications: No apparent anesthesia complications

## 2014-08-02 NOTE — Anesthesia Postprocedure Evaluation (Signed)
Anesthesia Post Note  Patient: Marissa [name]  Procedure(s) Performed: Procedure(s) (LRB): LEFT THUMB INJECTION (Left) SHOULDER ARTHROSCOPY WITH ROTATOR CUFF REPAIR AND SUBACROMIAL DECOMPRESSION (Left)  Anesthesia type: general  Patient location: PACU  Post pain: Pain level controlled  Post assessment: Patient's Cardiovascular Status Stable  Last Vitals:  Filed Vitals:   08/02/14 1700  BP: 157/82  Pulse: 70  Temp: 36.9 C  Resp: 20    Post vital signs: Reviewed and stable  Level of consciousness: sedated  Complications: No apparent anesthesia complications

## 2014-08-02 NOTE — Interval H&P Note (Signed)
OK for surgery PD 

## 2014-08-02 NOTE — Progress Notes (Signed)
AssistedDr. Ossey with left, ultrasound guided, interscalene  block. Side rails up, monitors on throughout procedure. See vital signs in flow sheet. Tolerated Procedure well.  

## 2014-08-05 LAB — POCT HEMOGLOBIN-HEMACUE: Hemoglobin: 14.5 g/dL (ref 12.0–15.0)

## 2014-08-05 NOTE — Op Note (Signed)
NAME:  Marissa [name], Marissa [name]                   ACCOUNT NO.:  MEDICAL RECORD NO.:  [id number]  LOCATION:                                 FACILITY:  PHYSICIAN:  Monico Blitz. Rhona Raider, M.D.     DATE OF BIRTH:  DATE OF PROCEDURE:  08/02/2014 DATE OF DISCHARGE:                              OPERATIVE REPORT   PREOPERATIVE DIAGNOSES: 1. Left thumb basal joint degeneration. 2. Left shoulder impingement. 3. Left shoulder rotator cuff tear.  POSTOPERATIVE DIAGNOSES: 1. Left thumb basal joint degeneration. 2. Left shoulder impingement. 3. Left shoulder rotator cuff tear.  PROCEDURE: 1. Left wrist basal joint injection. 2. Left shoulder arthroscopic acromioplasty. 3. Left shoulder arthroscopic debridement. 4. Left shoulder arthroscopic rotator cuff repair.  ANESTHESIA:  General and block.  ATTENDING SURGEON:  Monico Blitz. Rhona Raider, MD  ASSISTANT:  Loni Dolly, PA  INDICATION FOR PROCEDURE:  The patient is a 54 year old woman with a long history of left shoulder pain.  This has persisted despite an injection which helped for a few hours.  She has also been through an exercise program.  She has pain which limits her ability to have rest and use her arm.  She is offered an arthroscopy of her shoulder with a probable rotator cuff repair.  She is also offered an injection under the left basal joint which has bothered her for sometime and this will be accomplished while she is under anesthesia.  Informed operative consent was obtained after discussion of possible complications including reaction to anesthesia and infection.  SUMMARY OF FINDINGS AND PROCEDURE:  Under general anesthesia and a block, a series of left arm procedures were performed.  We first injected the left basal joint with some Depo-Medrol and lidocaine.  A Band-Aid was applied.  We then performed an arthroscopy of the left shoulder.  Glenohumeral joint showed no degenerative changes, and the biceps tendon was intact.  In the  subacromial space, she had a great deal of bursitis and did have a small full-thickness tear of the anterior portion of the supraspinatus.  We repaired this with inverted mattress suture and secured with 1 PopLok anchor.  I also performed an acromioplasty.  Loni Dolly assisted throughout and was invaluable to the completion of the case mostly in that he passed instruments to make this possible in arthroscopic fashion.  DESCRIPTION OF PROCEDURE:  The patient was taken to the operating suite where general anesthetic was applied without difficulty.  She was also given a block in the pre-anesthesia area.  She was positioned in a beach- chair position and prepped and draped in normal sterile fashion.  After the administration of IV Kefzol and an appropriate time out, we performed an injection of the left basal joint.  I placed 0.5 mL of lidocaine and 40 mg of Depo-Medrol and a Band-Aid was applied.  We then performed an arthroscopy on the left shoulder.  Findings were as noted above and procedure consisted initially of debridement of a small rotator cuff tear and creation of bleeding bed of bone with a bur.  I also performed an arthroscopic acromioplasty with the bur in the lateral position followed by transfer of bur to the  posterior position.  I placed an inverted mattress suture of hi-fi suture and secured this rotator cuff tissue down to the bleeding bed of bone with 1 PopLok anchor by Linvatec.  This gave [place] a nice tight repair.  The shoulder was thoroughly irrigated followed by removal of arthroscopic equipment. Simple sutures of nylon were used to loosely reapproximate the portals followed by Adaptic, dry gauze and tape.  Estimated blood loss and intraoperative fluids can be obtained from anesthesia records.  DISPOSITION:  The patient was extubated in the operating room and taken to recovery in stable condition.  She was to go home the same-day and follow up in the office in less  than a week.  I will contact her by phone tonight.     Monico Blitz Rhona Raider, M.D.     PGD/MEDQ  D:  08/02/2014  T:  08/03/2014  Job:  [id number]

## 2014-08-06 ENCOUNTER — Encounter (HOSPITAL_BASED_OUTPATIENT_CLINIC_OR_DEPARTMENT_OTHER): Payer: Self-pay | Admitting: Orthopaedic Surgery

## 2014-09-10 ENCOUNTER — Other Ambulatory Visit: Payer: Self-pay

## 2014-09-10 DIAGNOSIS — Z1231 Encounter for screening mammogram for malignant neoplasm of breast: Secondary | ICD-10-CM

## 2014-10-09 ENCOUNTER — Ambulatory Visit
Admission: RE | Admit: 2014-10-09 | Discharge: 2014-10-09 | Disposition: A | Discharge status: Home / Self Care | Payer: 59 | Source: Ambulatory Visit

## 2014-10-09 ENCOUNTER — Encounter (INDEPENDENT_AMBULATORY_CARE_PROVIDER_SITE_OTHER): Payer: Self-pay

## 2014-10-09 DIAGNOSIS — Z1231 Encounter for screening mammogram for malignant neoplasm of breast: Secondary | ICD-10-CM

## 2014-12-10 ENCOUNTER — Emergency Department
Admission: EM | Admit: 2014-12-10 | Discharge: 2014-12-10 | Disposition: A | Discharge status: Home / Self Care | Payer: 59 | Source: Home / Self Care | Attending: Emergency Medicine | Admitting: Emergency Medicine

## 2014-12-10 ENCOUNTER — Encounter: Payer: Self-pay | Admitting: *Deleted

## 2014-12-10 DIAGNOSIS — J0100 Acute maxillary sinusitis, unspecified: Secondary | ICD-10-CM

## 2014-12-10 HISTORY — DX: Hyperlipidemia, unspecified: E78.5

## 2014-12-10 MED ORDER — FLUTICASONE PROPIONATE 50 MCG/ACT NA SUSP: NASAL | Status: DC

## 2014-12-10 MED ORDER — PSEUDOEPHEDRINE-GUAIFENESIN ER 120-1200 MG PO TB12: 1.0000 | ORAL_TABLET | Freq: Two times a day (BID) | ORAL | Status: DC

## 2014-12-10 MED ORDER — PROMETHAZINE-CODEINE 6.25-10 MG/5ML PO SYRP: ORAL_SOLUTION | ORAL | Status: DC

## 2014-12-10 MED ORDER — AMOXICILLIN 875 MG PO TABS: ORAL_TABLET | ORAL | Status: DC

## 2014-12-10 NOTE — ED Provider Notes (Signed)
CSN: [id number]     Arrival date & time 12/10/14  1310 History   First MD Initiated Contact with Patient 12/10/14 1322     Chief Complaint  Patient presents with  . Headache  . Nasal Congestion   (Consider location/radiation/quality/duration/timing/severity/associated sxs/prior Treatment) HPI SINUSITIS  Onset: 2 days Facial/sinus pressure with discolored nasal mucus.    Severity: moderate-severe, progressively worsening today. Tried OTC meds without significant relief.  Symptoms:  + Fever  + URI prodrome with nasal congestion + Minimal swollen neck glands + mild Sinus Headache + mild ear pressure  No Allergy symptoms No significant Sore Throat No eye symptoms     Has nonproductive cough that keeps her up at night and she requests a prescription for Phenergan With Codeine cough syrup. No chest pain No shortness of breath  No wheezing  No Abdominal Pain No Nausea No Vomiting No diarrhea  No Myalgias No focal neurologic symptoms No syncope No Rash  No Urinary symptoms  Remainder of Review of Systems negative for acute change except as noted in the HPI.  Past Medical History  Diagnosis Date  . GERD (gastroesophageal reflux disease)   . Genital warts 1994    Tx  x2  . Dysmenorrhea     2000  . Fibroid   . Anxiety   . Asthma     exercise induced-no inhalers  . Sleep apnea     has a cpap-cannot use-makes her sick  . Hyperlipidemia    Past Surgical History  Procedure Laterality Date  . Reconstruction of nose  1986  . Shoulder surgery  1994    rt  . Abdominal hysterectomy  2004    ovaries retained. 2o fibroids  . Appendectomy  1980  . Breast surgery  1997    cyst left breast rem. benign  . Colonoscopy    . Knee arthroscopy Right 07/16/2013    Procedure: RIGHT ARTHROSCOPY KNEE, partial lateral menisectomy and chrondromalasia, with microfracture of lateral femeral chondyl, and excision of plica ;  Surgeon: Kerin Salen, MD;  Location: [facility];  Service: Orthopedics;  Laterality: Right;  . Steriod injection Left 08/02/2014    Procedure: LEFT THUMB INJECTION;  Surgeon: Hessie Dibble, MD;  Location: [place];  Service: Orthopedics;  Laterality: Left;  . Shoulder arthroscopy with rotator cuff repair and subacromial decompression Left 08/02/2014    Procedure: SHOULDER ARTHROSCOPY WITH ROTATOR CUFF REPAIR AND SUBACROMIAL DECOMPRESSION;  Surgeon: Hessie Dibble, MD;  Location: [place];  Service: Orthopedics;  Laterality: Left;   Family History  Problem Relation Age of Onset  . Diabetes Mother   . Hyperlipidemia Mother   . Hypertension Mother   . Ulcers Mother   . Fibromyalgia Mother   . Sudden death Maternal Grandfather   . Heart attack Neg Hx   . Cancer Maternal Grandmother   . Cancer Paternal Grandmother     breast/liver   History  Substance Use Topics  . Smoking status: Never Smoker   . Smokeless tobacco: Never Used  . Alcohol Use: 3.6 oz/week    2 Glasses of wine, 3 Cans of beer, 1 Shots of liquor per week     Comment: 4 drinks a week   OB History    Gravida Para Term Preterm AB TAB SAB Ectopic Multiple Living   0              Review of Systems  Allergies  Hydrocodone  Home Medications  Prior to Admission medications   Medication Sig Start Date End Date Taking? Authorizing Provider  omeprazole-sodium bicarbonate (ZEGERID) 40-1100 MG per capsule Take 1 capsule by mouth daily before breakfast.     Yes Historical Provider, MD  Probiotic Product (PROBIOTIC PO) Take by mouth.   Yes Historical Provider, MD  amoxicillin (AMOXIL) 875 MG tablet Take 1 twice a day X 10 days. 12/10/14   Jacqulyn Cane, MD  fluticasone Asencion Islam) 50 MCG/ACT nasal spray 1 or 2 sprays each nostril twice a day 12/10/14   Jacqulyn Cane, MD  Multiple Vitamin (MULTIVITAMIN) tablet Take 1 tablet by mouth daily.      Historical Provider, MD  Omega-3 Fatty Acids (FISH OIL) 1200 MG CAPS Take 1 capsule by mouth  daily.      Historical Provider, MD  promethazine-codeine (PHENERGAN WITH CODEINE) 6.25-10 MG/5ML syrup Take 1-2 teaspoons every 4-6 hours as needed for cough. May cause drowsiness. 12/10/14   Jacqulyn Cane, MD  Pseudoephedrine-Guaifenesin 2177888220 MG TB12 Take 1 tablet by mouth 2 (two) times daily. As needed for congestion. (Caution: Do not take if blood pressure is elevated) 12/10/14   Jacqulyn Cane, MD  saccharomyces boulardii (FLORASTOR) 250 MG capsule Take 250 mg by mouth 2 (two) times daily.    Historical Provider, MD   BP 119/83 mmHg  Pulse 78  Temp(Src) 98.7 F (37.1 C) (Oral)  Resp 18  Ht 5\' 4"  (1.626 m)  Wt 238 lb (107.956 kg)  BMI 40.83 kg/m2  SpO2 98%  LMP 08/20/2002 Physical Exam  Constitutional: She is oriented to person, place, and time. She appears well-developed and well-nourished. No distress.  Occasional hacking nonproductive cough noted  HENT:  Head: Normocephalic and atraumatic.  Right Ear: Tympanic membrane, external ear and ear canal normal.  Left Ear: Tympanic membrane, external ear and ear canal normal.  Nose: Mucosal edema and rhinorrhea present. Right sinus exhibits maxillary sinus tenderness. Left sinus exhibits maxillary sinus tenderness.  Mouth/Throat: Oropharynx is clear and moist. No oral lesions. No oropharyngeal exudate.  Eyes: Right eye exhibits no discharge. Left eye exhibits no discharge. No scleral icterus.  Neck: Neck supple.  Cardiovascular: Normal rate, regular rhythm and normal heart sounds.   Pulmonary/Chest: Effort normal and breath sounds normal. No respiratory distress. She has no wheezes. She has no rales.  Abdominal: She exhibits no distension.  Lymphadenopathy:    She has no cervical adenopathy.  Neurological: She is alert and oriented to person, place, and time.  Skin: Skin is warm and dry. No rash noted.  Psychiatric: She has a normal mood and affect.  Nursing note and vitals reviewed.   ED Course  Procedures (including critical  care time) Labs Review Labs Reviewed - No data to display  Imaging Review No results found.   MDM   1. Acute maxillary sinusitis, recurrence not specified    Treatment options discussed, as well as risks, benefits, alternatives. Patient voiced understanding and agreement with the following plans: Discharge Medication List as of 12/10/2014  1:36 PM    START taking these medications   Details  amoxicillin (AMOXIL) 875 MG tablet Take 1 twice a day X 10 days., Print    fluticasone (FLONASE) 50 MCG/ACT nasal spray 1 or 2 sprays each nostril twice a day, Print    promethazine-codeine (PHENERGAN WITH CODEINE) 6.25-10 MG/5ML syrup Take 1-2 teaspoons every 4-6 hours as needed for cough. May cause drowsiness., Print    Pseudoephedrine-Guaifenesin [id number] MG TB12 Take 1 tablet by mouth 2 (two) times daily.  As needed for congestion. (Caution: Do not take if blood pressure is elevated), Starting 12/10/2014, Until Discontinued, Print       Follow-up with your primary care doctor in 5-7 days if not improving, or sooner if symptoms become worse. Precautions discussed. Red flags discussed. Questions invited and answered. Patient voiced understanding and agreement.     Jacqulyn Cane, MD 12/10/14 2122

## 2014-12-10 NOTE — ED Notes (Signed)
Pt c/o sneezing, nasal congestion, and HA x 1 day

## 2015-03-14 ENCOUNTER — Emergency Department
Admission: EM | Admit: 2015-03-14 | Discharge: 2015-03-14 | Disposition: A | Discharge status: Home / Self Care | Payer: 59 | Source: Home / Self Care | Attending: Family Medicine | Admitting: Family Medicine

## 2015-03-14 ENCOUNTER — Encounter: Payer: Self-pay | Admitting: *Deleted

## 2015-03-14 DIAGNOSIS — R42 Dizziness and giddiness: Secondary | ICD-10-CM

## 2015-03-14 MED ORDER — MECLIZINE HCL 25 MG PO TABS: 25.0000 mg | ORAL_TABLET | Freq: Three times a day (TID) | ORAL | Status: DC | PRN

## 2015-03-14 NOTE — ED Provider Notes (Signed)
CSN: [id number]     Arrival date & time 03/14/15  0940 History   First MD Initiated Contact with Patient 03/14/15 1008     Chief Complaint  Patient presents with  . Dizziness   (Consider location/radiation/quality/duration/timing/severity/associated sxs/prior Treatment) HPI  Pt is a 54yo female presenting to [facility] with c/o dizziness, described as "room spinning" for 1 week.  Pt states symptoms were initially worse in the morning but states they are constant now and wax and wane throughout the day.  Pt reports hx of chronic tinnitus, this has not changed in severity.  Denies ear pain, change in vision or eye pain.  Reports mild intermittent frontal headache. Denies nausea or vomiting. Does report change in balance at times when dizziness is most severe, improves within a few minutes or sitting and standing still.  Movement makes symptoms worse. Denies prior hx of vertigo. Denies numbness or weakness in arms or legs. Denies syncopal episodes. Denies hx of migraines. Denies hx of recent illness including, cough, congestion or sore throat.  She has not taken any medications for her dizziness.  Denies recent falls or head trauma.  Past Medical History  Diagnosis Date  . GERD (gastroesophageal reflux disease)   . Genital warts 1994    Tx  x2  . Dysmenorrhea     2000  . Fibroid   . Anxiety   . Asthma     exercise induced-no inhalers  . Sleep apnea     has a cpap-cannot use-makes her sick  . Hyperlipidemia    Past Surgical History  Procedure Laterality Date  . Reconstruction of nose  1986  . Shoulder surgery  1994    rt  . Abdominal hysterectomy  2004    ovaries retained. 2o fibroids  . Appendectomy  1980  . Breast surgery  1997    cyst left breast rem. benign  . Colonoscopy    . Knee arthroscopy Right 07/16/2013    Procedure: RIGHT ARTHROSCOPY KNEE, partial lateral menisectomy and chrondromalasia, with microfracture of lateral femeral chondyl, and excision of plica ;  Surgeon: Kerin Salen, MD;  Location: [place];  Service: Orthopedics;  Laterality: Right;  . Steriod injection Left 08/02/2014    Procedure: LEFT THUMB INJECTION;  Surgeon: Hessie Dibble, MD;  Location: [place];  Service: Orthopedics;  Laterality: Left;  . Shoulder arthroscopy with rotator cuff repair and subacromial decompression Left 08/02/2014    Procedure: SHOULDER ARTHROSCOPY WITH ROTATOR CUFF REPAIR AND SUBACROMIAL DECOMPRESSION;  Surgeon: Hessie Dibble, MD;  Location: [place];  Service: Orthopedics;  Laterality: Left;   Family History  Problem Relation Age of Onset  . Diabetes Mother   . Hyperlipidemia Mother   . Hypertension Mother   . Ulcers Mother   . Fibromyalgia Mother   . Sudden death Maternal Grandfather   . Heart attack Neg Hx   . Cancer Maternal Grandmother   . Cancer Paternal Grandmother     breast/liver   Social History  Substance Use Topics  . Smoking status: Never Smoker   . Smokeless tobacco: Never Used  . Alcohol Use: 3.6 oz/week    2 Glasses of wine, 3 Cans of beer, 1 Shots of liquor per week     Comment: 4 drinks a week   OB History    Gravida Para Term Preterm AB TAB SAB Ectopic Multiple Living   0  Review of Systems  Constitutional: Negative for fever and chills.  HENT: Positive for tinnitus ( chronic). Negative for congestion, ear pain, sore throat, trouble swallowing and voice change.   Eyes: Negative for photophobia, pain, redness and visual disturbance.  Respiratory: Negative for cough and shortness of breath.   Cardiovascular: Negative for chest pain and palpitations.  Gastrointestinal: Negative for nausea, vomiting, abdominal pain and diarrhea.  Musculoskeletal: Negative for myalgias, back pain and arthralgias.  Neurological: Positive for dizziness and headaches. Negative for syncope, weakness, light-headedness and numbness.  All other systems reviewed and are negative.   Allergies   Hydrocodone  Home Medications   Prior to Admission medications   Medication Sig Start Date End Date Taking? Authorizing Provider  meclizine (ANTIVERT) 25 MG tablet Take 1 tablet (25 mg total) by mouth 3 (three) times daily as needed for dizziness. 03/14/15   Noland Fordyce, PA-C   Meds Ordered and Administered this Visit  Medications - No data to display  BP 151/90 mmHg  Pulse 54  Resp 16  Wt 244 lb (110.678 kg)  SpO2 99%  LMP 08/20/2002 Orthostatic VS for the past 24 hrs:  BP- Lying Pulse- Lying BP- Sitting Pulse- Sitting BP- Standing at 0 minutes Pulse- Standing at 0 minutes  03/14/15 1027 (!) 138/92 mmHg 52 (!) 145/92 mmHg 58 (!) 140/98 mmHg 66    Physical Exam  Constitutional: She is oriented to person, place, and time. She appears well-developed and well-nourished. No distress.  HENT:  Head: Normocephalic and atraumatic.  Eyes: Conjunctivae and EOM are normal. Pupils are equal, round, and reactive to light. No scleral icterus.  Neck: Normal range of motion. Neck supple.  Cardiovascular: Regular rhythm and normal heart sounds.  Bradycardia present.   Mild bradycardia  Pulmonary/Chest: Effort normal and breath sounds normal. No respiratory distress. She has no wheezes. She has no rales. She exhibits no tenderness.  Abdominal: Soft. She exhibits no distension and no mass. There is no tenderness. There is no rebound and no guarding.  Musculoskeletal: Normal range of motion.  Neurological: She is alert and oriented to person, place, and time. She has normal strength. No cranial nerve deficit or sensory deficit. She displays a negative Romberg sign. Coordination and gait normal. GCS eye subscore is 4. GCS verbal subscore is 5. GCS motor subscore is 6.   CN II-XII in tact. Speech is clear. Alert to person, place and time. Normal finger to nose coordination. Normal sensation with 5/5 strength in all extremities bilaterally. Normal gait   Skin: Skin is warm and dry. She is not  diaphoretic.  Nursing note and vitals reviewed.   ED Course  Procedures (including critical care time)  Labs Review Labs Reviewed - No data to display  Imaging Review No results found.    MDM   1. Dizziness    Pt c/o dizziness described as "room spinning" for 1 week. Pt appears well, non-toxic. NAD. Vitals: unremarkable. Pt denies chest pain, shortness of break, weakness or numbness in arms or legs. No recent head trauma. Normal neuro exam.   Orthostatic vitals: unremarkable, however, pt did have mild worsening in her dizziness with position change. Symptoms most likely BPPV.  Doubt emergent process taking place at this time, including but not limited to CVA or SAH. Will tx with Meclizine. No medication given in UC as pt drove herself. Encouraged pt to take medication today and rest. F/u with PCP in 2-3 days if not improving. Discussed symptoms that warrant emergent care in the  ED. Patient verbalized understanding and agreement with treatment plan.    Noland Fordyce, PA-C 03/14/15 1036

## 2015-03-14 NOTE — Discharge Instructions (Signed)
Call 911  Dizziness  Dizziness means you feel unsteady or lightheaded. You might feel like you are going to pass out (faint). HOME CARE   Drink enough fluids to keep your pee (urine) clear or pale yellow.  Take your medicines exactly as told by your doctor. If you take blood pressure medicine, always stand up slowly from the lying or sitting position. Hold on to something to steady yourself.  If you need to stand in one place for a long time, move your legs often. Tighten and relax your leg muscles.  Have someone stay with you until you feel okay.  Do not drive or use heavy machinery if you feel dizzy.  Do not drink alcohol. GET HELP RIGHT AWAY IF:   You feel dizzy or lightheaded and it gets worse.  You feel sick to your stomach (nauseous), or you throw up (vomit).  You have trouble talking or walking.  You feel weak or have trouble using your arms, hands, or legs.  You cannot think clearly or have trouble forming sentences.  You have chest pain, belly (abdominal) pain, sweating, or you are short of breath.  Your vision changes.  You are bleeding.  You have problems from your medicine that seem to be getting worse. MAKE SURE YOU:   Understand these instructions.  Will watch your condition.  Will get help right away if you are not doing well or get worse. Document Released: 05/27/2011 Document Revised: 08/30/2011 Document Reviewed: 05/27/2011 [facility] Patient Information 2015 [place], [place]. This information is not intended to replace advice given to you by your health care provider. Make sure you discuss any questions you have with your health care provider.  Benign Positional Vertigo Vertigo means you feel like you or your surroundings are moving when they are not. Benign positional vertigo is the most common form of vertigo. Benign means that the cause of your condition is not serious. Benign positional vertigo is more common in older adults. CAUSES  Benign  positional vertigo is the result of an upset in the labyrinth system. This is an area in the middle ear that helps control your balance. This may be caused by a viral infection, head injury, or repetitive motion. However, often no specific cause is found. SYMPTOMS  Symptoms of benign positional vertigo occur when you move your head or eyes in different directions. Some of the symptoms may include:  Loss of balance and falls.  Vomiting.  Blurred vision.  Dizziness.  Nausea.  Involuntary eye movements (nystagmus). DIAGNOSIS  Benign positional vertigo is usually diagnosed by physical exam. If the specific cause of your benign positional vertigo is unknown, your caregiver may perform imaging tests, such as magnetic resonance imaging (MRI) or computed tomography (CT). TREATMENT  Your caregiver may recommend movements or procedures to correct the benign positional vertigo. Medicines such as meclizine, benzodiazepines, and medicines for nausea may be used to treat your symptoms. In rare cases, if your symptoms are caused by certain conditions that affect the inner ear, you may need surgery. HOME CARE INSTRUCTIONS   Follow your caregiver's instructions.  Move slowly. Do not make sudden body or head movements.  Avoid driving.  Avoid operating heavy machinery.  Avoid performing any tasks that would be dangerous to you or others during a vertigo episode.  Drink enough fluids to keep your urine clear or pale yellow. SEEK IMMEDIATE MEDICAL CARE IF:   You develop problems with walking, weakness, numbness, or using your arms, hands, or legs.  You  have difficulty speaking.  You develop severe headaches.  Your nausea or vomiting continues or gets worse.  You develop visual changes.  Your family or friends notice any behavioral changes.  Your condition gets worse.  You have a fever.  You develop a stiff neck or sensitivity to light. MAKE SURE YOU:   Understand these  instructions.  Will watch your condition.  Will get help right away if you are not doing well or get worse. Document Released: 03/15/2006 Document Revised: 08/30/2011 Document Reviewed: 02/25/2011 [facility] Patient Information 2015 [place], [place]. This information is not intended to replace advice given to you by your health care provider. Make sure you discuss any questions you have with your health care provider. or go to closest ER if symptoms worsen including no improvement with medication, severe headache, vomiting or other new symptoms develop.

## 2015-03-14 NOTE — ED Notes (Signed)
Pt c/o positional change dizziness x 1 week. Minimal HA at times. Denies ear pain.

## 2015-06-22 HISTORY — PX: COLONOSCOPY: SHX174

## 2015-10-31 ENCOUNTER — Encounter: Payer: Self-pay | Admitting: *Deleted

## 2015-10-31 ENCOUNTER — Emergency Department
Admission: EM | Admit: 2015-10-31 | Discharge: 2015-10-31 | Disposition: A | Discharge status: Home / Self Care | Payer: 59 | Source: Home / Self Care | Attending: Emergency Medicine | Admitting: Emergency Medicine

## 2015-10-31 DIAGNOSIS — J209 Acute bronchitis, unspecified: Secondary | ICD-10-CM | POA: Diagnosis not present

## 2015-10-31 MED ORDER — METHYLPREDNISOLONE ACETATE 80 MG/ML IJ SUSP
80.0000 mg | Freq: Once | INTRAMUSCULAR | Status: AC
  Administered 2015-10-31: 80 mg via INTRAMUSCULAR

## 2015-10-31 MED ORDER — CLARITHROMYCIN 500 MG PO TABS
ORAL_TABLET | ORAL | Status: DC
Start: 2015-10-31 — End: 2015-12-24

## 2015-10-31 MED ORDER — IPRATROPIUM-ALBUTEROL 0.5-2.5 (3) MG/3ML IN SOLN
3.0000 mL | RESPIRATORY_TRACT | Status: AC
  Administered 2015-10-31: 3 mL via RESPIRATORY_TRACT

## 2015-10-31 MED ORDER — PREDNISONE 50 MG PO TABS: 50.0000 mg | ORAL_TABLET | Freq: Every day | ORAL | Status: AC

## 2015-10-31 MED ORDER — PROMETHAZINE-CODEINE 6.25-10 MG/5ML PO SYRP: ORAL_SOLUTION | ORAL | Status: DC

## 2015-10-31 NOTE — ED Notes (Signed)
Pt c/o productive cough and congestion without fever x 2 1/2 weeks. Taken Theraflu, Day/Nyquil, Delsym and Mucinex.

## 2015-11-03 NOTE — ED Provider Notes (Signed)
CSN: [id number]     Arrival date & time 10/31/15  1413 History   First MD Initiated Contact with Patient 10/31/15 1430     Chief Complaint  Patient presents with  . Cough   (Consider location/radiation/quality/duration/timing/severity/associated sxs/prior Treatment) HPI URI HISTORY  Marissa [name] is a 55 y.o. female who complains of onset of chest congestion, cough symptoms for 2 and 1/2 weeks. Progressively worsening.  Have been using over-the-counter treatment,Theraflu, Day/Nyquil, Delsym and Mucinex., which helps a little.  No chills/sweats +  Fever  +  Nasal congestion +  Discolored Post-nasal drainage No sinus pain/pressure No sore throat  +  Cough, yellow-green sputum Mild wheezing + chest congestion No hemoptysis No shortness of breath No pleuritic pain  No itchy/red eyes No earache  No nausea No vomiting No abdominal pain No diarrhea  No skin rashes +  Fatigue No myalgias No headache   Past Medical History  Diagnosis Date  . GERD (gastroesophageal reflux disease)   . Genital warts 1994    Tx  x2  . Dysmenorrhea     2000  . Fibroid   . Anxiety   . Asthma     exercise induced-no inhalers  . Sleep apnea     has a cpap-cannot use-makes her sick  . Hyperlipidemia    Past Surgical History  Procedure Laterality Date  . Reconstruction of nose  1986  . Shoulder surgery  1994    rt  . Abdominal hysterectomy  2004    ovaries retained. 2o fibroids  . Appendectomy  1980  . Breast surgery  1997    cyst left breast rem. benign  . Colonoscopy    . Knee arthroscopy Right 07/16/2013    Procedure: RIGHT ARTHROSCOPY KNEE, partial lateral menisectomy and chrondromalasia, with microfracture of lateral femeral chondyl, and excision of plica ;  Surgeon: Kerin Salen, MD;  Location: [place];  Service: Orthopedics;  Laterality: Right;  . Steriod injection Left 08/02/2014    Procedure: LEFT THUMB INJECTION;  Surgeon: Hessie Dibble, MD;  Location:  [place];  Service: Orthopedics;  Laterality: Left;  . Shoulder arthroscopy with rotator cuff repair and subacromial decompression Left 08/02/2014    Procedure: SHOULDER ARTHROSCOPY WITH ROTATOR CUFF REPAIR AND SUBACROMIAL DECOMPRESSION;  Surgeon: Hessie Dibble, MD;  Location: [place];  Service: Orthopedics;  Laterality: Left;   Family History  Problem Relation Age of Onset  . Diabetes Mother   . Hyperlipidemia Mother   . Hypertension Mother   . Ulcers Mother   . Fibromyalgia Mother   . Sudden death Maternal Grandfather   . Heart attack Neg Hx   . Cancer Maternal Grandmother   . Cancer Paternal Grandmother     breast/liver   Social History  Substance Use Topics  . Smoking status: Never Smoker   . Smokeless tobacco: Never Used  . Alcohol Use: 3.6 oz/week    2 Glasses of wine, 3 Cans of beer, 1 Shots of liquor per week     Comment: 4 drinks a week   OB History    Gravida Para Term Preterm AB TAB SAB Ectopic Multiple Living   0              Review of Systems  All other systems reviewed and are negative.   Allergies  Hydrocodone  (can take codeine)  Home Medications   Prior to Admission medications   Medication Sig Start Date End Date Taking? Authorizing Provider  clarithromycin (BIAXIN) 500 MG tablet Take 1 twice a day for 10 days. 10/31/15   Jacqulyn Cane, MD  predniSONE (DELTASONE) 50 MG tablet Take 1 tablet (50 mg total) by mouth daily with breakfast. For 5 days.--Start taking Saturday, 10/31/2068 10/31/15 11/08/15  Jacqulyn Cane, MD  promethazine-codeine (PHENERGAN WITH CODEINE) 6.25-10 MG/5ML syrup Take 1-2 teaspoons every 4-6 hours as needed for cough. May cause drowsiness. 10/31/15   Jacqulyn Cane, MD   Meds Ordered and Administered this Visit   Medications  ipratropium-albuterol (DUONEB) 0.5-2.5 (3) MG/3ML nebulizer solution 3 mL (3 mLs Nebulization Given 10/31/15 1504)  methylPREDNISolone acetate (DEPO-MEDROL) injection 80 mg (80 mg  Intramuscular Given 10/31/15 1504)    BP 144/90 mmHg  Pulse 67  Temp(Src) 98.3 F (36.8 C) (Oral)  Resp 16  Wt 232 lb (105.235 kg)  SpO2 98%  LMP 08/20/2002 No data found.   Physical Exam  Constitutional: She is oriented to person, place, and time. She appears well-developed and well-nourished. No distress.  HENT:  Head: Normocephalic and atraumatic.  Right Ear: Tympanic membrane normal.  Left Ear: Tympanic membrane normal.  Nose: Nose normal.  Mouth/Throat: Oropharynx is clear and moist. No oropharyngeal exudate.  Eyes: Right eye exhibits no discharge. Left eye exhibits no discharge. No scleral icterus.  Neck: Neck supple.  Cardiovascular: Normal rate, regular rhythm and normal heart sounds.   Pulmonary/Chest: No respiratory distress. She has wheezes. She has rhonchi. She has no rales.  Lymphadenopathy:    She has no cervical adenopathy.  Neurological: She is alert and oriented to person, place, and time.  Skin: Skin is warm and dry.  Nursing note and vitals reviewed.   ED Course  Procedures (including critical care time)  Labs Review Labs Reviewed - No data to display  Imaging Review No results found.   Visual Acuity Review  Right Eye Distance:   Left Eye Distance:   Bilateral Distance:    Right Eye Near:   Left Eye Near:    Bilateral Near:         MDM   1. Bronchitis, acute, with bronchospasm    Treatment options discussed, as well as risks, benefits, alternatives. Patient voiced understanding and agreement with the following plans: DuoNeb nebulizer treatment given. Wheezing improved. Depomedrol 80 IM. Discharge Medication List as of 10/31/2015  3:27 PM    START taking these medications   Details  clarithromycin (BIAXIN) 500 MG tablet Take 1 twice a day for 10 days., Print    predniSONE (DELTASONE) 50 MG tablet Take 1 tablet (50 mg total) by mouth daily with breakfast. For 5 days.--Start taking Saturday, 10/31/2068, Starting 10/31/2015, Until Sat  11/08/15, Print    promethazine-codeine (PHENERGAN WITH CODEINE) 6.25-10 MG/5ML syrup Take 1-2 teaspoons every 4-6 hours as needed for cough. May cause drowsiness., Print      otc Flonase for sinus allergy sxs. Follow-up with your primary care doctor in 5-7 days if not improving, or sooner if symptoms become worse. Precautions discussed. Red flags discussed. Questions invited and answered. Patient voiced understanding and agreement.     Jacqulyn Cane, MD 11/03/15 [phone]

## 2015-11-11 ENCOUNTER — Encounter: Payer: Self-pay | Admitting: Emergency Medicine

## 2015-11-11 ENCOUNTER — Emergency Department (INDEPENDENT_AMBULATORY_CARE_PROVIDER_SITE_OTHER): Payer: 59

## 2015-11-11 ENCOUNTER — Emergency Department
Admission: EM | Admit: 2015-11-11 | Discharge: 2015-11-11 | Disposition: A | Discharge status: Home / Self Care | Payer: 59 | Source: Home / Self Care | Attending: Family Medicine | Admitting: Family Medicine

## 2015-11-11 DIAGNOSIS — R05 Cough: Secondary | ICD-10-CM

## 2015-11-11 DIAGNOSIS — R053 Chronic cough: Secondary | ICD-10-CM

## 2015-11-11 MED ORDER — FLUTICASONE PROPIONATE HFA 44 MCG/ACT IN AERO: 2.0000 | INHALATION_SPRAY | Freq: Two times a day (BID) | RESPIRATORY_TRACT | Status: DC

## 2015-11-11 NOTE — Discharge Instructions (Signed)
May add Pseudoephedrine (30mg , one or two every 4 to 6 hours) for sinus congestion.    May use Afrin nasal spray (or generic oxymetazoline) twice daily for about 5 days and then discontinue (may use Afrin nose spray prior to SCUBA diving).  Also recommend using saline nasal spray several times daily and saline nasal irrigation (AYR is a common brand).  Use Flonase nasal spray each morning after using Afrin nasal spray and saline nasal irrigation.

## 2015-11-11 NOTE — ED Provider Notes (Signed)
CSN: [id number]     Arrival date & time 11/11/15  1539 History   First MD Initiated Contact with Patient 11/11/15 1644     Chief Complaint  Patient presents with  . Cough      HPI Comments: Patient was treated for bronchitis 11 days ago with Biaxin and prednisone.  She generally improved but complains of persistent chest congestion and cough.  No pleuritic pain or fever.  She states that she is leaving for a SCUBA diving trip in 4 days.  She has a history of asthma.  The history is provided by the patient.    Past Medical History  Diagnosis Date  . GERD (gastroesophageal reflux disease)   . Genital warts 1994    Tx  x2  . Dysmenorrhea     2000  . Fibroid   . Anxiety   . Asthma     exercise induced-no inhalers  . Sleep apnea     has a cpap-cannot use-makes her sick  . Hyperlipidemia    Past Surgical History  Procedure Laterality Date  . Reconstruction of nose  1986  . Shoulder surgery  1994    rt  . Abdominal hysterectomy  2004    ovaries retained. 2o fibroids  . Appendectomy  1980  . Breast surgery  1997    cyst left breast rem. benign  . Colonoscopy    . Knee arthroscopy Right 07/16/2013    Procedure: RIGHT ARTHROSCOPY KNEE, partial lateral menisectomy and chrondromalasia, with microfracture of lateral femeral chondyl, and excision of plica ;  Surgeon: Kerin Salen, MD;  Location: [place];  Service: Orthopedics;  Laterality: Right;  . Steriod injection Left 08/02/2014    Procedure: LEFT THUMB INJECTION;  Surgeon: Hessie Dibble, MD;  Location: [place];  Service: Orthopedics;  Laterality: Left;  . Shoulder arthroscopy with rotator cuff repair and subacromial decompression Left 08/02/2014    Procedure: SHOULDER ARTHROSCOPY WITH ROTATOR CUFF REPAIR AND SUBACROMIAL DECOMPRESSION;  Surgeon: Hessie Dibble, MD;  Location: [place];  Service: Orthopedics;  Laterality: Left;   Family History  Problem Relation Age of Onset   . Diabetes Mother   . Hyperlipidemia Mother   . Hypertension Mother   . Ulcers Mother   . Fibromyalgia Mother   . Sudden death Maternal Grandfather   . Heart attack Neg Hx   . Cancer Maternal Grandmother   . Cancer Paternal Grandmother     breast/liver   Social History  Substance Use Topics  . Smoking status: Never Smoker   . Smokeless tobacco: Never Used  . Alcohol Use: 3.6 oz/week    2 Glasses of wine, 3 Cans of beer, 1 Shots of liquor per week     Comment: 4 drinks a week   OB History    Gravida Para Term Preterm AB TAB SAB Ectopic Multiple Living   0              Review of Systems No sore throat + cough No pleuritic pain No wheezing + nasal congestion No post-nasal drainage No sinus pain/pressure No itchy/red eyes No earache No hemoptysis No SOB No fever/chills No nausea No vomiting No abdominal pain No diarrhea No urinary symptoms No skin rash No fatigue No myalgias No headache Used OTC meds without relief  Allergies  Hydrocodone  Home Medications   Prior to Admission medications   Medication Sig Start Date End Date Taking? Authorizing Provider  clarithromycin (BIAXIN) 500 MG  tablet Take 1 twice a day for 10 days. 10/31/15   Jacqulyn Cane, MD  fluticasone (FLOVENT HFA) 44 MCG/ACT inhaler Inhale 2 puffs into the lungs 2 (two) times daily. 11/11/15   Kandra Nicolas, MD  promethazine-codeine (PHENERGAN WITH CODEINE) 6.25-10 MG/5ML syrup Take 1-2 teaspoons every 4-6 hours as needed for cough. May cause drowsiness. 10/31/15   Jacqulyn Cane, MD   Meds Ordered and Administered this Visit  Medications - No data to display  BP 121/81 mmHg  Pulse 69  Temp(Src) 98.4 F (36.9 C) (Oral)  Wt 221 lb (100.245 kg)  SpO2 99%  LMP 08/20/2002 No data found.   Physical Exam Nursing notes and Vital Signs reviewed. Appearance:  Patient appears stated age, and in no acute distress Eyes:  Pupils are equal, round, and reactive to light and accomodation.  Extraocular  movement is intact.  Conjunctivae are not inflamed  Ears:  Canals normal.  Tympanic membranes normal.  Nose:  Mildly congested turbinates.  No sinus tenderness.   Pharynx:  Normal Neck:  Supple.  Tender enlarged posterior/lateral nodes are palpated bilaterally  Lungs:  Clear to auscultation.  Breath sounds are equal.  Moving air well. Heart:  Regular rate and rhythm without murmurs, rubs, or gallops.  Abdomen:  Nontender without masses or hepatosplenomegaly.  Bowel sounds are present.  No CVA or flank tenderness.  Extremities:  No edema.  Skin:  No rash present.   ED Course  Procedures  None   Imaging Review Dg Chest 2 View  11/11/2015  CLINICAL DATA:  Cough 1 month.  Recent diagnosis of bronchitis. EXAM: CHEST  2 VIEW COMPARISON:  06/07/2011 FINDINGS: Lungs are adequately inflated without focal consolidation or effusion. Cardiomediastinal silhouette is within normal. There is mild degenerate change of the spine. IMPRESSION: No active cardiopulmonary disease. Electronically Signed   By: Marin Olp M.D.   On: 11/11/2015 16:50      MDM   1. Cough, persistent; suspect post-infectious cough   Begin Flovent. May add Pseudoephedrine (30mg , one or two every 4 to 6 hours) for sinus congestion.    May use Afrin nasal spray (or generic oxymetazoline) twice daily for about 5 days and then discontinue (may use Afrin nose spray prior to SCUBA diving).  Also recommend using saline nasal spray several times daily and saline nasal irrigation (AYR is a common brand).  Use Flonase nasal spray each morning after using Afrin nasal spray and saline nasal irrigation. Followup with Family Doctor if not improved in 2 weeks.    Kandra Nicolas, MD 11/18/15 [phone]

## 2015-11-11 NOTE — ED Notes (Signed)
Pt was seen 10-31-15 and given abx and steroids for cough and congestion. States she completed all meds and had some improvement but now chest congestion and cough are worse. Denies fever.

## 2015-11-26 ENCOUNTER — Other Ambulatory Visit: Payer: Self-pay | Admitting: Bariatrics

## 2015-11-26 DIAGNOSIS — Z1231 Encounter for screening mammogram for malignant neoplasm of breast: Secondary | ICD-10-CM

## 2015-12-08 ENCOUNTER — Ambulatory Visit
Admission: RE | Admit: 2015-12-08 | Discharge: 2015-12-08 | Disposition: A | Discharge status: Home / Self Care | Payer: 59 | Source: Ambulatory Visit | Attending: Bariatrics | Admitting: Bariatrics

## 2015-12-08 DIAGNOSIS — Z1231 Encounter for screening mammogram for malignant neoplasm of breast: Secondary | ICD-10-CM

## 2015-12-24 ENCOUNTER — Encounter: Payer: Self-pay | Admitting: Nurse Practitioner

## 2015-12-24 ENCOUNTER — Ambulatory Visit (INDEPENDENT_AMBULATORY_CARE_PROVIDER_SITE_OTHER): Payer: 59 | Admitting: Nurse Practitioner

## 2015-12-24 VITALS — BP 126/86 | HR 62 | Resp 18 | Ht 63.5 in | Wt 237.0 lb

## 2015-12-24 DIAGNOSIS — N842 Polyp of vagina: Secondary | ICD-10-CM | POA: Diagnosis not present

## 2015-12-24 DIAGNOSIS — Z01419 Encounter for gynecological examination (general) (routine) without abnormal findings: Secondary | ICD-10-CM | POA: Diagnosis not present

## 2015-12-24 DIAGNOSIS — R55 Syncope and collapse: Secondary | ICD-10-CM | POA: Diagnosis not present

## 2015-12-24 DIAGNOSIS — E785 Hyperlipidemia, unspecified: Secondary | ICD-10-CM | POA: Diagnosis not present

## 2015-12-24 DIAGNOSIS — Z0000 Encounter for general adult medical examination without abnormal findings: Secondary | ICD-10-CM | POA: Diagnosis not present

## 2015-12-24 LAB — POCT URINALYSIS DIPSTICK
BILIRUBIN UA: NEGATIVE
Glucose, UA: NEGATIVE
Ketones, UA: NEGATIVE
Leukocytes, UA: NEGATIVE
Nitrite, UA: NEGATIVE
Protein, UA: NEGATIVE
RBC UA: NEGATIVE
Urobilinogen, UA: NEGATIVE
pH, UA: 5

## 2015-12-24 LAB — COMPREHENSIVE METABOLIC PANEL
ALK PHOS: 81 U/L (ref 33–130)
ALT: 39 U/L — AB (ref 6–29)
AST: 24 U/L (ref 10–35)
Albumin: 4.5 g/dL (ref 3.6–5.1)
BILIRUBIN TOTAL: 0.5 mg/dL (ref 0.2–1.2)
BUN: 19 mg/dL (ref 7–25)
CALCIUM: 9.8 mg/dL (ref 8.6–10.4)
CO2: 28 mmol/L (ref 20–31)
Chloride: 101 mmol/L (ref 98–110)
Creat: 1.01 mg/dL (ref 0.50–1.05)
Glucose, Bld: 82 mg/dL (ref 65–99)
Potassium: 4.3 mmol/L (ref 3.5–5.3)
Sodium: 139 mmol/L (ref 135–146)
Total Protein: 6.9 g/dL (ref 6.1–8.1)

## 2015-12-24 LAB — LIPID PANEL
Cholesterol: 347 mg/dL — ABNORMAL HIGH (ref 125–200)
HDL: 80 mg/dL (ref >=46)
LDL CALC: 227 mg/dL — AB (ref <130)
Total CHOL/HDL Ratio: 4.3 Ratio (ref <=5.0)
Triglycerides: 201 mg/dL — ABNORMAL HIGH (ref <150)
VLDL: 40 mg/dL — ABNORMAL HIGH (ref <30)

## 2015-12-24 LAB — TSH: TSH: 1.78 mIU/L

## 2015-12-24 LAB — CBC
HCT: 42.1 % (ref 35.0–45.0)
Hemoglobin: 14.3 g/dL (ref 11.7–15.5)
MCH: 33.1 pg — AB (ref 27.0–33.0)
MCHC: 34 g/dL (ref 32.0–36.0)
MCV: 97.5 fL (ref 80.0–100.0)
MPV: 9.8 fL (ref 7.5–12.5)
PLATELETS: 362 10*3/uL (ref 140–400)
RBC: 4.32 MIL/uL (ref 3.80–5.10)
RDW: 13.2 % (ref 11.0–15.0)
WBC: 10.1 10*3/uL (ref 3.8–10.8)

## 2015-12-24 LAB — HEMOGLOBIN A1C
Hgb A1c MFr Bld: 5.4 % (ref <5.7)
Mean Plasma Glucose: 108 mg/dL

## 2015-12-24 NOTE — Progress Notes (Signed)
55 y.o. G0P0 Single  Caucasian Fe here for annual exam. Had hysterectomy at age 55.  Now having significant vaso symptoms day and night.  Having insomnia,  No mood changes, no vaginal dryness. On low dose prednisone current for 2 weeks for right knee pain.  But the vaso symptoms are longer than that.  She then tells me that she has significant chills following a hot flash.  She is very worried that something besides menopause is going on.  She does cross fit and remains active even though she is protecting the right knee.  Cannot seem to loose any weight.She does not want HRT ever.  Same partner for 10 yrs.  Both have been married before and he has children.    Patient's last menstrual period was 08/20/2002.          Sexually active: Yes.    The current method of family planning is status post hysterectomy.    Exercising: Yes.    weights, cardio, swimming Smoker:  no  Health Maintenance: Pap:  12/30/09 Neg MMG:  12/11/15 BIRADS1:neg Colonoscopy: 07/2015 Polyps benign - f/u 5 years  TDaP:  12/2012 Hep C and HIV: No Labs: Here UA: negative   reports that she has never smoked. She has never used smokeless tobacco. She reports that she drinks about 1.8 oz of alcohol per week. She reports that she does not use illicit drugs.  Past Medical History  Diagnosis Date  . GERD (gastroesophageal reflux disease)   . Genital warts 1994    Tx  x2  . Fibroid   . Asthma     exercise induced-no inhalers  . Sleep apnea     has a cpap-cannot use-makes her sick  . Hyperlipidemia   . Abnormal uterine bleeding     due to fibroids  . Dysmenorrhea     2000  from fibroids    Past Surgical History  Procedure Laterality Date  . Reconstruction of nose  1986  . Shoulder surgery  1994    rt  . Appendectomy  1980  . Breast surgery  1997    cyst left breast rem. benign  . Colonoscopy  06/2015    polyp recheck 5 yrs  . Knee arthroscopy Right 07/16/2013    Procedure: RIGHT ARTHROSCOPY KNEE, partial lateral  menisectomy and chrondromalasia, with microfracture of lateral femeral chondyl, and excision of plica ;  Surgeon: Kerin Salen, MD;  Location: [facility];  Service: Orthopedics;  Laterality: Right;  . Steriod injection Left 08/02/2014    Procedure: LEFT THUMB INJECTION;  Surgeon: Hessie Dibble, MD;  Location: [facility];  Service: Orthopedics;  Laterality: Left;  . Shoulder arthroscopy with rotator cuff repair and subacromial decompression Left 08/02/2014    Procedure: SHOULDER ARTHROSCOPY WITH ROTATOR CUFF REPAIR AND SUBACROMIAL DECOMPRESSION;  Surgeon: Hessie Dibble, MD;  Location: [place];  Service: Orthopedics;  Laterality: Left;  . Abdominal hysterectomy  09/11/2002    ovaries retained. 2o fibroids    Current Outpatient Prescriptions  Medication Sig Dispense Refill  . cholecalciferol (VITAMIN D) 1000 units tablet Take 1,000 Units by mouth daily.    . Lactobacillus (PROBIOTIC ACIDOPHILUS) CAPS Take by mouth daily.    . Multiple Vitamin (THERA) TABS Take 1 tablet by mouth daily.    Marland Kitchen omeprazole-sodium bicarbonate (ZEGERID) 40-1100 MG capsule Take 1 capsule by mouth daily as needed.    . polyethylene glycol (MIRALAX / GLYCOLAX) packet Take by mouth as needed.    Marland Kitchen  predniSONE (DELTASONE) 5 MG tablet Take 5 mg by mouth daily with breakfast.     No current facility-administered medications for this visit.    Family History  Problem Relation Age of Onset  . Diabetes Mother   . Hyperlipidemia Mother   . Hypertension Mother   . Ulcers Mother   . Fibromyalgia Mother   . Sudden death Maternal Grandfather   . Heart attack Neg Hx   . Cancer Maternal Grandmother   . Cancer Paternal Grandmother     breast/liver  . Hyperlipidemia Father   . Seizures Father   . Hypertension Father   . Hyperlipidemia Brother     ROS:  Pertinent items are noted in HPI.  Otherwise, a comprehensive ROS was negative.  Exam:   BP 126/86 mmHg  Pulse 62   Resp 18  Ht 5' 3.5" (1.613 m)  Wt 237 lb (107.502 kg)  BMI 41.32 kg/m2  LMP 08/20/2002 Height: 5' 3.5" (161.3 cm) Ht Readings from Last 3 Encounters:  12/24/15 5' 3.5" (1.613 m)  12/10/14 5\' 4"  (1.626 m)  08/02/14 5\' 4"  (1.626 m)    General appearance: alert, cooperative and appears stated age Head: Normocephalic, without obvious abnormality, atraumatic Neck: no adenopathy, supple, symmetrical, trachea midline and thyroid normal to inspection and palpation Lungs: clear to auscultation bilaterally Breasts: normal appearance, no masses or tenderness, positive findings: fibrocystic changes Heart: regular rate and rhythm Abdomen: soft, non-tender; no masses,  no organomegaly Extremities: extremities normal, atraumatic, no cyanosis or edema Skin: Skin color, texture, turgor normal. No rashes or lesions Lymph nodes: Cervical, supraclavicular, and axillary nodes normal. No abnormal inguinal nodes palpated Neurologic: Grossly normal   Pelvic: External genitalia:  no lesions              Urethra:  normal appearing urethra with no masses, tenderness or lesions              Bartholin's and Skene's: normal                 Vagina: normal appearing vagina with normal color and discharge, no lesions              Cervix: absent but there is a significant polyp present about 1 cm length              Pap taken: Yes.   Bimanual Exam:  Uterus:  uterus absent              Adnexa: no mass, fullness, tenderness               Rectovaginal: Confirms               Anus:  normal sphincter tone, no lesions  Chaperone present: yes  A:  Well Woman with normal exam  S/P TAH secondary to fibroids 2004  history of GERD, hyperlipidemia  FCB with left breast cystectomy 1997  Significant vaso symptoms  Weight gain  [place]:  Hyperlipidemia, HTN  Most likely menopausal   P:   Reviewed health and wellness pertinent to exam  Pap smear as above  Mammogra is due 11/2016  Will get labs and follow - concerns  about metabolic syndrome with her  Will get polypectomy ordered.  Counseled on breast self exam, mammography screening, adequate intake of calcium and vitamin D, diet and exercise return annually or prn  An After Visit Summary was printed and given to the patient.

## 2015-12-24 NOTE — Patient Instructions (Signed)

## 2015-12-25 LAB — HEPATITIS C ANTIBODY: HCV AB: NEGATIVE

## 2015-12-25 LAB — HIV ANTIBODY (ROUTINE TESTING W REFLEX): HIV: NONREACTIVE

## 2015-12-25 LAB — VITAMIN D 25 HYDROXY (VIT D DEFICIENCY, FRACTURES): Vit D, 25-Hydroxy: 50 ng/mL (ref 30–100)

## 2015-12-25 LAB — ESTRADIOL

## 2015-12-25 LAB — FSH/LH
FSH: 50.6 m[IU]/mL
LH: 29.3 m[IU]/mL

## 2015-12-28 NOTE — Progress Notes (Signed)
Encounter reviewed by Dr. Brook Amundson C. Silva.  

## 2015-12-29 LAB — IPS PAP TEST WITH HPV

## 2015-12-31 ENCOUNTER — Telehealth: Payer: Self-pay

## 2015-12-31 NOTE — Telephone Encounter (Signed)
-----   Message from Kem Boroughs, [place] sent at 12/29/2015  8:32 AM EDT ----- Please let pt know that Hep C and HIV is negative as expected.  Vit D is great at 50.  FSH, LH, and estradiol level are in the menopausal range.  She was going to look into herbal remedies as she does not want HRT.  The HGB AIC is normal.  Lipid panel was very elevated and needs to be followed by PCP. The CMP was normal except 1 elevated liver test.  This also needs to be followed by PCP.  TSH and CBC is normal.  I have reviewed these with Dr. Quincy Simmonds and her recommendations are for pt to follow with PCP about her weight problems and malaise.  Does she need help and arranging apt and does she need a copy of labs. (see note from Dr. Quincy Simmonds).  We still need to proceed with vaginal wall polyp that needs removal.  She is not yet on the schedule.

## 2015-12-31 NOTE — Telephone Encounter (Signed)
Spoke with patient. Advised of message as results as seen below from Kem Boroughs, [place]. She is agreeable and verbalizes understanding. She would like a copy of her labs faxed to Jearld Lesch, DO. Verbal request for release of medical records completed and given to the front desk to fax lab results from 12/24/2015. Patient declines assistance with scheduling her PCP appointment. States she would like to schedule this appointment. She would like to proceed with scheduling polyp removal in the office with Dr.Silva. Appointment scheduled for 01/07/2016 at 3 pm with Dr.Silva. She is agreeable to date and time. Benefits for OOP cost discussed with patient. She is agreeable.  Cc: Dr.Silva Lerry Liner  Routing to provider for final review. Patient agreeable to disposition. Will close encounter.

## 2016-01-01 ENCOUNTER — Telehealth: Payer: Self-pay | Admitting: Obstetrics and Gynecology

## 2016-01-01 NOTE — Telephone Encounter (Signed)
Spoke with patient. Patient needs to reschedule her appointment for cervical polyp removal. Appointment rescheduled to 01/09/2016 at 3 pm with Dr.Silva. She is agreeable to date and time.  Routing to provider for final review. Patient agreeable to disposition. Will close encounter.

## 2016-01-01 NOTE — Telephone Encounter (Signed)
Patient is unable to come to biopsy appointment on 01/07/16.  Needs to reschedule.

## 2016-01-06 ENCOUNTER — Telehealth: Payer: Self-pay

## 2016-01-06 NOTE — Telephone Encounter (Signed)
Spoke with patient at time of incoming call. Patient reports she had a family emergency and has to go out of town. She needs to reschedule her cervical polyp removal at this time. Appointment rescheduled to 01/15/2016 at 3 pm with Dr.Silva. She is agreeable to date and time.  Routing to provider for final review. Patient agreeable to disposition. Will close encounter.

## 2016-01-07 ENCOUNTER — Ambulatory Visit: Payer: 59 | Admitting: Obstetrics and Gynecology

## 2016-01-09 ENCOUNTER — Ambulatory Visit: Payer: 59 | Admitting: Obstetrics and Gynecology

## 2016-01-15 ENCOUNTER — Ambulatory Visit (INDEPENDENT_AMBULATORY_CARE_PROVIDER_SITE_OTHER): Payer: 59 | Admitting: Obstetrics and Gynecology

## 2016-01-15 ENCOUNTER — Encounter: Payer: Self-pay | Admitting: Obstetrics and Gynecology

## 2016-01-15 DIAGNOSIS — N842 Polyp of vagina: Secondary | ICD-10-CM

## 2016-01-15 NOTE — Patient Instructions (Signed)
We will call to schedule your vaginal polyp removal in the hospital setting.

## 2016-01-15 NOTE — Progress Notes (Signed)
GYNECOLOGY  VISIT   HPI: 55 y.o.   Single  Caucasian  female   G0P0 with Marissa [name]'s last menstrual period was 08/20/2002.   here for vaginal Polyp removal.  Status post abdominal hysterectomy for fibroids age 21.  Hx genital warts.  GYNECOLOGIC HISTORY: Marissa [name]'s last menstrual period was 08/20/2002. Contraception:  hysterectomy Menopausal hormone therapy:  none Last mammogram:  12/08/15 BI-RADS1 negative Last pap smear:   12/24/15 pap wnl; HR HPV not detected         OB History    Gravida Para Term Preterm AB Living   0             SAB TAB Ectopic Multiple Live Births                     Marissa [name] Active Problem List   Diagnosis Date Noted  . Cough 08/16/2011  . Right hand pain 01/18/2011  . OBSTRUCTIVE SLEEP APNEA 07/10/2010  . Intrinsic asthma, unspecified 06/25/2010  . DYSPNEA ON EXERTION 05/14/2010  . HYPERLIPIDEMIA 07/14/2007  . DEPRESSION 07/14/2007  . GERD 07/14/2007  . HIATAL HERNIA 04/06/2007    Past Medical History:  Diagnosis Date  . Abnormal uterine bleeding    due to fibroids  . Asthma    exercise induced-no inhalers  . Dysmenorrhea    2000  from fibroids  . Fibroid   . Genital warts 1994   Tx  x2  . GERD (gastroesophageal reflux disease)   . Hyperlipidemia   . Sleep apnea    has a cpap-cannot use-makes her sick    Past Surgical History:  Procedure Laterality Date  . ABDOMINAL HYSTERECTOMY  09/11/2002   ovaries retained. 2o fibroids  . APPENDECTOMY  1980  . BREAST SURGERY  1997   cyst left breast rem. benign  . COLONOSCOPY  06/2015   polyp recheck 5 yrs  . KNEE ARTHROSCOPY Right 07/16/2013   Procedure: RIGHT ARTHROSCOPY KNEE, partial lateral menisectomy and chrondromalasia, with microfracture of lateral femeral chondyl, and excision of plica ;  Surgeon: Kerin Salen, MD;  Location: [facility];  Service: Orthopedics;  Laterality: Right;  . RECONSTRUCTION OF NOSE  1986  . SHOULDER ARTHROSCOPY WITH ROTATOR CUFF REPAIR AND  SUBACROMIAL DECOMPRESSION Left 08/02/2014   Procedure: SHOULDER ARTHROSCOPY WITH ROTATOR CUFF REPAIR AND SUBACROMIAL DECOMPRESSION;  Surgeon: Hessie Dibble, MD;  Location: [place] [place];  Service: Orthopedics;  Laterality: Left;  . [place]   rt  . STERIOD INJECTION Left 08/02/2014   Procedure: LEFT THUMB INJECTION;  Surgeon: Hessie Dibble, MD;  Location: [place];  Service: Orthopedics;  Laterality: Left;    Current Outpatient Prescriptions  Medication Sig Dispense Refill  . Lactobacillus (PROBIOTIC ACIDOPHILUS) CAPS Take by mouth daily.    . Multiple Vitamin (THERA) TABS Take 1 tablet by mouth daily.    Marland Kitchen omeprazole-sodium bicarbonate (ZEGERID) 40-1100 MG capsule Take 1 capsule by mouth daily as needed.    . polyethylene glycol (MIRALAX / GLYCOLAX) packet Take by mouth as needed.    . cholecalciferol (VITAMIN D) 1000 units tablet Take 1,000 Units by mouth daily.     No current facility-administered medications for this visit.      ALLERGIES: Statins and Hydrocodone  Family History  Problem Relation Age of Onset  . Diabetes Mother   . Hyperlipidemia Mother   . Hypertension Mother   . Ulcers Mother   . Fibromyalgia Mother   . Sudden death  Maternal Grandfather   . Heart attack Neg Hx   . Cancer Maternal Grandmother   . Cancer Paternal Grandmother     breast/liver  . Hyperlipidemia Father   . Seizures Father   . Hypertension Father   . Hyperlipidemia Brother     Social History   Social History  . Marital status: Single    Spouse name: N/A  . Number of children: N/A  . Years of education: N/A   Occupational History  . Not on file.   Social History Main Topics  . Smoking status: Never Smoker  . Smokeless tobacco: Never Used  . Alcohol use 1.8 oz/week    1 Glasses of wine, 1 Cans of beer, 1 Shots of liquor per week  . Drug use: No  . Sexual activity: Yes    Partners: Male    Birth control/ protection: Surgical      Comment: Hysterectomy   Other Topics Concern  . Not on file   Social History Narrative  . No narrative on file    ROS:  Pertinent items are noted in HPI.  PHYSICAL EXAMINATION:    BP 112/60 (BP Location: Right Arm, Marissa [name] Position: Sitting, Cuff Size: Large)   Pulse 76   Resp 16   Wt 244 lb (110.7 kg)   LMP 08/20/2002   BMI 42.54 kg/m     General appearance: alert, cooperative and appears stated age Head: Normocephalic, without obvious abnormality, atraumatic Neck: no adenopathy, supple, symmetrical, trachea midline and thyroid normal to inspection and palpation Lungs: clear to auscultation bilaterally Heart: regular rate and rhythm Abdomen: soft, non-tender, no masses,  no organomegaly Extremities: extremities normal, atraumatic, no cyanosis or edema Skin: Skin color, texture, turgor normal. No rashes or lesions No abnormal inguinal nodes palpated Neurologic: Grossly normal  Pelvic: External genitalia:  no lesions              Urethra:  normal appearing urethra with no masses, tenderness or lesions              Bartholins and Skenes: normal                 Vagina: large 1 - 2.5 cm polyp at left vaginal apex.  Broad base to it. Marissa [name] with discomfort with grasping the polyp with ring forceps.                Cervix: absent            Bimanual Exam:  Uterus:  uterus absent              Adnexa: no mass, fullness, tenderness          Chaperone was present for exam.  ASSESSMENT  Large vaginal apical polyp.  Status post hysterectomy.  Normal recent pap.   PLAN  Proceed with removal of large polyp in OR setting.  This will require cautery and possible suturing of the vaginal apex.  Risks, benefits, and alternatives discussed with the Marissa [name].  Risks include bleeding, infection, damage to surrounding organs, reaction to anesthesia, DV, PE, and death.  Surgery and recovery expectations discussed. Marissa [name] wishes to proceed.   An After Visit Summary was printed and  given to the Marissa [name].  __15____ minutes face to face time of which over 50% was spent in counseling.

## 2016-01-26 ENCOUNTER — Telehealth: Payer: Self-pay | Admitting: Obstetrics and Gynecology

## 2016-01-26 NOTE — Telephone Encounter (Signed)
Patient's calling to schedule surgery.

## 2016-01-26 NOTE — Telephone Encounter (Signed)
Return call to patient. Left message to call back regarding scheduling.

## 2016-01-27 NOTE — Telephone Encounter (Signed)
Return call from patient. Advised of instructions from Dr Quincy Simmonds. Date options for September discussed. Advised will schedule and call patient back with confirmation.

## 2016-01-27 NOTE — Telephone Encounter (Signed)
She may need some sutures, so it is wise to not submerge in water for 2 weeks post op.

## 2016-01-27 NOTE — Telephone Encounter (Signed)
Call to patient, left message to call back. 

## 2016-01-27 NOTE — Telephone Encounter (Signed)
Patient returning call regarding surgery scheduling. Patient [place]  [place] to know [place] surgery [place] surgery.  Advised will check with Dr Quincy Simmonds regarding post op recovery and call her back. May need to wait until after labor Day plans to schedule procedure.

## 2016-01-29 NOTE — Telephone Encounter (Signed)
Call to patient to review surgery date and instructions. Left message to call back.  

## 2016-02-02 NOTE — Telephone Encounter (Signed)
Return call to patient. Confirmed surgery date of 03-02-16 at 1100, arrive at 43 unless hospital directs otherwise. Surgery instruction sheet reviewed and printed copy mailed to patient. See copy scanned to chart.  Routing to provider for final review. Patient agreeable to disposition. Will close encounter.

## 2016-02-02 NOTE — Telephone Encounter (Signed)
Patient returning call to Sally. °

## 2016-02-24 NOTE — Patient Instructions (Signed)
Your procedure is scheduled on:  Tuesday, Sept. 12, 2017  Enter through the [employer] [facility] at:  9:30 AM  Pick up the phone at the desk and dial [phone].  Call this number if you have problems the morning of surgery: [phone].  Remember: Do NOT eat food or drink after:  Midnight Monday, Sept. 11, 2017  Take these medicines the morning of surgery with a SIP OF WATER:  Omeprazole  Do NOT wear jewelry (body piercing), metal hair clips/bobby pins, make-up, or nail polish. Do NOT wear lotions, powders, or perfumes.  You may wear deodorant. Do NOT shave for 48 hours prior to surgery. Do NOT bring valuables to the hospital. Contacts, dentures, or bridgework may not be worn into surgery.  Have a responsible adult drive you home and stay with you for 24 hours after your procedure

## 2016-02-25 ENCOUNTER — Encounter (HOSPITAL_COMMUNITY): Payer: Self-pay

## 2016-02-25 ENCOUNTER — Encounter (HOSPITAL_COMMUNITY)
Admission: RE | Admit: 2016-02-25 | Discharge: 2016-02-25 | Disposition: A | Discharge status: Home / Self Care | Payer: 59 | Source: Ambulatory Visit | Attending: Obstetrics and Gynecology | Admitting: Obstetrics and Gynecology

## 2016-02-25 ENCOUNTER — Ambulatory Visit (INDEPENDENT_AMBULATORY_CARE_PROVIDER_SITE_OTHER): Payer: 59 | Admitting: Obstetrics and Gynecology

## 2016-02-25 VITALS — BP 128/80 | HR 64 | Resp 12 | Ht 64.0 in | Wt 253.6 lb

## 2016-02-25 DIAGNOSIS — N842 Polyp of vagina: Secondary | ICD-10-CM

## 2016-02-25 DIAGNOSIS — Z01818 Encounter for other preprocedural examination: Secondary | ICD-10-CM | POA: Diagnosis present

## 2016-02-25 HISTORY — DX: Unspecified osteoarthritis, unspecified site: M19.90

## 2016-02-25 LAB — CBC
HEMATOCRIT: 38.5 % (ref 36.0–46.0)
Hemoglobin: 13.5 g/dL (ref 12.0–15.0)
MCH: 33 pg (ref 26.0–34.0)
MCHC: 35.1 g/dL (ref 30.0–36.0)
MCV: 94.1 fL (ref 78.0–100.0)
Platelets: 289 10*3/uL (ref 150–400)
RBC: 4.09 MIL/uL (ref 3.87–5.11)
RDW: 12.2 % (ref 11.5–15.5)
WBC: 9.3 10*3/uL (ref 4.0–10.5)

## 2016-02-25 NOTE — Progress Notes (Signed)
GYNECOLOGY  VISIT   HPI: 55 y.o.   Single  Caucasian  female   G0P0 with Patient's last menstrual period was 08/20/2002.   here for surgery cosult for Excision of Vaginal Cyst.  Cyst was noted on routine pelvic exam.  No vaginal bleeding.   Status post TAH for fibroids.   Having a knee replacement in November.   Retired Engineer, structural.   GYNECOLOGIC HISTORY: Patient's last menstrual period was 08/20/2002. Contraception:  Hysterectomy Menopausal hormone therapy:  none Last mammogram:  11/28/15 BIRADS 1, [facility] Last pap smear:   12/24/15 WNL neg HR HPV.       OB History    Gravida Para Term Preterm AB Living   0             SAB TAB Ectopic Multiple Live Births                     Patient Active Problem List   Diagnosis Date Noted  . Cough 08/16/2011  . Right hand pain 01/18/2011  . OBSTRUCTIVE SLEEP APNEA 07/10/2010  . Intrinsic asthma, unspecified 06/25/2010  . DYSPNEA ON EXERTION 05/14/2010  . HYPERLIPIDEMIA 07/14/2007  . DEPRESSION 07/14/2007  . GERD 07/14/2007  . HIATAL HERNIA 04/06/2007    Past Medical History:  Diagnosis Date  . Abnormal uterine bleeding    due to fibroids  . Arthritis    knee, scheduled for knee replacement 04/2016  . Asthma    exercise induced-no inhalers  . Dysmenorrhea    2000  from fibroids  . Fibroid   . Genital warts 1994   Tx  x2  . GERD (gastroesophageal reflux disease)   . Hyperlipidemia   . Sleep apnea    has a cpap-cannot use-makes her sick    Past Surgical History:  Procedure Laterality Date  . ABDOMINAL HYSTERECTOMY  09/11/2002   ovaries retained. 2o fibroids  . APPENDECTOMY  1980  . BREAST SURGERY  1997   cyst left breast rem. benign  . COLONOSCOPY  06/2015   polyp recheck 5 yrs  . KNEE ARTHROSCOPY Right 07/16/2013   Procedure: RIGHT ARTHROSCOPY KNEE, partial lateral menisectomy and chrondromalasia, with microfracture of lateral femeral chondyl, and excision of plica ;  Surgeon: Kerin Salen, MD;   Location: [place];  Service: Orthopedics;  Laterality: Right;  . RECONSTRUCTION OF NOSE  1986  . SHOULDER ARTHROSCOPY WITH ROTATOR CUFF REPAIR AND SUBACROMIAL DECOMPRESSION Left 08/02/2014   Procedure: SHOULDER ARTHROSCOPY WITH ROTATOR CUFF REPAIR AND SUBACROMIAL DECOMPRESSION;  Surgeon: Hessie Dibble, MD;  Location: [facility];  Service: Orthopedics;  Laterality: Left;  . [place]   rt  . STERIOD INJECTION Left 08/02/2014   Procedure: LEFT THUMB INJECTION;  Surgeon: Hessie Dibble, MD;  Location: [facility];  Service: Orthopedics;  Laterality: Left;    Current Outpatient Prescriptions  Medication Sig Dispense Refill  . Lactobacillus (PROBIOTIC ACIDOPHILUS) CAPS Take 1 capsule by mouth daily.     . Multiple Vitamin (THERA) TABS Take 1 tablet by mouth daily.    Marland Kitchen omeprazole-sodium bicarbonate (ZEGERID) 40-1100 MG capsule Take 1 capsule by mouth daily as needed (acid reflux).     Marland Kitchen OVER THE COUNTER MEDICATION Take 1 capsule by mouth 2 (two) times daily. SF dietary supplement for weight management    . OVER THE COUNTER MEDICATION Take 1 capsule by mouth daily. Curamed and din complex, estrogen balance    .  OVER THE COUNTER MEDICATION Take 1 capsule by mouth 3 (three) times daily. Fat Grabbers supplement    . polyethylene glycol (MIRALAX / GLYCOLAX) packet Take 17 g by mouth as needed for mild constipation or moderate constipation.      No current facility-administered medications for this visit.      ALLERGIES: Statins and Hydrocodone  Family History  Problem Relation Age of Onset  . Diabetes Mother   . Hyperlipidemia Mother   . Hypertension Mother   . Ulcers Mother   . Fibromyalgia Mother   . Sudden death Maternal Grandfather   . Cancer Maternal Grandmother   . Cancer Paternal Grandmother     breast/liver  . Hyperlipidemia Father   . Seizures Father   . Hypertension Father   . Hyperlipidemia Brother   . Heart  attack Neg Hx     Social History   Social History  . Marital status: Single    Spouse name: N/A  . Number of children: N/A  . Years of education: N/A   Occupational History  . Not on file.   Social History Main Topics  . Smoking status: Never Smoker  . Smokeless tobacco: Never Used  . Alcohol use 1.8 oz/week    1 Glasses of wine, 1 Cans of beer, 1 Shots of liquor per week  . Drug use: No  . Sexual activity: Yes    Partners: Male    Birth control/ protection: Surgical     Comment: Hysterectomy   Other Topics Concern  . Not on file   Social History Narrative  . No narrative on file    ROS:  Pertinent items are noted in HPI.  PHYSICAL EXAMINATION:    BP 128/80 (BP Location: Right Arm, Patient Position: Sitting, Cuff Size: Large)   Pulse 64   Resp 12   Ht 5\' 4"  (1.626 m)   Wt 253 lb 9.6 oz (115 kg)   LMP 08/20/2002   BMI 43.53 kg/m     General appearance: alert, cooperative and appears stated age Head: Normocephalic, without obvious abnormality, atraumatic Neck: no adenopathy, supple, symmetrical, trachea midline and thyroid normal to inspection and palpation Lungs: clear to auscultation bilaterally Heart: regular rate and rhythm Abdomen: soft, non-tender, no masses,  no organomegaly Extremities: extremities normal, atraumatic, no cyanosis or edema Skin: Skin color, texture, turgor normal. No rashes or lesions Lymph nodes: Cervical, supraclavicular, and axillary nodes normal. No abnormal inguinal nodes palpated Neurologic: Grossly normal  Pelvic: External genitalia:  no lesions              Urethra:  normal appearing urethra with no masses, tenderness or lesions              Bartholins and Skenes: normal                 Vagina:  Large 2 x 1 cm left vaginal apical polyp.              Cervix:  absent                Bimanual Exam:  Uterus:  absent              Adnexa: no mass, fullness, tenderness             Chaperone was present for  exam.  ASSESSMENT  Status post TAH for fibroids.  Ovaries remain.  Large vaginal apical polyp.   Normal recent pap and negative HR HPV.  PLAN  Proceed with  vaginal excision of polyp.  Will likely use a LEEP technique with possible sharp excision and suturing. Risks, benefits, and alternatives reviewed with the patient who wishes to proceed.  Needs 2 days off from work mostly for recovery of anesthesia.   An After Visit Summary was printed and given to the patient.  __15____ minutes face to face time of which over 50% was spent in counseling.

## 2016-02-26 DIAGNOSIS — Z8489 Family history of other specified conditions: Secondary | ICD-10-CM

## 2016-02-26 HISTORY — DX: Family history of other specified conditions: Z84.89

## 2016-02-28 ENCOUNTER — Encounter: Payer: Self-pay | Admitting: Obstetrics and Gynecology

## 2016-03-01 MED ORDER — DEXTROSE 5 % IV SOLN
2.0000 g | INTRAVENOUS | Status: AC
  Administered 2016-03-02: 2 g via INTRAVENOUS
  Filled 2016-03-01: qty 2

## 2016-03-01 NOTE — H&P (Signed)
GYNECOLOGY  VISIT   HPI: 55 y.o.   Single  Caucasian  female   G0P0 with Patient's last menstrual period was 08/20/2002.   here for surgery cosult for Excision of Vaginal Cyst.  Cyst was noted on routine pelvic exam.  No vaginal bleeding.   Status post TAH for fibroids.   Having a knee replacement in November.   Retired Engineer, structural.   GYNECOLOGIC HISTORY: Patient's last menstrual period was 08/20/2002. Contraception:  Hysterectomy Menopausal hormone therapy:  none Last mammogram:  11/28/15 [facility] Last pap smear:   12/24/15 WNL neg HR HPV.               OB History    Gravida Para Term Preterm AB Living   0             SAB TAB Ectopic Multiple Live Births                         Patient Active Problem List   Diagnosis Date Noted  . Cough 08/16/2011  . Right hand pain 01/18/2011  . OBSTRUCTIVE SLEEP APNEA 07/10/2010  . Intrinsic asthma, unspecified 06/25/2010  . DYSPNEA ON EXERTION 05/14/2010  . HYPERLIPIDEMIA 07/14/2007  . DEPRESSION 07/14/2007  . GERD 07/14/2007  . HIATAL HERNIA 04/06/2007        Past Medical History:  Diagnosis Date  . Abnormal uterine bleeding    due to fibroids  . Arthritis    knee, scheduled for knee replacement 04/2016  . Asthma    exercise induced-no inhalers  . Dysmenorrhea    2000  from fibroids  . Fibroid   . Genital warts 1994   Tx  x2  . GERD (gastroesophageal reflux disease)   . Hyperlipidemia   . Sleep apnea    has a cpap-cannot use-makes her sick         Past Surgical History:  Procedure Laterality Date  . ABDOMINAL HYSTERECTOMY  09/11/2002   ovaries retained. 2o fibroids  . APPENDECTOMY  1980  . BREAST SURGERY  1997   cyst left breast rem. benign  . COLONOSCOPY  06/2015   polyp recheck 5 yrs  . KNEE ARTHROSCOPY Right 07/16/2013   Procedure: RIGHT ARTHROSCOPY KNEE, partial lateral menisectomy and chrondromalasia, with microfracture of lateral femeral  chondyl, and excision of plica ;  Surgeon: Kerin Salen, MD;  Location: [facility];  Service: Orthopedics;  Laterality: Right;  . RECONSTRUCTION OF NOSE  1986  . SHOULDER ARTHROSCOPY WITH ROTATOR CUFF REPAIR AND SUBACROMIAL DECOMPRESSION Left 08/02/2014   Procedure: SHOULDER ARTHROSCOPY WITH ROTATOR CUFF REPAIR AND SUBACROMIAL DECOMPRESSION;  Surgeon: Hessie Dibble, MD;  Location: [facility];  Service: Orthopedics;  Laterality: Left;  . [place]   rt  . STERIOD INJECTION Left 08/02/2014   Procedure: LEFT THUMB INJECTION;  Surgeon: Hessie Dibble, MD;  Location: [facility];  Service: Orthopedics;  Laterality: Left;          Current Outpatient Prescriptions  Medication Sig Dispense Refill  . Lactobacillus (PROBIOTIC ACIDOPHILUS) CAPS Take 1 capsule by mouth daily.     . Multiple Vitamin (THERA) TABS Take 1 tablet by mouth daily.    Marland Kitchen omeprazole-sodium bicarbonate (ZEGERID) 40-1100 MG capsule Take 1 capsule by mouth daily as needed (acid reflux).     Marland Kitchen OVER THE COUNTER MEDICATION Take 1 capsule by mouth 2 (two) times daily. SF dietary supplement  for weight management    . OVER THE COUNTER MEDICATION Take 1 capsule by mouth daily. Curamed and din complex, estrogen balance    . OVER THE COUNTER MEDICATION Take 1 capsule by mouth 3 (three) times daily. Fat Grabbers supplement    . polyethylene glycol (MIRALAX / GLYCOLAX) packet Take 17 g by mouth as needed for mild constipation or moderate constipation.      No current facility-administered medications for this visit.      ALLERGIES: Statins and Hydrocodone        Family History  Problem Relation Age of Onset  . Diabetes Mother   . Hyperlipidemia Mother   . Hypertension Mother   . Ulcers Mother   . Fibromyalgia Mother   . Sudden death Maternal Grandfather   . Cancer Maternal Grandmother   . Cancer Paternal Grandmother     breast/liver  .  Hyperlipidemia Father   . Seizures Father   . Hypertension Father   . Hyperlipidemia Brother   . Heart attack Neg Hx     Social History        Social History  . Marital status: Single    Spouse name: N/A  . Number of children: N/A  . Years of education: N/A      Occupational History  . Not on file.         Social History Main Topics  . Smoking status: Never Smoker  . Smokeless tobacco: Never Used  . Alcohol use 1.8 oz/week    1 Glasses of wine, 1 Cans of beer, 1 Shots of liquor per week  . Drug use: No  . Sexual activity: Yes    Partners: Male    Birth control/ protection: Surgical     Comment: Hysterectomy       Other Topics Concern  . Not on file      Social History Narrative  . No narrative on file    ROS:  Pertinent items are noted in HPI.  PHYSICAL EXAMINATION:    BP 128/80 (BP Location: Right Arm, Patient Position: Sitting, Cuff Size: Large)   Pulse 64   Resp 12   Ht 5\' 4"  (1.626 m)   Wt 253 lb 9.6 oz (115 kg)   LMP 08/20/2002   BMI 43.53 kg/m     General appearance: alert, cooperative and appears stated age Head: Normocephalic, without obvious abnormality, atraumatic Neck: no adenopathy, supple, symmetrical, trachea midline and thyroid normal to inspection and palpation Lungs: clear to auscultation bilaterally Heart: regular rate and rhythm Abdomen: soft, non-tender, no masses,  no organomegaly Extremities: extremities normal, atraumatic, no cyanosis or edema Skin: Skin color, texture, turgor normal. No rashes or lesions Lymph nodes: Cervical, supraclavicular, and axillary nodes normal. No abnormal inguinal nodes palpated Neurologic: Grossly normal  Pelvic: External genitalia:  no lesions              Urethra:  normal appearing urethra with no masses, tenderness or lesions              Bartholins and Skenes: normal                 Vagina:  Large 2 x 1 cm left vaginal apical polyp.              Cervix:  absent                 Bimanual Exam:  Uterus:  absent  Adnexa: no mass, fullness, tenderness             Chaperone was present for exam.  ASSESSMENT  Status post TAH for fibroids.  Ovaries remain.  Large vaginal apical polyp.   Normal recent pap and negative HR HPV.  PLAN  Proceed with vaginal excision of polyp.  Will likely use a LEEP technique with possible sharp excision and suturing. Risks, benefits, and alternatives reviewed with the patient who wishes to proceed.  Needs 2 days off from work mostly for recovery of anesthesia.   An After Visit Summary was printed and given to the patient.  __15____ minutes face to face time of which over 50% was spent in counseling.

## 2016-03-02 ENCOUNTER — Encounter (HOSPITAL_COMMUNITY): Payer: Self-pay | Admitting: Anesthesiology

## 2016-03-02 ENCOUNTER — Ambulatory Visit (HOSPITAL_COMMUNITY): Payer: 59 | Admitting: Anesthesiology

## 2016-03-02 ENCOUNTER — Ambulatory Visit (HOSPITAL_COMMUNITY)
Admission: RE | Admit: 2016-03-02 | Discharge: 2016-03-02 | Disposition: A | Discharge status: Home / Self Care | Payer: 59 | Source: Ambulatory Visit | Attending: Obstetrics and Gynecology | Admitting: Obstetrics and Gynecology

## 2016-03-02 ENCOUNTER — Encounter (HOSPITAL_COMMUNITY)
Admission: RE | Disposition: A | Discharge status: Home / Self Care | Payer: Self-pay | Source: Ambulatory Visit | Attending: Obstetrics and Gynecology

## 2016-03-02 DIAGNOSIS — N842 Polyp of vagina: Secondary | ICD-10-CM | POA: Insufficient documentation

## 2016-03-02 DIAGNOSIS — G4730 Sleep apnea, unspecified: Secondary | ICD-10-CM | POA: Insufficient documentation

## 2016-03-02 HISTORY — PX: EXCISION VAGINAL CYST: SHX5825

## 2016-03-02 HISTORY — DX: Family history of other specified conditions: Z84.89

## 2016-03-02 SURGERY — EXCISION, CYST, VAGINA
Anesthesia: General | Site: Vagina

## 2016-03-02 MED ORDER — IBUPROFEN 800 MG PO TABS: 800.0000 mg | ORAL_TABLET | Freq: Three times a day (TID) | ORAL | 0 refills | Status: DC | PRN

## 2016-03-02 MED ORDER — LIDOCAINE HCL (CARDIAC) 20 MG/ML IV SOLN
INTRAVENOUS | Status: DC | PRN
  Administered 2016-03-02: 80 mg via INTRAVENOUS

## 2016-03-02 MED ORDER — SCOPOLAMINE 1 MG/3DAYS TD PT72
1.0000 | MEDICATED_PATCH | Freq: Once | TRANSDERMAL | Status: DC
  Administered 2016-03-02: 1.5 mg via TRANSDERMAL

## 2016-03-02 MED ORDER — DEXAMETHASONE SODIUM PHOSPHATE 4 MG/ML IJ SOLN
INTRAMUSCULAR | Status: DC | PRN
  Administered 2016-03-02: 4 mg via INTRAVENOUS

## 2016-03-02 MED ORDER — PROPOFOL 10 MG/ML IV BOLUS
INTRAVENOUS | Status: AC
  Filled 2016-03-02: qty 20

## 2016-03-02 MED ORDER — MIDAZOLAM HCL 2 MG/2ML IJ SOLN
INTRAMUSCULAR | Status: AC
  Filled 2016-03-02: qty 2

## 2016-03-02 MED ORDER — SCOPOLAMINE 1 MG/3DAYS TD PT72
MEDICATED_PATCH | TRANSDERMAL | Status: AC
  Filled 2016-03-02: qty 1

## 2016-03-02 MED ORDER — FENTANYL CITRATE (PF) 100 MCG/2ML IJ SOLN: 25.0000 ug | INTRAMUSCULAR | Status: DC | PRN

## 2016-03-02 MED ORDER — MIDAZOLAM HCL 2 MG/2ML IJ SOLN
INTRAMUSCULAR | Status: DC | PRN
  Administered 2016-03-02 (×2): 1 mg via INTRAVENOUS

## 2016-03-02 MED ORDER — PROPOFOL 10 MG/ML IV BOLUS
INTRAVENOUS | Status: DC | PRN
  Administered 2016-03-02: 200 mg via INTRAVENOUS

## 2016-03-02 MED ORDER — ONDANSETRON HCL 4 MG/2ML IJ SOLN
INTRAMUSCULAR | Status: DC | PRN
  Administered 2016-03-02: 4 mg via INTRAVENOUS

## 2016-03-02 MED ORDER — ONDANSETRON HCL 4 MG/2ML IJ SOLN
INTRAMUSCULAR | Status: AC
  Filled 2016-03-02: qty 2

## 2016-03-02 MED ORDER — DEXAMETHASONE SODIUM PHOSPHATE 4 MG/ML IJ SOLN
INTRAMUSCULAR | Status: AC
  Filled 2016-03-02: qty 1

## 2016-03-02 MED ORDER — FENTANYL CITRATE (PF) 100 MCG/2ML IJ SOLN
INTRAMUSCULAR | Status: AC
  Filled 2016-03-02: qty 4

## 2016-03-02 MED ORDER — FENTANYL CITRATE (PF) 100 MCG/2ML IJ SOLN
INTRAMUSCULAR | Status: DC | PRN
  Administered 2016-03-02 (×2): 50 ug via INTRAVENOUS

## 2016-03-02 MED ORDER — OXYCODONE HCL 5 MG PO TABS: 5.0000 mg | ORAL_TABLET | Freq: Once | ORAL | Status: DC | PRN

## 2016-03-02 MED ORDER — PROMETHAZINE HCL 25 MG/ML IJ SOLN: 6.2500 mg | INTRAMUSCULAR | Status: DC | PRN

## 2016-03-02 MED ORDER — KETOROLAC TROMETHAMINE 30 MG/ML IJ SOLN
INTRAMUSCULAR | Status: AC
  Filled 2016-03-02: qty 1

## 2016-03-02 MED ORDER — OXYCODONE HCL 5 MG/5ML PO SOLN: 5.0000 mg | Freq: Once | ORAL | Status: DC | PRN

## 2016-03-02 MED ORDER — LIDOCAINE HCL (CARDIAC) 20 MG/ML IV SOLN
INTRAVENOUS | Status: AC
Start: 2016-03-02 — End: 2016-03-02
  Filled 2016-03-02: qty 5

## 2016-03-02 MED ORDER — LACTATED RINGERS IV SOLN: INTRAVENOUS | Status: DC

## 2016-03-02 MED ORDER — LIDOCAINE HCL 1 % IJ SOLN
INTRAMUSCULAR | Status: AC
  Filled 2016-03-02: qty 20

## 2016-03-02 MED ORDER — LACTATED RINGERS IV SOLN
INTRAVENOUS | Status: DC
  Administered 2016-03-02 (×2): via INTRAVENOUS

## 2016-03-02 SURGICAL SUPPLY — 25 items
BLADE SURG 15 STRL LF C SS BP (BLADE) ×1 IMPLANT
BLADE SURG 15 STRL SS (BLADE) ×1
CATH ROBINSON RED A/P 16FR (CATHETERS) ×2 IMPLANT
CLOTH BEACON ORANGE TIMEOUT ST (SAFETY) ×2 IMPLANT
CONTAINER PREFILL 10% NBF 15ML (MISCELLANEOUS) ×2 IMPLANT
COUNTER NEEDLE 1200 MAGNETIC (NEEDLE) ×2 IMPLANT
ELECT REM PT RETURN 9FT ADLT (ELECTROSURGICAL) ×2
ELECTRODE REM PT RTRN 9FT ADLT (ELECTROSURGICAL) ×1 IMPLANT
GLOVE BIO SURGEON STRL SZ 6.5 (GLOVE) ×2 IMPLANT
GLOVE BIOGEL PI IND STRL 7.0 (GLOVE) ×1 IMPLANT
GLOVE BIOGEL PI INDICATOR 7.0 (GLOVE) ×1
GOWN STRL REUS W/TWL LRG LVL3 (GOWN DISPOSABLE) ×4 IMPLANT
NEEDLE HYPO 22GX1.5 SAFETY (NEEDLE) IMPLANT
NS IRRIG 1000ML POUR BTL (IV SOLUTION) ×2 IMPLANT
PACK VAGINAL MINOR WOMEN LF (CUSTOM PROCEDURE TRAY) ×2 IMPLANT
PAD OB MATERNITY 4.3X12.25 (PERSONAL CARE ITEMS) ×2 IMPLANT
PAD PREP 24X48 CUFFED NSTRL (MISCELLANEOUS) ×2 IMPLANT
PENCIL BUTTON HOLSTER BLD 10FT (ELECTRODE) ×2 IMPLANT
SUT VIC AB 0 CT1 27 (SUTURE) ×2
SUT VIC AB 0 CT1 27XBRD ANBCTR (SUTURE) ×2 IMPLANT
SUT VIC AB 2-0 SH 27 (SUTURE) ×1
SUT VIC AB 2-0 SH 27XBRD (SUTURE) ×1 IMPLANT
SUT VICRYL RAPIDE 4/0 PS 2 (SUTURE) IMPLANT
TOWEL OR 17X24 6PK STRL BLUE (TOWEL DISPOSABLE) ×4 IMPLANT
WATER STERILE IRR 1000ML POUR (IV SOLUTION) IMPLANT

## 2016-03-02 NOTE — Discharge Instructions (Signed)
°  Post Anesthesia Home Care Instructions  Activity: Get plenty of rest for the remainder of the day. A responsible adult should stay with you for 24 hours following the procedure.  For the next 24 hours, DO NOT: -Drive a car -Paediatric nurse -Drink alcoholic beverages -Take any medication unless instructed by your physician -Make any legal decisions or sign important papers.  Meals: Start with liquid foods such as gelatin or soup. Progress to regular foods as tolerated. Avoid greasy, spicy, heavy foods. If nausea and/or vomiting occur, drink only clear liquids until the nausea and/or vomiting subsides. Call your physician if vomiting continues.  Special Instructions/Symptoms: Your throat may feel dry or sore from the anesthesia or the breathing tube placed in your throat during surgery. If this causes discomfort, gargle with warm salt water. The discomfort should disappear within 24 hours.  If you had a scopolamine patch placed behind your ear for the management of post- operative nausea and/or vomiting:  1. The medication in the patch is effective for 72 hours, after which it should be removed.  Wrap patch in a tissue and discard in the trash. Wash hands thoroughly with soap and water. 2. You may remove the patch earlier than 72 hours if you experience unpleasant side effects which may include dry mouth, dizziness or visual disturbances. 3. Avoid touching the patch. Wash your hands with soap and water after contact with the patch.   Place nothing in the vagina for 2 weeks.  Use pads if you have any vaginal bleeding or spotting.  Call if you develop any heavy vaginal bleeding, fever, or pain uncontrolled by Motrin.   You may eat as you desire.  You may wish to have light meals today as you recover from anesthesia.

## 2016-03-02 NOTE — Brief Op Note (Signed)
03/02/2016  11:15 AM  PATIENT:  Earleen Reaper  55 y.o. female  PRE-OPERATIVE DIAGNOSIS:  large vaginal polyp  POST-OPERATIVE DIAGNOSIS:  large vaginal polyp  PROCEDURE:  Procedure(s): excision of vaginal polyp (N/A)  SURGEON:  Surgeon(s) and Role:    * Athanasius Kesling E Yisroel Ramming, MD - Primary  PHYSICIAN ASSISTANT: NA  ASSISTANTS: none   ANESTHESIA:   general  EBL:  Total I/O In: 1000 [I.V.:1000] Out: 75 [Urine:75]  BLOOD ADMINISTERED:none  DRAINS: none   LOCAL MEDICATIONS USED:  NONE  SPECIMEN:  Source of Specimen:  vaginal polyp  DISPOSITION OF SPECIMEN:  PATHOLOGY  COUNTS:  YES  TOURNIQUET:  * No tourniquets in log *  DICTATION: .Other Dictation: Dictation Number    PLAN OF CARE: Discharge to home after PACU  PATIENT DISPOSITION:  PACU - hemodynamically stable.   Delay start of Pharmacological VTE agent (>24hrs) due to surgical blood loss or risk of bleeding: not applicable

## 2016-03-02 NOTE — Progress Notes (Signed)
Update to History and Physical  No marked change in status since office pre-op visit.   Patient examined.   OK to proceed with surgery. 

## 2016-03-02 NOTE — Op Note (Signed)
[name], [name] NO.:  [id number]  MEDICAL RECORD NO.:  [id number]  LOCATION:  [facility]                          FACILITY:  [facility]  PHYSICIAN:  Lenard Galloway, M.D.   DATE OF BIRTH:  May 15, 1961  DATE OF PROCEDURE:  03/02/2016 DATE OF DISCHARGE:                              OPERATIVE REPORT   PREOPERATIVE DIAGNOSIS:  Large vaginal polyp.  POSTOPERATIVE DIAGNOSIS:  Large vaginal polyp.  PROCEDURE:  Excision of vaginal polyp.  SURGEON:  Lenard Galloway, M.D.  ANESTHESIA:  General endotracheal.  IV FLUIDS:  1000 mL Ringer's lactate.  EBL:  0 mL.  URINE OUTPUT:  75 mL.  COMPLICATIONS:  None.  INDICATIONS FOR PROCEDURE:  The patient is a 55 year old Caucasian female status post abdominal hysterectomy for uterine fibroids at age 51, who presented for routine annual gynecologic examination and was noted to have a large vaginal apical polyp.  The polyp had a broad 1 cm base and was noted to measure almost 2 cm in length.  The patient had discomfort with vaginal examination in the office and a plan was therefore made to excise the polyp in an operative setting while under anesthesia.  Risks, benefits, and alternatives were reviewed with the patient who wished to proceed.  FINDINGS:  Examination under anesthesia revealed a 2 x 1 cm polyp of the left vaginal apex.  The cervix was absent.  There were no other vaginal lesions appreciated.  SPECIMEN:  The vaginal polyp was sent to Pathology.  DESCRIPTION OF PROCEDURE:  The patient was reidentified in the preoperative hold area.  She did receive cefotetan IV for antibiotic prophylaxis and she received TED hose and PAS stockings for DVT prophylaxis.  In the operating room, the patient was placed in the dorsal lithotomy position with the Allen stirrups.  General endotracheal anesthesia was induced.  The patient's lower abdomen, vagina, and perineum were then sterilely prepped and she was catheterized of urine.  An  examination under anesthesia was performed.  A speculum was placed inside the vagina and the large vaginal apical polyp was identified.  A weighted speculum was placed along with a Deaver retractor.  A ring forceps was used to grasp the tip of the polyp and then monopolar cautery was used to cauterize the polyp and excised it from the vaginal apex.  The specimen was sent to Pathology.  Hemostasis was excellent.  There was no blood loss.  The area was probed gently along the vaginal apex, and it was clear that this polyp did not communicate with any organ behind it.  Please note that the polyp was located more posteriorly along the vaginal apex than anteriorly.  This concluded the patient's procedure.  She was cleansed of Betadine. She was awakened and extubated and escorted to the recovery room in stable condition.  There were no complications to the procedure.  All instrument, needle, and sponge counts were correct.     Lenard Galloway, M.D.     BES/MEDQ  D:  03/02/2016  T:  03/02/2016  Job:  [id number]

## 2016-03-02 NOTE — Transfer of Care (Signed)
Immediate Anesthesia Transfer of Care Note  Patient: Marissa [name]  Procedure(s) Performed: Procedure(s): excision of vaginal polyp (N/A)  Patient Location: PACU  Anesthesia Type:General  Level of Consciousness: awake, alert  and oriented  Airway & Oxygen Therapy: Patient Spontanous Breathing and Patient connected to nasal cannula oxygen  Post-op Assessment: Report given to RN and Post -op Vital signs reviewed and stable  Post vital signs: Reviewed and stable  Last Vitals:  Vitals:   03/02/16 0952  BP: 131/80  Pulse: (!) 59  Resp: 14  Temp: 36.8 C    Last Pain:  Vitals:   03/02/16 0952  TempSrc: Oral         Complications: No apparent anesthesia complications

## 2016-03-02 NOTE — Anesthesia Procedure Notes (Signed)
Procedure Name: LMA Insertion Date/Time: 03/02/2016 10:55 AM Performed by: Brock Ra Pre-anesthesia Checklist: Patient identified, Emergency Drugs available, Suction available and Patient being monitored Patient Re-evaluated:Patient Re-evaluated prior to inductionOxygen Delivery Method: Circle system utilized Preoxygenation: Pre-oxygenation with 100% oxygen Intubation Type: IV induction LMA: LMA inserted LMA Size: 4.0 Number of attempts: 1 Placement Confirmation: positive ETCO2 and breath sounds checked- equal and bilateral Dental Injury: Teeth and Oropharynx as per pre-operative assessment

## 2016-03-02 NOTE — Anesthesia Preprocedure Evaluation (Signed)
Anesthesia Evaluation  Patient identified by MRN, date of birth, ID band Patient awake    Reviewed: Allergy & Precautions, NPO status , Patient's Chart, lab work & pertinent test results  Airway Mallampati: II  TM Distance: >3 FB Neck ROM: Full    Dental no notable dental hx.    Pulmonary sleep apnea ,    Pulmonary exam normal breath sounds clear to auscultation       Cardiovascular negative cardio ROS Normal cardiovascular exam Rhythm:Regular Rate:Normal     Neuro/Psych negative neurological ROS  negative psych ROS   GI/Hepatic negative GI ROS, Neg liver ROS,   Endo/Other  Morbid obesity  Renal/GU negative Renal ROS  negative genitourinary   Musculoskeletal negative musculoskeletal ROS (+)   Abdominal   Peds negative pediatric ROS (+)  Hematology negative hematology ROS (+)   Anesthesia Other Findings   Reproductive/Obstetrics negative OB ROS                             Anesthesia Physical Anesthesia Plan  ASA: III  Anesthesia Plan: General   Post-op Pain Management:    Induction: Intravenous  Airway Management Planned: LMA  Additional Equipment:   Intra-op Plan:   Post-operative Plan: Extubation in OR  Informed Consent: I have reviewed the patients History and Physical, chart, labs and discussed the procedure including the risks, benefits and alternatives for the proposed anesthesia with the patient or authorized representative who has indicated his/her understanding and acceptance.   Dental advisory given  Plan Discussed with: CRNA and Surgeon  Anesthesia Plan Comments:         Anesthesia Quick Evaluation

## 2016-03-03 ENCOUNTER — Encounter (HOSPITAL_COMMUNITY): Payer: Self-pay | Admitting: Obstetrics and Gynecology

## 2016-03-04 NOTE — Anesthesia Postprocedure Evaluation (Signed)
Anesthesia Post Note  Patient: Marissa [name]  Procedure(s) Performed: Procedure(s) (LRB): excision of vaginal polyp (N/A)  Patient location during evaluation: PACU Anesthesia Type: General Level of consciousness: awake and alert Pain management: pain level controlled Vital Signs Assessment: post-procedure vital signs reviewed and stable Respiratory status: spontaneous breathing, nonlabored ventilation, respiratory function stable and patient connected to nasal cannula oxygen Cardiovascular status: blood pressure returned to baseline and stable Postop Assessment: no signs of nausea or vomiting Anesthetic complications: no    Last Vitals:  Vitals:   03/02/16 1300 03/02/16 1320  BP: 131/76 135/76  Pulse: (!) 59 (!) 55  Resp: 19 18  Temp: 36.7 C     Last Pain:  Vitals:   03/02/16 0952  TempSrc: Oral                 Margo Lama S

## 2016-03-08 ENCOUNTER — Ambulatory Visit: Payer: 59 | Admitting: Obstetrics and Gynecology

## 2016-03-08 ENCOUNTER — Telehealth: Payer: Self-pay

## 2016-03-08 NOTE — Telephone Encounter (Signed)
Called patient at [phone] to discuss pathology report, left message to call me back.

## 2016-03-08 NOTE — Telephone Encounter (Signed)
-----   Message from Nunzio Cobbs, MD sent at 03/04/2016 11:15 AM EDT ----- Please inform patient of final surgical pathology showing a benign polyp of the vagina. She needs a 2 week post op visit scheduled with me.

## 2016-03-09 NOTE — Telephone Encounter (Signed)
Patient is calling to get her results °

## 2016-03-10 NOTE — Telephone Encounter (Signed)
Pt notified in result note.  Closing encounter. 

## 2016-03-17 ENCOUNTER — Encounter: Payer: Self-pay | Admitting: Obstetrics and Gynecology

## 2016-03-17 ENCOUNTER — Ambulatory Visit (INDEPENDENT_AMBULATORY_CARE_PROVIDER_SITE_OTHER): Payer: 59 | Admitting: Obstetrics and Gynecology

## 2016-03-17 VITALS — BP 116/72 | HR 68 | Resp 18 | Ht 64.0 in | Wt 252.0 lb

## 2016-03-17 DIAGNOSIS — N842 Polyp of vagina: Secondary | ICD-10-CM | POA: Diagnosis not present

## 2016-03-17 NOTE — Progress Notes (Signed)
GYNECOLOGY  VISIT   HPI: 55 y.o.   Single  Caucasian  female   G0P0 with Patient's last menstrual period was 08/20/2002.   here for 2 week post op.    Status post vaginal polyp excision.   Path - benign fibroepithelial polyp.   Denies any problems.   Having right knee replacement in November.   Going to [place] to scuba dive before surgery.  GYNECOLOGIC HISTORY: Patient's last menstrual period was 08/20/2002. Contraception:  Hysterectomy Menopausal hormone therapy:  none Last mammogram:  11/28/15 [facility] Last pap smear:   12/24/15 WNL neg HR HPV        OB History    Gravida Para Term Preterm AB Living   0             SAB TAB Ectopic Multiple Live Births                     Patient Active Problem List   Diagnosis Date Noted  . Cough 08/16/2011  . Right hand pain 01/18/2011  . OBSTRUCTIVE SLEEP APNEA 07/10/2010  . Intrinsic asthma, unspecified 06/25/2010  . DYSPNEA ON EXERTION 05/14/2010  . HYPERLIPIDEMIA 07/14/2007  . DEPRESSION 07/14/2007  . GERD 07/14/2007  . HIATAL HERNIA 04/06/2007    Past Medical History:  Diagnosis Date  . Abnormal uterine bleeding    due to fibroids  . Arthritis    knee, scheduled for knee replacement 04/2016  . Asthma    exercise induced-no inhalers  . Dysmenorrhea    2000  from fibroids  . Family history of adverse reaction to anesthesia 02/26/2016   father had shoulder replacement-admitted back to hospital for related complications  . Fibroid   . Genital warts 1994   Tx  x2  . GERD (gastroesophageal reflux disease)   . Hyperlipidemia   . Sleep apnea    has a cpap-cannot use-makes her sick    Past Surgical History:  Procedure Laterality Date  . ABDOMINAL HYSTERECTOMY  09/11/2002   ovaries retained. 2o fibroids  . APPENDECTOMY  1980  . BREAST SURGERY  1997   cyst left breast rem. benign  . COLONOSCOPY  06/2015   polyp recheck 5 yrs  . EXCISION VAGINAL CYST N/A 03/02/2016   Procedure: excision of  vaginal polyp;  Surgeon: Nunzio Cobbs, MD;  Location: [facility];  Service: Gynecology;  Laterality: N/A;  . KNEE ARTHROSCOPY Right 07/16/2013   Procedure: RIGHT ARTHROSCOPY KNEE, partial lateral menisectomy and chrondromalasia, with microfracture of lateral femeral chondyl, and excision of plica ;  Surgeon: Kerin Salen, MD;  Location: [facility];  Service: Orthopedics;  Laterality: Right;  . RECONSTRUCTION OF NOSE  1986  . SHOULDER ARTHROSCOPY WITH ROTATOR CUFF REPAIR AND SUBACROMIAL DECOMPRESSION Left 08/02/2014   Procedure: SHOULDER ARTHROSCOPY WITH ROTATOR CUFF REPAIR AND SUBACROMIAL DECOMPRESSION;  Surgeon: Hessie Dibble, MD;  Location: [facility];  Service: Orthopedics;  Laterality: Left;  . [place]   rt  . STERIOD INJECTION Left 08/02/2014   Procedure: LEFT THUMB INJECTION;  Surgeon: Hessie Dibble, MD;  Location: [facility];  Service: Orthopedics;  Laterality: Left;    Current Outpatient Prescriptions  Medication Sig Dispense Refill  . ibuprofen (ADVIL,MOTRIN) 800 MG tablet Take 1 tablet (800 mg total) by mouth every 8 (eight) hours as needed. 30 tablet 0  . Lactobacillus (PROBIOTIC ACIDOPHILUS) CAPS Take 1 capsule by mouth daily.     Marland Kitchen  Multiple Vitamin (THERA) TABS Take 1 tablet by mouth daily.    Marland Kitchen omeprazole-sodium bicarbonate (ZEGERID) 40-1100 MG capsule Take 1 capsule by mouth daily as needed (acid reflux).     Marland Kitchen OVER THE COUNTER MEDICATION Take 1 capsule by mouth 2 (two) times daily. SF dietary supplement for weight management    . OVER THE COUNTER MEDICATION Take 1 capsule by mouth daily. Curamed and din complex, estrogen balance    . OVER THE COUNTER MEDICATION Take 1 capsule by mouth 3 (three) times daily. Fat Grabbers supplement    . polyethylene glycol (MIRALAX / GLYCOLAX) packet Take 17 g by mouth as needed for mild constipation or moderate constipation.      No current facility-administered medications  for this visit.      ALLERGIES: Statins  Family History  Problem Relation Age of Onset  . Diabetes Mother   . Hyperlipidemia Mother   . Hypertension Mother   . Ulcers Mother   . Fibromyalgia Mother   . Sudden death Maternal Grandfather   . Cancer Maternal Grandmother   . Cancer Paternal Grandmother     breast/liver  . Hyperlipidemia Father   . Seizures Father   . Hypertension Father   . Hyperlipidemia Brother   . Heart attack Neg Hx     Social History   Social History  . Marital status: Single    Spouse name: N/A  . Number of children: N/A  . Years of education: N/A   Occupational History  . Not on file.   Social History Main Topics  . Smoking status: Never Smoker  . Smokeless tobacco: Never Used  . Alcohol use 1.8 oz/week    1 Glasses of wine, 1 Cans of beer, 1 Shots of liquor per week  . Drug use: No  . Sexual activity: Yes    Partners: Male    Birth control/ protection: Surgical     Comment: Hysterectomy   Other Topics Concern  . Not on file   Social History Narrative  . No narrative on file    ROS:  Pertinent items are noted in HPI.  PHYSICAL EXAMINATION:    LMP 08/20/2002     General appearance: alert, cooperative and appears stated age   Pelvic: External genitalia:  no lesions              Urethra:  normal appearing urethra with no masses, tenderness or lesions              Bartholins and Skenes: normal                 Vagina: normal appearing vagina with normal color and discharge, no lesions              Cervix: absent                Bimanual Exam:  Uterus:   Absent.              Adnexa: no mass, fullness, tenderness             Chaperone was present for exam.  ASSESSMENT  Status post excision of benign vaginal polyp.  PLAN  Surgical findings, procedure, and pathology report reviewed. Ok to resume all normal activities.  Follow up for annual exam with Ed[facility] Circle and prn.  An After Visit Summary was printed and given to the  patient.  ___10___ minutes face to face time of which over 50% was spent in counseling.

## 2016-03-17 NOTE — Patient Instructions (Signed)
You may resume all normal activities!

## 2016-04-15 ENCOUNTER — Other Ambulatory Visit (HOSPITAL_COMMUNITY): Payer: 59

## 2016-04-21 ENCOUNTER — Encounter (HOSPITAL_COMMUNITY): Payer: Self-pay | Admitting: Physician Assistant

## 2016-04-21 DIAGNOSIS — M1711 Unilateral primary osteoarthritis, right knee: Secondary | ICD-10-CM | POA: Diagnosis present

## 2016-04-21 DIAGNOSIS — M1731 Unilateral post-traumatic osteoarthritis, right knee: Secondary | ICD-10-CM

## 2016-04-21 HISTORY — DX: Unilateral post-traumatic osteoarthritis, right knee: M17.31

## 2016-04-21 NOTE — H&P (Addendum)
TOTAL KNEE ADMISSION H&P  Patient is being admitted for right total knee arthroplasty.  Subjective:  Chief Complaint:right knee pain.  HPI: Marissa [name], 55 y.o. female, has a history of pain and functional disability in the right knee due to traumaand has failed non-surgical conservative treatments for greater than 12 weeks to includeNSAID's and/or analgesics, corticosteriod injections, viscosupplementation injections, flexibility and strengthening excercises and supervised PT with diminished ADL's post treatment. Ms. Oman is a 55 year-old seen for evaluation for persistent significant right knee pain secondary to her work related injury with posttraumatic osteoarthritis.  She is one year status post right knee repeat arthroscopy for partial meniscectomy and chondroplasty.  She underwent an MRI of the knee on January 16, 2016 that has revealed full thickness cartilage loss of the patellofemoral joint and significant full thickness cartilage loss of the lateral compartment, as well as moderate cartilage loss medially, as well as attenuation of the lateral meniscus.  She continues to have significant pain.    Patient currently rates pain in the right knee(s) at 10 out of 10 with activity. Patient has night pain, worsening of pain with activity and weight bearing, pain that interferes with activities of daily living, crepitus and joint swelling.  Patient has evidence of subchondral sclerosis, periarticular osteophytes and joint space narrowing by imaging studies.  There is no active infection.  Patient Active Problem List   Diagnosis Date Noted  . Unilateral post-traumatic osteoarthritis, right knee 04/21/2016  . Primary localized osteoarthrosis of the knee, right   . Cough 08/16/2011  . Right hand pain 01/18/2011  . Obstructive sleep apnea 07/10/2010  . Intrinsic asthma 06/25/2010  . DYSPNEA ON EXERTION 05/14/2010  . Hyperlipidemia 07/14/2007  . GERD 07/14/2007  . Diaphragmatic hernia  04/06/2007   Past Medical History:  Diagnosis Date  . Abnormal uterine bleeding    due to fibroids  . Arthritis    knee, scheduled for knee replacement 04/2016  . Asthma    exercise induced-no inhalers  . Dysmenorrhea    2000  from fibroids  . Family history of adverse reaction to anesthesia 02/26/2016   father had shoulder replacement-admitted back to hospital for related complications  . Fibroid   . Genital warts 1994   Tx  x2  . GERD (gastroesophageal reflux disease)   . Hyperlipidemia   . Primary localized osteoarthrosis of the knee, right   . Sleep apnea    has a cpap-cannot use-makes her sick  . Unilateral post-traumatic osteoarthritis, right knee 04/21/2016    Past Surgical History:  Procedure Laterality Date  . ABDOMINAL HYSTERECTOMY  09/11/2002   ovaries retained. 2o fibroids  . APPENDECTOMY  1980  . BREAST SURGERY  1997   cyst left breast rem. benign  . COLONOSCOPY  06/2015   polyp recheck 5 yrs  . EXCISION VAGINAL CYST N/A 03/02/2016   Procedure: excision of vaginal polyp;  Surgeon: Nunzio Cobbs, MD;  Location: [facility];  Service: Gynecology;  Laterality: N/A;  . KNEE ARTHROSCOPY Right 07/16/2013   Procedure: RIGHT ARTHROSCOPY KNEE, partial lateral menisectomy and chrondromalasia, with microfracture of lateral femeral chondyl, and excision of plica ;  Surgeon: Kerin Salen, MD;  Location: [facility];  Service: Orthopedics;  Laterality: Right;  . RECONSTRUCTION OF NOSE  1986  . SHOULDER ARTHROSCOPY WITH ROTATOR CUFF REPAIR AND SUBACROMIAL DECOMPRESSION Left 08/02/2014   Procedure: SHOULDER ARTHROSCOPY WITH ROTATOR CUFF REPAIR AND SUBACROMIAL DECOMPRESSION;  Surgeon: Hessie Dibble, MD;  Location: [facility];  Service: Orthopedics;  Laterality: Left;  . [place]   rt  . STERIOD INJECTION Left 08/02/2014   Procedure: LEFT THUMB INJECTION;  Surgeon: Hessie Dibble, MD;  Location: [facility];  Service:  Orthopedics;  Laterality: Left;    No current facility-administered medications for this encounter.   Current Outpatient Prescriptions:  .  Lactobacillus (PROBIOTIC ACIDOPHILUS) CAPS, Take 1 capsule by mouth daily. , Disp: , Rfl:  .  Multiple Vitamin (THERA) TABS, Take 1 tablet by mouth daily., Disp: , Rfl:  .  omeprazole-sodium bicarbonate (ZEGERID) 40-1100 MG capsule, Take 1 capsule by mouth daily as needed (acid reflux). , Disp: , Rfl:  .  rosuvastatin (CRESTOR) 10 MG tablet, Take 10 mg by mouth at bedtime., Disp: , Rfl:  .  ibuprofen (ADVIL,MOTRIN) 800 MG tablet, Take 1 tablet (800 mg total) by mouth every 8 (eight) hours as needed., Disp: 30 tablet, Rfl: 0 .  OVER THE COUNTER MEDICATION, Take 1 capsule by mouth 2 (two) times daily. SF dietary supplement for weight management, Disp: , Rfl:  .  OVER THE COUNTER MEDICATION, Take 1 capsule by mouth daily. Curamed and din complex, estrogen balance, Disp: , Rfl:  .  OVER THE COUNTER MEDICATION, Take 1 capsule by mouth 3 (three) times daily. Fat Grabbers supplement, Disp: , Rfl:  .  polyethylene glycol (MIRALAX / GLYCOLAX) packet, Take 17 g by mouth as needed for mild constipation or moderate constipation. , Disp: , Rfl:     Allergies  Allergen Reactions  . Statins Other (See Comments)    Muscle and joint    Social History  Substance Use Topics  . Smoking status: Never Smoker  . Smokeless tobacco: Never Used  . Alcohol use 1.8 oz/week    1 Glasses of wine, 1 Cans of beer, 1 Shots of liquor per week    Family History  Problem Relation Age of Onset  . Diabetes Mother   . Hyperlipidemia Mother   . Hypertension Mother   . Ulcers Mother   . Fibromyalgia Mother   . Sudden death Maternal Grandfather   . Cancer Maternal Grandmother   . Cancer Paternal Grandmother     breast/liver  . Hyperlipidemia Father   . Seizures Father   . Hypertension Father   . Hyperlipidemia Brother   . Heart attack Neg Hx      Review of Systems   Constitutional: Negative.   HENT: Positive for congestion.   Eyes: Negative.   Respiratory: Negative.   Cardiovascular: Negative.   Gastrointestinal: Negative.   Genitourinary: Negative.   Musculoskeletal: Positive for back pain and joint pain.  Skin: Negative.   Neurological: Negative.   Endo/Heme/Allergies: Negative.   Psychiatric/Behavioral: Negative.     Objective:  Physical Exam  Constitutional: She is oriented to person, place, and time. She appears well-developed and well-nourished.  HENT:  Head: Normocephalic and atraumatic.  Mouth/Throat: Oropharynx is clear and moist.  Eyes: Conjunctivae are normal. Pupils are equal, round, and reactive to light.  Neck: Neck supple.  Cardiovascular: Normal rate and regular rhythm.   Respiratory: Effort normal and breath sounds normal.  GI: Soft. Bowel sounds are normal.  Genitourinary:  Genitourinary Comments: Not pertinent to current symptomatology therefore not examined.  Musculoskeletal:  Right knee-5-120 3+ crepitus 2+ synovitis normal patella tracking.  Left knee full range of motion with mild pain and swelling. 2+ DP  Neurological: She is alert and oriented to person, place, and time.  Skin: Skin is warm and dry.  Psychiatric: She has a normal mood and affect. Her behavior is normal.    Vital signs in last 24 hours: Temp:  [98.1 F (36.7 C)] 98.1 F (36.7 C) (11/01 1400) Pulse Rate:  [73] 73 (11/01 1400) BP: (115)/(76) 115/76 (11/01 1400) SpO2:  [98 %] 98 % (11/01 1400) Weight:  [112.5 kg (248 lb)] 112.5 kg (248 lb) (11/01 1400)  Labs:   Estimated body mass index is 41.27 kg/m as calculated from the following:   Height as of this encounter: 5\' 5"  (1.651 m).   Weight as of this encounter: 112.5 kg (248 lb).   Imaging Review Plain radiographs demonstrate severe degenerative joint disease of the right knee(s). The overall alignment issignificant varus. The bone quality appears to be good for age and reported  activity level.  Assessment/Plan:  End stage arthritis, right knee  Principal Problem:   Unilateral post-traumatic osteoarthritis, right knee Active Problems:   Hyperlipidemia   GERD   Diaphragmatic hernia   Obstructive sleep apnea   Intrinsic asthma   Primary localized osteoarthrosis of the knee, right  The patient history, physical examination, clinical judgment of the provider and imaging studies are consistent with end stage degenerative joint disease of the right knee(s) and total knee arthroplasty is deemed medically necessary. The treatment options including medical management, injection therapy arthroscopy and arthroplasty were discussed at length. The risks and benefits of total knee arthroplasty were presented and reviewed. The risks due to aseptic loosening, infection, stiffness, patella tracking problems, thromboembolic complications and other imponderables were discussed. The patient acknowledged the explanation, agreed to proceed with the plan and consent was signed. Patient is being admitted for inpatient treatment for surgery, pain control, PT, OT, prophylactic antibiotics, VTE prophylaxis, progressive ambulation and ADL's and discharge planning. The patient is planning to be discharged home with home health services PATIENT IS ON [place] UTI.

## 2016-04-23 ENCOUNTER — Encounter (HOSPITAL_COMMUNITY)
Admission: RE | Admit: 2016-04-23 | Discharge: 2016-04-23 | Disposition: A | Discharge status: Home / Self Care | Payer: Worker's Compensation | Source: Ambulatory Visit | Attending: Orthopedic Surgery | Admitting: Orthopedic Surgery

## 2016-04-23 ENCOUNTER — Other Ambulatory Visit: Payer: Self-pay

## 2016-04-23 ENCOUNTER — Encounter (HOSPITAL_COMMUNITY): Payer: Self-pay

## 2016-04-23 ENCOUNTER — Ambulatory Visit (HOSPITAL_COMMUNITY)
Admission: RE | Admit: 2016-04-23 | Discharge: 2016-04-23 | Disposition: A | Discharge status: Home / Self Care | Payer: Worker's Compensation | Source: Ambulatory Visit | Attending: Physician Assistant | Admitting: Physician Assistant

## 2016-04-23 DIAGNOSIS — Z01818 Encounter for other preprocedural examination: Secondary | ICD-10-CM | POA: Insufficient documentation

## 2016-04-23 DIAGNOSIS — J988 Other specified respiratory disorders: Secondary | ICD-10-CM

## 2016-04-23 HISTORY — DX: Unspecified intracranial injury with loss of consciousness of unspecified duration, initial encounter: S06.9X9A

## 2016-04-23 LAB — CBC WITH DIFFERENTIAL/PLATELET
Basophils Absolute: 0.1 10*3/uL (ref 0.0–0.1)
Basophils Relative: 1 %
EOS ABS: 0.2 10*3/uL (ref 0.0–0.7)
Eosinophils Relative: 2 %
HEMATOCRIT: 41.5 % (ref 36.0–46.0)
HEMOGLOBIN: 14.3 g/dL (ref 12.0–15.0)
LYMPHS ABS: 3.2 10*3/uL (ref 0.7–4.0)
LYMPHS PCT: 31 %
MCH: 32.9 pg (ref 26.0–34.0)
MCHC: 34.5 g/dL (ref 30.0–36.0)
MCV: 95.6 fL (ref 78.0–100.0)
Monocytes Absolute: 0.7 10*3/uL (ref 0.1–1.0)
Monocytes Relative: 7 %
NEUTROS ABS: 6 10*3/uL (ref 1.7–7.7)
NEUTROS PCT: 59 %
Platelets: 335 10*3/uL (ref 150–400)
RBC: 4.34 MIL/uL (ref 3.87–5.11)
RDW: 12.4 % (ref 11.5–15.5)
WBC: 10.1 10*3/uL (ref 4.0–10.5)

## 2016-04-23 LAB — COMPREHENSIVE METABOLIC PANEL
ALK PHOS: 76 U/L (ref 38–126)
ALT: 47 U/L (ref 14–54)
AST: 32 U/L (ref 15–41)
Albumin: 3.7 g/dL (ref 3.5–5.0)
Anion gap: 6 (ref 5–15)
BILIRUBIN TOTAL: 0.6 mg/dL (ref 0.3–1.2)
BUN: 12 mg/dL (ref 6–20)
CALCIUM: 9.6 mg/dL (ref 8.9–10.3)
CO2: 25 mmol/L (ref 22–32)
CREATININE: 0.94 mg/dL (ref 0.44–1.00)
Chloride: 106 mmol/L (ref 101–111)
GFR calc non Af Amer: >60 mL/min (ref >60)
GLUCOSE: 93 mg/dL (ref 65–99)
Potassium: 4.1 mmol/L (ref 3.5–5.1)
SODIUM: 137 mmol/L (ref 135–145)
Total Protein: 6.7 g/dL (ref 6.5–8.1)

## 2016-04-23 LAB — PROTIME-INR
INR: 1.03
Prothrombin Time: 13.5 seconds (ref 11.4–15.2)

## 2016-04-23 LAB — SURGICAL PCR SCREEN
MRSA, PCR: NEGATIVE
Staphylococcus aureus: NEGATIVE

## 2016-04-23 LAB — APTT: APTT: 31 s (ref 24–36)

## 2016-04-23 LAB — TYPE AND SCREEN
ABO/RH(D): A POS
Antibody Screen: NEGATIVE

## 2016-04-23 LAB — ABO/RH: ABO/RH(D): A POS

## 2016-04-23 NOTE — Pre-Procedure Instructions (Signed)
    Marissa [name]  04/23/2016     Your procedure is scheduled on Monday, November 13.  Report to [facility] Admitting at 5:30 AM              Your surgery or procedure is scheduled for 7:15 AM.   Call this number if you have problems the morning of surgery:[phone] -pre- op desk                 For any other questions, please call [phone], Monday - Friday 8 AM - 4 PM.   Remember:  Do not eat food or drink liquids after midnight September 12.             Take these medicines the morning of surgery with A SIP OF WATER: None            Take if needed: omeprazole-sodium bicarbonate (ZEGERID).                  1 Week prior to surgery STOP taking Aspirin , Aspirin Products (Goody Powder, Excedrin Migraine), Ibuprofen (Advil), Naproxen (Aleve), Vitamins and Herbal Products (ie Fish Oil)   Do not wear jewelry, make-up or nail polish.  Do not wear lotions, powders, or perfumes, or deodorant.  Do not shave 48 hours prior to surgery.    Do not bring valuables to the hospital.  [facility] is not responsible for any belongings or valuables.  Contacts, dentures or bridgework may not be worn into surgery.  Leave your suitcase in the car.  After surgery it may be brought to your room.  For patients admitted to the hospital, discharge time will be determined by your treatment team.  Special instructions:  Review  [facility] - Preparing For Surgery.  Please read over the following fact sheets that you were given: [facility]- Preparing For Surgery and Patient Instructions for Mupirocin Application, Incentive Spirometry, Pain Booklet

## 2016-04-23 NOTE — Progress Notes (Signed)
[facility] does not carry patients TED hose size, I left a message for Sherri at Dr Archie Endo office.

## 2016-04-25 LAB — URINE CULTURE: Culture: 40000 — AB

## 2016-04-27 NOTE — Progress Notes (Signed)
Left a message for Judeen Hammans at Dr.Wainer's office that urine culture shows e coli.

## 2016-05-02 MED ORDER — DEXTROSE 5 % IV SOLN
3.0000 g | INTRAVENOUS | Status: DC
  Filled 2016-05-02: qty 3000

## 2016-05-02 MED ORDER — CEFAZOLIN SODIUM-DEXTROSE 2-4 GM/100ML-% IV SOLN
2.0000 g | Freq: Once | INTRAVENOUS | Status: AC
  Administered 2016-05-03: 2 g via INTRAVENOUS
  Filled 2016-05-02: qty 100

## 2016-05-03 ENCOUNTER — Inpatient Hospital Stay (HOSPITAL_COMMUNITY)
Admission: RE | Admit: 2016-05-03 | Discharge: 2016-05-04 | DRG: 470 | Disposition: A | Discharge status: Home / Self Care | Payer: Worker's Compensation | Source: Ambulatory Visit | Attending: Orthopedic Surgery | Admitting: Orthopedic Surgery

## 2016-05-03 ENCOUNTER — Encounter (HOSPITAL_COMMUNITY)
Admission: RE | Disposition: A | Discharge status: Home / Self Care | Payer: Self-pay | Source: Ambulatory Visit | Attending: Orthopedic Surgery

## 2016-05-03 ENCOUNTER — Inpatient Hospital Stay (HOSPITAL_COMMUNITY): Payer: Worker's Compensation | Admitting: Anesthesiology

## 2016-05-03 ENCOUNTER — Encounter (HOSPITAL_COMMUNITY): Payer: Self-pay | Admitting: Anesthesiology

## 2016-05-03 DIAGNOSIS — N390 Urinary tract infection, site not specified: Secondary | ICD-10-CM | POA: Diagnosis present

## 2016-05-03 DIAGNOSIS — M1711 Unilateral primary osteoarthritis, right knee: Secondary | ICD-10-CM | POA: Diagnosis present

## 2016-05-03 DIAGNOSIS — X58XXXA Exposure to other specified factors, initial encounter: Secondary | ICD-10-CM | POA: Diagnosis present

## 2016-05-03 DIAGNOSIS — B9620 Unspecified Escherichia coli [E. coli] as the cause of diseases classified elsewhere: Secondary | ICD-10-CM | POA: Diagnosis present

## 2016-05-03 DIAGNOSIS — K219 Gastro-esophageal reflux disease without esophagitis: Secondary | ICD-10-CM | POA: Diagnosis present

## 2016-05-03 DIAGNOSIS — F32A Depression, unspecified: Secondary | ICD-10-CM | POA: Diagnosis present

## 2016-05-03 DIAGNOSIS — G4733 Obstructive sleep apnea (adult) (pediatric): Secondary | ICD-10-CM | POA: Diagnosis present

## 2016-05-03 DIAGNOSIS — J45909 Unspecified asthma, uncomplicated: Secondary | ICD-10-CM | POA: Diagnosis present

## 2016-05-03 DIAGNOSIS — E785 Hyperlipidemia, unspecified: Secondary | ICD-10-CM | POA: Diagnosis present

## 2016-05-03 DIAGNOSIS — M1731 Unilateral post-traumatic osteoarthritis, right knee: Principal | ICD-10-CM | POA: Diagnosis present

## 2016-05-03 DIAGNOSIS — K449 Diaphragmatic hernia without obstruction or gangrene: Secondary | ICD-10-CM

## 2016-05-03 DIAGNOSIS — Z79899 Other long term (current) drug therapy: Secondary | ICD-10-CM | POA: Diagnosis not present

## 2016-05-03 DIAGNOSIS — M25561 Pain in right knee: Secondary | ICD-10-CM | POA: Diagnosis present

## 2016-05-03 DIAGNOSIS — F329 Major depressive disorder, single episode, unspecified: Secondary | ICD-10-CM | POA: Diagnosis present

## 2016-05-03 DIAGNOSIS — Z888 Allergy status to other drugs, medicaments and biological substances status: Secondary | ICD-10-CM | POA: Diagnosis not present

## 2016-05-03 DIAGNOSIS — Z6841 Body Mass Index (BMI) 40.0 and over, adult: Secondary | ICD-10-CM

## 2016-05-03 DIAGNOSIS — R262 Difficulty in walking, not elsewhere classified: Secondary | ICD-10-CM

## 2016-05-03 HISTORY — PX: TOTAL KNEE ARTHROPLASTY: SHX125

## 2016-05-03 HISTORY — DX: Unilateral primary osteoarthritis, right knee: M17.11

## 2016-05-03 HISTORY — DX: Unilateral post-traumatic osteoarthritis, right knee: M17.31

## 2016-05-03 SURGERY — ARTHROPLASTY, KNEE, TOTAL
Anesthesia: General | Site: Knee | Laterality: Right

## 2016-05-03 MED ORDER — MENTHOL 3 MG MT LOZG
1.0000 | LOZENGE | OROMUCOSAL | Status: DC | PRN
Start: 2016-05-03 — End: 2016-05-05

## 2016-05-03 MED ORDER — POLYETHYLENE GLYCOL 3350 17 G PO PACK
17.0000 g | PACK | Freq: Two times a day (BID) | ORAL | Status: DC
  Administered 2016-05-03 – 2016-05-04 (×3): 17 g via ORAL
  Filled 2016-05-03 (×3): qty 1

## 2016-05-03 MED ORDER — LIDOCAINE HCL (CARDIAC) 20 MG/ML IV SOLN
INTRAVENOUS | Status: DC | PRN
  Administered 2016-05-03 (×2): 50 mg via INTRAVENOUS

## 2016-05-03 MED ORDER — FENTANYL CITRATE (PF) 100 MCG/2ML IJ SOLN
INTRAMUSCULAR | Status: DC | PRN
  Administered 2016-05-03: 50 ug via INTRAVENOUS
  Administered 2016-05-03: 25 ug via INTRAVENOUS
  Administered 2016-05-03: 50 ug via INTRAVENOUS

## 2016-05-03 MED ORDER — PROPOFOL 1000 MG/100ML IV EMUL
INTRAVENOUS | Status: AC
  Filled 2016-05-03: qty 200

## 2016-05-03 MED ORDER — PANTOPRAZOLE SODIUM 40 MG PO TBEC
40.0000 mg | DELAYED_RELEASE_TABLET | Freq: Every day | ORAL | Status: DC
  Administered 2016-05-04: 40 mg via ORAL
  Filled 2016-05-03: qty 1

## 2016-05-03 MED ORDER — ACETAMINOPHEN 650 MG RE SUPP: 650.0000 mg | Freq: Four times a day (QID) | RECTAL | Status: DC | PRN

## 2016-05-03 MED ORDER — SACCHAROMYCES BOULARDII 250 MG PO CAPS
250.0000 mg | ORAL_CAPSULE | Freq: Every day | ORAL | Status: DC
  Administered 2016-05-03 – 2016-05-04 (×2): 250 mg via ORAL
  Filled 2016-05-03 (×2): qty 1

## 2016-05-03 MED ORDER — ALUM & MAG HYDROXIDE-SIMETH 200-200-20 MG/5ML PO SUSP: 30.0000 mL | ORAL | Status: DC | PRN

## 2016-05-03 MED ORDER — LACTATED RINGERS IV SOLN
INTRAVENOUS | Status: DC
  Administered 2016-05-03 (×2): via INTRAVENOUS

## 2016-05-03 MED ORDER — TRANEXAMIC ACID 1000 MG/10ML IV SOLN
1000.0000 mg | INTRAVENOUS | Status: DC
  Filled 2016-05-03: qty 10

## 2016-05-03 MED ORDER — DIPHENHYDRAMINE HCL 12.5 MG/5ML PO ELIX: 12.5000 mg | ORAL_SOLUTION | ORAL | Status: DC | PRN

## 2016-05-03 MED ORDER — APIXABAN 2.5 MG PO TABS
2.5000 mg | ORAL_TABLET | Freq: Two times a day (BID) | ORAL | Status: DC
  Administered 2016-05-04: 2.5 mg via ORAL
  Filled 2016-05-03: qty 1

## 2016-05-03 MED ORDER — POTASSIUM CHLORIDE IN NACL 20-0.9 MEQ/L-% IV SOLN
INTRAVENOUS | Status: DC
  Administered 2016-05-03 – 2016-05-04 (×2): via INTRAVENOUS
  Filled 2016-05-03 (×2): qty 1000

## 2016-05-03 MED ORDER — ONDANSETRON HCL 4 MG PO TABS: 4.0000 mg | ORAL_TABLET | Freq: Four times a day (QID) | ORAL | Status: DC | PRN

## 2016-05-03 MED ORDER — BUPIVACAINE HCL (PF) 0.25 % IJ SOLN
INTRAMUSCULAR | Status: AC
  Filled 2016-05-03: qty 30

## 2016-05-03 MED ORDER — BUPIVACAINE-EPINEPHRINE 0.25% -1:200000 IJ SOLN
INTRAMUSCULAR | Status: DC | PRN
  Administered 2016-05-03: 30 mL

## 2016-05-03 MED ORDER — CEFAZOLIN SODIUM-DEXTROSE 2-4 GM/100ML-% IV SOLN
2.0000 g | Freq: Four times a day (QID) | INTRAVENOUS | Status: AC
  Administered 2016-05-03 (×2): 2 g via INTRAVENOUS
  Filled 2016-05-03 (×2): qty 100

## 2016-05-03 MED ORDER — OXYCODONE HCL 5 MG PO TABS
5.0000 mg | ORAL_TABLET | ORAL | Status: DC | PRN
  Administered 2016-05-03 – 2016-05-04 (×7): 10 mg via ORAL
  Filled 2016-05-03 (×7): qty 2

## 2016-05-03 MED ORDER — OXYCODONE HCL 5 MG PO TABS
ORAL_TABLET | ORAL | Status: AC
  Filled 2016-05-03: qty 2

## 2016-05-03 MED ORDER — PHENOL 1.4 % MT LIQD: 1.0000 | OROMUCOSAL | Status: DC | PRN

## 2016-05-03 MED ORDER — DEXAMETHASONE SODIUM PHOSPHATE 10 MG/ML IJ SOLN
10.0000 mg | Freq: Three times a day (TID) | INTRAMUSCULAR | Status: DC
  Administered 2016-05-03 – 2016-05-04 (×3): 10 mg via INTRAVENOUS
  Filled 2016-05-03 (×4): qty 1

## 2016-05-03 MED ORDER — CHLORHEXIDINE GLUCONATE 4 % EX LIQD: 60.0000 mL | Freq: Once | CUTANEOUS | Status: DC

## 2016-05-03 MED ORDER — CEFUROXIME AXETIL 500 MG PO TABS
500.0000 mg | ORAL_TABLET | Freq: Two times a day (BID) | ORAL | Status: DC
  Administered 2016-05-04 (×2): 500 mg via ORAL
  Filled 2016-05-03 (×3): qty 1

## 2016-05-03 MED ORDER — DEXAMETHASONE SODIUM PHOSPHATE 10 MG/ML IJ SOLN
INTRAMUSCULAR | Status: DC | PRN
  Administered 2016-05-03: 10 mg via INTRAVENOUS

## 2016-05-03 MED ORDER — FENTANYL CITRATE (PF) 100 MCG/2ML IJ SOLN
INTRAMUSCULAR | Status: AC
  Filled 2016-05-03: qty 2

## 2016-05-03 MED ORDER — PROPOFOL 10 MG/ML IV BOLUS
INTRAVENOUS | Status: AC
  Filled 2016-05-03: qty 40

## 2016-05-03 MED ORDER — LIDOCAINE 2% (20 MG/ML) 5 ML SYRINGE
INTRAMUSCULAR | Status: AC
  Filled 2016-05-03: qty 5

## 2016-05-03 MED ORDER — PROPOFOL 500 MG/50ML IV EMUL
INTRAVENOUS | Status: DC | PRN
  Administered 2016-05-03: 25 ug/kg/min via INTRAVENOUS

## 2016-05-03 MED ORDER — CELECOXIB 200 MG PO CAPS
200.0000 mg | ORAL_CAPSULE | Freq: Two times a day (BID) | ORAL | Status: DC
  Administered 2016-05-03 – 2016-05-04 (×3): 200 mg via ORAL
  Filled 2016-05-03 (×3): qty 1

## 2016-05-03 MED ORDER — ONDANSETRON HCL 4 MG/2ML IJ SOLN
4.0000 mg | Freq: Four times a day (QID) | INTRAMUSCULAR | Status: DC | PRN
  Administered 2016-05-03: 4 mg via INTRAVENOUS
  Filled 2016-05-03: qty 2

## 2016-05-03 MED ORDER — ONDANSETRON HCL 4 MG/2ML IJ SOLN
INTRAMUSCULAR | Status: DC | PRN
  Administered 2016-05-03: 4 mg via INTRAVENOUS

## 2016-05-03 MED ORDER — PROMETHAZINE HCL 25 MG/ML IJ SOLN: 6.2500 mg | INTRAMUSCULAR | Status: DC | PRN

## 2016-05-03 MED ORDER — WHITE PETROLATUM GEL
Status: DC | PRN
  Administered 2016-05-03: 22:00:00 via TOPICAL
  Filled 2016-05-03: qty 1

## 2016-05-03 MED ORDER — METOCLOPRAMIDE HCL 5 MG/ML IJ SOLN: 5.0000 mg | Freq: Three times a day (TID) | INTRAMUSCULAR | Status: DC | PRN

## 2016-05-03 MED ORDER — DEXAMETHASONE SODIUM PHOSPHATE 10 MG/ML IJ SOLN
INTRAMUSCULAR | Status: AC
  Filled 2016-05-03: qty 1

## 2016-05-03 MED ORDER — MORPHINE SULFATE (PF) 4 MG/ML IV SOLN
INTRAVENOUS | Status: AC
  Administered 2016-05-03: 2 mg via INTRAVENOUS
  Filled 2016-05-03: qty 1

## 2016-05-03 MED ORDER — BUPIVACAINE IN DEXTROSE 0.75-8.25 % IT SOLN
INTRATHECAL | Status: DC | PRN
  Administered 2016-05-03: 2 mL via INTRATHECAL

## 2016-05-03 MED ORDER — ONDANSETRON HCL 4 MG/2ML IJ SOLN
INTRAMUSCULAR | Status: AC
  Filled 2016-05-03: qty 2

## 2016-05-03 MED ORDER — HYDROMORPHONE HCL 2 MG/ML IJ SOLN
1.0000 mg | INTRAMUSCULAR | Status: DC | PRN
  Administered 2016-05-03: 1 mg via INTRAVENOUS
  Filled 2016-05-03 (×2): qty 1

## 2016-05-03 MED ORDER — DOCUSATE SODIUM 100 MG PO CAPS
100.0000 mg | ORAL_CAPSULE | Freq: Two times a day (BID) | ORAL | Status: DC
  Administered 2016-05-03 – 2016-05-04 (×3): 100 mg via ORAL
  Filled 2016-05-03 (×3): qty 1

## 2016-05-03 MED ORDER — MORPHINE SULFATE (PF) 4 MG/ML IV SOLN
2.0000 mg | INTRAVENOUS | Status: DC | PRN
  Administered 2016-05-03 (×2): 2 mg via INTRAVENOUS

## 2016-05-03 MED ORDER — MIDAZOLAM HCL 2 MG/2ML IJ SOLN
INTRAMUSCULAR | Status: AC
  Filled 2016-05-03: qty 2

## 2016-05-03 MED ORDER — ACETAMINOPHEN 325 MG PO TABS: 650.0000 mg | ORAL_TABLET | Freq: Four times a day (QID) | ORAL | Status: DC | PRN

## 2016-05-03 MED ORDER — HYDROMORPHONE HCL 1 MG/ML IJ SOLN
0.2500 mg | INTRAMUSCULAR | Status: DC | PRN
  Administered 2016-05-03 (×4): 0.5 mg via INTRAVENOUS

## 2016-05-03 MED ORDER — MIDAZOLAM HCL 5 MG/5ML IJ SOLN
INTRAMUSCULAR | Status: DC | PRN
  Administered 2016-05-03 (×2): 1 mg via INTRAVENOUS

## 2016-05-03 MED ORDER — OXYCODONE HCL ER 20 MG PO T12A
20.0000 mg | EXTENDED_RELEASE_TABLET | Freq: Two times a day (BID) | ORAL | Status: DC
  Administered 2016-05-03 – 2016-05-04 (×3): 20 mg via ORAL
  Filled 2016-05-03 (×3): qty 1

## 2016-05-03 MED ORDER — HYDROMORPHONE HCL 2 MG/ML IJ SOLN
INTRAMUSCULAR | Status: AC
  Filled 2016-05-03: qty 1

## 2016-05-03 MED ORDER — SODIUM CHLORIDE 0.9 % IR SOLN
Status: DC | PRN
  Administered 2016-05-03: 3000 mL
  Administered 2016-05-03: 1000 mL

## 2016-05-03 MED ORDER — METOCLOPRAMIDE HCL 5 MG PO TABS: 5.0000 mg | ORAL_TABLET | Freq: Three times a day (TID) | ORAL | Status: DC | PRN

## 2016-05-03 MED ORDER — PHENYLEPHRINE HCL 10 MG/ML IJ SOLN
INTRAVENOUS | Status: DC | PRN
  Administered 2016-05-03: 20 ug/min via INTRAVENOUS

## 2016-05-03 MED ORDER — POVIDONE-IODINE 7.5 % EX SOLN: Freq: Once | CUTANEOUS | Status: DC

## 2016-05-03 SURGICAL SUPPLY — 71 items
BANDAGE ELASTIC 6 VELCRO ST LF (GAUZE/BANDAGES/DRESSINGS) ×3 IMPLANT
BANDAGE ESMARK 6X9 LF (GAUZE/BANDAGES/DRESSINGS) ×1 IMPLANT
BENZOIN TINCTURE PRP APPL 2/3 (GAUZE/BANDAGES/DRESSINGS) ×3 IMPLANT
BLADE SAGITTAL 25.0X1.19X90 (BLADE) ×2 IMPLANT
BLADE SAGITTAL 25.0X1.19X90MM (BLADE) ×1
BLADE SAW SGTL 13X75X1.27 (BLADE) ×3 IMPLANT
BLADE SURG 10 STRL SS (BLADE) ×6 IMPLANT
BNDG ELASTIC 6X15 VLCR STRL LF (GAUZE/BANDAGES/DRESSINGS) ×3 IMPLANT
BNDG ESMARK 6X9 LF (GAUZE/BANDAGES/DRESSINGS) ×3
BOWL SMART MIX CTS (DISPOSABLE) ×3 IMPLANT
CAPT KNEE TOTAL 3 ATTUNE ×3 IMPLANT
CEMENT HV SMART SET (Cement) ×6 IMPLANT
CLOSURE WOUND 1/2 X4 (GAUZE/BANDAGES/DRESSINGS) ×2
COVER SURGICAL LIGHT HANDLE (MISCELLANEOUS) ×3 IMPLANT
CUFF TOURNIQUET SINGLE 34IN LL (TOURNIQUET CUFF) ×3 IMPLANT
CUFF TOURNIQUET SINGLE 44IN (TOURNIQUET CUFF) ×3 IMPLANT
DECANTER SPIKE VIAL GLASS SM (MISCELLANEOUS) ×3 IMPLANT
DRAPE EXTREMITY T 121X128X90 (DRAPE) ×3 IMPLANT
DRAPE HALF SHEET 40X57 (DRAPES) ×3 IMPLANT
DRAPE INCISE IOBAN 66X45 STRL (DRAPES) ×6 IMPLANT
DRAPE PROXIMA HALF (DRAPES) ×3 IMPLANT
DRAPE U-SHAPE 47X51 STRL (DRAPES) ×3 IMPLANT
DRSG AQUACEL AG ADV 3.5X14 (GAUZE/BANDAGES/DRESSINGS) ×3 IMPLANT
DURAPREP 26ML APPLICATOR (WOUND CARE) ×6 IMPLANT
ELECT CAUTERY BLADE 6.4 (BLADE) ×3 IMPLANT
ELECT REM PT RETURN 9FT ADLT (ELECTROSURGICAL) ×3
ELECTRODE REM PT RTRN 9FT ADLT (ELECTROSURGICAL) ×1 IMPLANT
FACESHIELD WRAPAROUND (MASK) ×3 IMPLANT
GLOVE BIO SURGEON STRL SZ7 (GLOVE) ×18 IMPLANT
GLOVE BIOGEL PI IND STRL 7.0 (GLOVE) ×1 IMPLANT
GLOVE BIOGEL PI IND STRL 7.5 (GLOVE) ×1 IMPLANT
GLOVE BIOGEL PI INDICATOR 7.0 (GLOVE) ×2
GLOVE BIOGEL PI INDICATOR 7.5 (GLOVE) ×2
GLOVE SS BIOGEL STRL SZ 7.5 (GLOVE) ×4 IMPLANT
GLOVE SUPERSENSE BIOGEL SZ 7.5 (GLOVE) ×8
GOWN STRL REUS W/ TWL LRG LVL3 (GOWN DISPOSABLE) ×1 IMPLANT
GOWN STRL REUS W/ TWL XL LVL3 (GOWN DISPOSABLE) ×2 IMPLANT
GOWN STRL REUS W/TWL LRG LVL3 (GOWN DISPOSABLE) ×2
GOWN STRL REUS W/TWL XL LVL3 (GOWN DISPOSABLE) ×4
HANDPIECE INTERPULSE COAX TIP (DISPOSABLE) ×2
HOOD PEEL AWAY FACE SHEILD DIS (HOOD) ×6 IMPLANT
IMMOBILIZER KNEE 22 UNIV (SOFTGOODS) ×3 IMPLANT
KIT BASIN OR (CUSTOM PROCEDURE TRAY) ×3 IMPLANT
KIT ROOM TURNOVER OR (KITS) ×3 IMPLANT
MANIFOLD NEPTUNE II (INSTRUMENTS) ×3 IMPLANT
MARKER SKIN DUAL TIP RULER LAB (MISCELLANEOUS) ×3 IMPLANT
NEEDLE 18GX1X1/2 (RX/OR ONLY) (NEEDLE) ×3 IMPLANT
NS IRRIG 1000ML POUR BTL (IV SOLUTION) ×3 IMPLANT
PACK TOTAL JOINT (CUSTOM PROCEDURE TRAY) ×3 IMPLANT
PAD ARMBOARD 7.5X6 YLW CONV (MISCELLANEOUS) ×6 IMPLANT
SET HNDPC FAN SPRY TIP SCT (DISPOSABLE) ×1 IMPLANT
STRIP CLOSURE SKIN 1/2X4 (GAUZE/BANDAGES/DRESSINGS) ×4 IMPLANT
SUCTION FRAZIER HANDLE 10FR (MISCELLANEOUS) ×2
SUCTION TUBE FRAZIER 10FR DISP (MISCELLANEOUS) ×1 IMPLANT
SUT MNCRL AB 3-0 PS2 18 (SUTURE) ×3 IMPLANT
SUT VIC AB 0 CT1 27 (SUTURE) ×4
SUT VIC AB 0 CT1 27XBRD ANBCTR (SUTURE) ×2 IMPLANT
SUT VIC AB 1 CT1 27 (SUTURE) ×4
SUT VIC AB 1 CT1 27XBRD ANBCTR (SUTURE) ×2 IMPLANT
SUT VIC AB 2-0 CT1 27 (SUTURE) ×6
SUT VIC AB 2-0 CT1 TAPERPNT 27 (SUTURE) ×3 IMPLANT
SUT VIC AB 2-0 SH 27 (SUTURE) ×2
SUT VIC AB 2-0 SH 27X BRD (SUTURE) ×1 IMPLANT
SYR 30ML LL (SYRINGE) ×3 IMPLANT
TOWEL OR 17X24 6PK STRL BLUE (TOWEL DISPOSABLE) ×3 IMPLANT
TOWEL OR 17X26 10 PK STRL BLUE (TOWEL DISPOSABLE) ×3 IMPLANT
TRAY CATH 16FR W/PLASTIC CATH (SET/KITS/TRAYS/PACK) IMPLANT
TRAY FOLEY CATH 16FR SILVER (SET/KITS/TRAYS/PACK) ×3 IMPLANT
TUBE CONNECTING 12'X1/4 (SUCTIONS) ×1
TUBE CONNECTING 12X1/4 (SUCTIONS) ×2 IMPLANT
YANKAUER SUCT BULB TIP NO VENT (SUCTIONS) ×3 IMPLANT

## 2016-05-03 NOTE — Transfer of Care (Signed)
Immediate Anesthesia Transfer of Care Note  Patient: Marissa [name]  Procedure(s) Performed: Procedure(s): RIGHT TOTAL KNEE ARTHROPLASTY (Right)  Patient Location: PACU  Anesthesia Type:Spinal and MAC combined with regional for post-op pain  Level of Consciousness: awake, alert , oriented and patient cooperative  Airway & Oxygen Therapy: Patient Spontanous Breathing and Patient connected to nasal cannula oxygen  Post-op Assessment: Report given to RN, Post -op Vital signs reviewed and stable and Patient moving all extremities  Post vital signs: Reviewed and stable  Last Vitals:  Vitals:   05/03/16 0613 05/03/16 0954  BP: (!) 155/70 (!) 117/46  Pulse: (!) 56 (!) 57  Resp: 20 20  Temp: 36.9 C     Last Pain:  Vitals:   05/03/16 0613  TempSrc: Oral         Complications: No apparent anesthesia complications

## 2016-05-03 NOTE — Anesthesia Postprocedure Evaluation (Signed)
Anesthesia Post Note  Patient: SOREN LANGWORTHY  Procedure(s) Performed: Procedure(s) (LRB): RIGHT TOTAL KNEE ARTHROPLASTY (Right)  Patient location during evaluation: PACU Anesthesia Type: Spinal Level of consciousness: oriented and awake and alert Pain management: pain level controlled Vital Signs Assessment: post-procedure vital signs reviewed and stable Respiratory status: spontaneous breathing, respiratory function stable and patient connected to nasal cannula oxygen Cardiovascular status: blood pressure returned to baseline and stable Postop Assessment: no headache and no backache Anesthetic complications: no    Last Vitals:  Vitals:   05/03/16 1009 05/03/16 1024  BP: 128/68 123/64  Pulse: (!) 57 61  Resp: 11 11  Temp:      Last Pain:  Vitals:   05/03/16 1020  TempSrc:   PainSc: 5     LLE Motor Response: Responds to commands (05/03/16 1020)   RLE Motor Response: Responds to commands (05/03/16 1020)   L Sensory Level: S1-Sole of foot, small toes (05/03/16 1020) R Sensory Level: S1-Sole of foot, small toes (05/03/16 1020)  Shaquala Broeker S

## 2016-05-03 NOTE — Anesthesia Procedure Notes (Signed)
Spinal  Patient location during procedure: OR Staffing Anesthesiologist: Jyasia Markoff, [place] Performed: anesthesiologist  Preanesthetic Checklist Completed: patient identified, site marked, surgical consent, pre-op evaluation, timeout performed, IV checked, risks and benefits discussed and monitors and equipment checked Spinal Block Patient position: sitting Prep: Betadine Patient monitoring: heart rate, continuous pulse ox and blood pressure Injection technique: single-shot Needle Needle type: Sprotte  Needle gauge: 24 G Needle length: 9 cm Additional Notes Expiration date of kit checked and confirmed. Patient tolerated procedure well, without complications.

## 2016-05-03 NOTE — Anesthesia Preprocedure Evaluation (Addendum)
Anesthesia Evaluation  Patient identified by MRN, date of birth, ID band Patient awake    Reviewed: Allergy & Precautions, NPO status , Patient's Chart, lab work & pertinent test results  Airway Mallampati: II  TM Distance: >3 FB Neck ROM: Full    Dental no notable dental hx. (+) Teeth Intact, Dental Advisory Given   Pulmonary sleep apnea ,  Uses mouth guard at night for OSA   Pulmonary exam normal breath sounds clear to auscultation       Cardiovascular negative cardio ROS Normal cardiovascular exam Rhythm:Regular Rate:Normal     Neuro/Psych negative neurological ROS  negative psych ROS   GI/Hepatic negative GI ROS, Neg liver ROS, GERD  Medicated and Controlled,  Endo/Other  Morbid obesity  Renal/GU negative Renal ROS  negative genitourinary   Musculoskeletal negative musculoskeletal ROS (+) Arthritis ,   Abdominal   Peds negative pediatric ROS (+)  Hematology negative hematology ROS (+)   Anesthesia Other Findings   Reproductive/Obstetrics negative OB ROS                            Anesthesia Physical Anesthesia Plan  ASA: III  Anesthesia Plan: General   Post-op Pain Management: GA combined w/ Regional for post-op pain   Induction: Intravenous  Airway Management Planned: Oral ETT  Additional Equipment:   Intra-op Plan:   Post-operative Plan: Extubation in OR  Informed Consent: I have reviewed the patients History and Physical, chart, labs and discussed the procedure including the risks, benefits and alternatives for the proposed anesthesia with the patient or authorized representative who has indicated his/her understanding and acceptance.   Dental advisory given  Plan Discussed with: CRNA and Surgeon  Anesthesia Plan Comments:         Anesthesia Quick Evaluation

## 2016-05-03 NOTE — Evaluation (Signed)
Physical Therapy Evaluation Patient Details Name: Marissa [name] MRN: [id number] DOB: Nov 14, 1960 Today's Date: 05/03/2016   History of Present Illness  55 yo admitted for right TKA. PMHx: OSA, HLD  Clinical Impression  Pt very pleasant and moving well POD #0. Pt educated for knee precaution, transfers, gait , DME use, heel roll (no bone foam available), and progression. Pt with decreased strength, ROM, transfers and ambulation who will benefit from acute therapy to maximize function of RLE and gait to return pt to PLOF.     Follow Up Recommendations Home health PT    Equipment Recommendations  None recommended by PT    Recommendations for Other Services OT consult     Precautions / Restrictions Precautions Precautions: Knee Required Braces or Orthoses: Knee Immobilizer - Right Restrictions Weight Bearing Restrictions: Yes RLE Weight Bearing: Weight bearing as tolerated      Mobility  Bed Mobility Overal bed mobility: Modified Independent                Transfers Overall transfer level: Needs assistance   Transfers: Sit to/from Stand Sit to Stand: Min guard         General transfer comment: cues for hand placement and safety  Ambulation/Gait Ambulation/Gait assistance: Min guard Ambulation Distance (Feet): 80 Feet Assistive device: Rolling walker (2 wheeled) Gait Pattern/deviations: Step-to pattern;Decreased stance time - right   Gait velocity interpretation: Below normal speed for age/gender General Gait Details: cues for posture, sequence and looking up  Stairs            Wheelchair Mobility    Modified Rankin (Stroke Patients Only)       Balance                                             Pertinent Vitals/Pain Pain Assessment: 0-10 Pain Score: 5  Pain Location: right knee, 3/10 at rest, 5/10 with gait Pain Descriptors / Indicators: Aching;Throbbing;Sore Pain Intervention(s): Limited activity within patient's  tolerance;Premedicated before session;Monitored during session;Repositioned;Ice applied    Home Living Family/patient expects to be discharged to:: Private residence Living Arrangements: Spouse/significant other Available Help at Discharge: Family;Available 24 hours/day Type of Home: House Home Access: Stairs to enter   [employer] of Steps: 8 Home Layout: Two level;Bed/bath upstairs Home Equipment: Walker - 2 wheels;Bedside commode;Shower seat      Prior Function Level of Independence: Independent               Hand Dominance        Extremity/Trunk Assessment   Upper Extremity Assessment: Overall WFL for tasks assessed           Lower Extremity Assessment: RLE deficits/detail RLE Deficits / Details: decreased ROM and strength as expected post op    Cervical / Trunk Assessment: Normal  Communication   Communication: No difficulties  Cognition Arousal/Alertness: Awake/alert Behavior During Therapy: WFL for tasks assessed/performed Overall Cognitive Status: Within Functional Limits for tasks assessed                      General Comments      Exercises Total Joint Exercises Quad Sets: AROM;Right;5 reps;Supine Heel Slides: AROM;Right;5 reps;Supine Hip ABduction/ADduction: AROM;Right;5 reps;Supine Straight Leg Raises: AROM;Right;5 reps;Supine   Assessment/Plan    PT Assessment Patient needs continued PT services  PT Problem List Decreased strength;Decreased mobility;Decreased range of motion;Decreased activity  tolerance;Decreased knowledge of use of DME;Pain          PT Treatment Interventions Functional mobility training;Stair training;Gait training;Therapeutic exercise;Patient/family education;DME instruction;Therapeutic activities    PT Goals (Current goals can be found in the Care Plan section)  Acute Rehab PT Goals Patient Stated Goal: return to normal life, scuba diving PT Goal Formulation: With patient/family Time For Goal  Achievement: 05/10/16 Potential to Achieve Goals: Good    Frequency 7X/week   Barriers to discharge        Co-evaluation               End of Session Equipment Utilized During Treatment: Gait belt;Right knee immobilizer Activity Tolerance: Patient tolerated treatment well Patient left: in chair;with call bell/phone within reach;with family/visitor present Nurse Communication: Mobility status         Time: [id number] PT Time Calculation (min) (ACUTE ONLY): 29 min   Charges:   PT Evaluation $PT Eval Moderate Complexity: 1 Procedure PT Treatments $Therapeutic Activity: 8-22 mins   PT G CodesMelford Aase 05/03/2016, 2:33 PM Elwyn Reach, [place]

## 2016-05-03 NOTE — Op Note (Signed)
MRN:     [id number] DOB/AGE:    December 26, 1960 / 55 y.o.       OPERATIVE REPORT    DATE OF PROCEDURE:  05/03/2016       PREOPERATIVE DIAGNOSIS:   Post traumatic localized OA right knee      Estimated body mass index is 43.08 kg/m as calculated from the following:   Height as of 04/23/16: 5\' 4"  (1.626 m).   Weight as of this encounter: 113.9 kg (251 lb).                                                        POSTOPERATIVE DIAGNOSIS:   same                                                                     PROCEDURE:  Procedure(s): RIGHT TOTAL KNEE ARTHROPLASTY Using Depuy Attune RP implants #5 narrow Femur, #4Tibia, 59mm  RP bearing, 29 Patella     SURGEON: Taevion Sikora A    ASSISTANT:  Kirstin Shepperson PA-C   (Present and scrubbed throughout the case, critical for assistance with exposure, retraction, instrumentation, and closure.)         ANESTHESIA: Spinal with Adductor Nerve Block     TOURNIQUET TIME: [id number]   COMPLICATIONS:  None     SPECIMENS: None   INDICATIONS FOR PROCEDURE: The patient has  djd right knee, varus deformities, XR shows bone on bone arthritis. Patient has failed all conservative measures including anti-inflammatory medicines, narcotics, attempts at  exercise and weight loss, cortisone injections and viscosupplementation.  Risks and benefits of surgery have been discussed, questions answered.   DESCRIPTION OF PROCEDURE: The patient identified by armband, received  right femoral nerve block and IV antibiotics, in the holding area at [facility]. Patient taken to the operating room, appropriate anesthetic  monitors were attached spinal anesthesia induced with  the patient in supine position, Foley catheter was inserted. Tourniquet  applied high to the operative thigh. Lateral post and foot positioner  applied to the table, the lower extremity was then prepped and draped  in usual sterile fashion from the ankle to the tourniquet. Time-out procedure was  performed. The limb was wrapped with an Esmarch bandage and the tourniquet inflated to 365 mmHg. We began the operation by making the anterior midline incision starting at handbreadth above the patella going over the patella 1 cm medial to and  4 cm distal to the tibial tubercle. Small bleeders in the skin and the  subcutaneous tissue identified and cauterized. Transverse retinaculum was incised and reflected medially and a medial parapatellar arthrotomy was accomplished. the patella was everted and theprepatellar fat pad resected. The superficial medial collateral  ligament was then elevated from anterior to posterior along the proximal  flare of the tibia and anterior half of the menisci resected. The knee was hyperflexed exposing bone on bone arthritis. Peripheral and notch osteophytes as well as the cruciate ligaments were then resected. We continued to  work our way around posteriorly along the proximal tibia, and externally  rotated the tibia subluxing it  out from underneath the femur. A McHale  retractor was placed through the notch and a lateral Hohmann retractor  placed, and we then drilled through the proximal tibia in line with the  axis of the tibia followed by an intramedullary guide rod and 2-degree  posterior slope cutting guide. The tibial cutting guide was pinned into place  allowing resection of 6 mm of bone medially and about 4 mm of bone  laterally because of her valgus deformity. Satisfied with the tibial resection, we then  entered the distal femur 2 mm anterior to the PCL origin with the  intramedullary guide rod and applied the distal femoral cutting guide  set at 8mm, with 5 degrees of valgus. This was pinned along the  epicondylar axis. At this point, the distal femoral cut was accomplished without difficulty. We then sized for a #5 narrow femoral component and pinned the guide in 3 degrees of external rotation.The chamfer cutting guide was pinned into place. The anterior,  posterior, and chamfer cuts were accomplished without difficulty followed by  the  RP box cutting guide and the box cut. We also removed posterior osteophytes from the posterior femoral condyles. At this  time, the knee was brought into full extension. We checked our  extension and flexion gaps and found them symmetric at 67mm.  The patella thickness measured at 21 mm. We set the cutting guide at 13 and removed the posterior 8 mm  of the patella sized for 29 button and drilled the lollipop. The knee  was then once again hyperflexed exposing the proximal tibia. We sized for a #4 tibial base plate, applied the smokestack and the conical reamer followed by the the Delta fin keel punch. We then hammered into place the  RP trial femoral component, inserted a 1 trial bearing, trial patellar button, and took the knee through range of motion from 0-130 degrees. No thumb pressure was required for patellar  tracking. At this point, all trial components were removed, a double batch of DePuy HV cement was mixed and applied to all bony metallic mating surfaces except for the posterior condyles of the femur itself. In order, we  hammered into place the tibial tray and removed excess cement, the femoral component and removed excess cement, a 42mm  RP bearing  was inserted, and the knee brought to full extension with compression.  The patellar button was clamped into place, and excess cement  removed. While the cement cured the wound was irrigated out with normal saline solution pulse lavage.. Ligament stability and patellar tracking were checked and found to be excellent.. The parapatellar arthrotomy was closed with  #1 Vicryl suture. The subcutaneous tissue with 0 and 2-0 undyed  Vicryl suture, and 4-0 Monocryl.. A dressing of Aquaseal,  4 x 4, dressing sponges, Webril, and Ace wrap applied. Needle and sponge count were correct times 2.The patient awakened, extubated, and taken to recovery room without difficulty.  Vascular status was normal, pulses 2+ and symmetric.   Keyana Guevara A 05/03/2016, 9:21 AM

## 2016-05-03 NOTE — Progress Notes (Signed)
Orthopedic Tech Progress Note Patient Details:  Marissa [name] [birth year] [id number]  CPM Right Knee CPM Right Knee: On Right Knee Flexion (Degrees): 90 Right Knee Extension (Degrees): 0 Additional Comments: Trapeze bar   [name] 05/03/2016, 12:20 PM

## 2016-05-04 ENCOUNTER — Encounter (HOSPITAL_COMMUNITY): Payer: Self-pay | Admitting: Orthopedic Surgery

## 2016-05-04 LAB — BASIC METABOLIC PANEL
ANION GAP: 12 (ref 5–15)
BUN: 9 mg/dL (ref 6–20)
CALCIUM: 8.8 mg/dL — AB (ref 8.9–10.3)
CO2: 21 mmol/L — ABNORMAL LOW (ref 22–32)
CREATININE: 0.95 mg/dL (ref 0.44–1.00)
Chloride: 102 mmol/L (ref 101–111)
Glucose, Bld: 193 mg/dL — ABNORMAL HIGH (ref 65–99)
Potassium: 4.5 mmol/L (ref 3.5–5.1)
Sodium: 135 mmol/L (ref 135–145)

## 2016-05-04 LAB — CBC
HCT: 36.3 % (ref 36.0–46.0)
Hemoglobin: 12.6 g/dL (ref 12.0–15.0)
MCH: 32.6 pg (ref 26.0–34.0)
MCHC: 34.7 g/dL (ref 30.0–36.0)
MCV: 94 fL (ref 78.0–100.0)
PLATELETS: 322 10*3/uL (ref 150–400)
RBC: 3.86 MIL/uL — ABNORMAL LOW (ref 3.87–5.11)
RDW: 12.1 % (ref 11.5–15.5)
WBC: 19.9 10*3/uL — AB (ref 4.0–10.5)

## 2016-05-04 MED ORDER — APIXABAN 2.5 MG PO TABS: ORAL_TABLET | ORAL | 0 refills | Status: DC

## 2016-05-04 MED ORDER — OXYCODONE HCL 5 MG PO TABS: ORAL_TABLET | ORAL | 0 refills | Status: DC

## 2016-05-04 MED ORDER — POLYETHYLENE GLYCOL 3350 17 G PO PACK: PACK | ORAL | 0 refills | Status: DC

## 2016-05-04 MED ORDER — OXYCODONE HCL ER 20 MG PO T12A: 20.0000 mg | EXTENDED_RELEASE_TABLET | Freq: Two times a day (BID) | ORAL | 0 refills | Status: DC

## 2016-05-04 MED ORDER — ACETAMINOPHEN 325 MG PO TABS: 650.0000 mg | ORAL_TABLET | Freq: Four times a day (QID) | ORAL | Status: DC | PRN

## 2016-05-04 MED ORDER — DOCUSATE SODIUM 100 MG PO CAPS: ORAL_CAPSULE | ORAL | 0 refills | Status: DC

## 2016-05-04 NOTE — Discharge Summary (Signed)
Patient ID: DAIRY WISSER MRN: [id number] DOB/AGE: March 02, 1961 55 y.o.  Admit date: 05/03/2016 Discharge date: 05/04/2016  Admission Diagnoses:  Principal Problem:   Unilateral post-traumatic osteoarthritis, right knee Active Problems:   Hyperlipidemia   GERD   Diaphragmatic hernia   Obstructive sleep apnea   Intrinsic asthma   Primary localized osteoarthrosis of the knee, right   Post-traumatic osteoarthritis of right knee   Discharge Diagnoses:  Same  Past Medical History:  Diagnosis Date  . Abnormal uterine bleeding    due to fibroids  . Anxiety   . Arthritis    knee, scheduled for knee replacement 04/2016  . Asthma    exercise induced-no inhalers  . Dysmenorrhea    2000  from fibroids  . Family history of adverse reaction to anesthesia 02/26/2016   father had shoulder replacement-admitted back to hospital for related complications  . Fibroid   . Genital warts 1994   Tx  x2  . GERD (gastroesophageal reflux disease)   . Head injury with loss of consciousness ([place]) 1986  . Hyperlipidemia   . Primary localized osteoarthrosis of the knee, right   . Sleep apnea    has a cpap-cannot use-makes her sick  . Unilateral post-traumatic osteoarthritis, right knee 04/21/2016    Surgeries: Procedure(s): RIGHT TOTAL KNEE ARTHROPLASTY on 05/03/2016   Consultants:   Discharged Condition: Improved  Hospital Course: Marissa [name] is an 55 y.o. female who was admitted 05/03/2016 for operative treatment ofUnilateral post-traumatic osteoarthritis, right knee. Patient has severe unremitting pain that affects sleep, daily activities, and work/hobbies. After pre-op clearance the patient was taken to the operating room on 05/03/2016 and underwent  Procedure(s): RIGHT TOTAL KNEE ARTHROPLASTY.    Patient was given perioperative antibiotics: Anti-infectives    Start     Dose/Rate Route Frequency Ordered Stop   05/04/16 0800  cefUROXime (CEFTIN) tablet 500 mg     500 mg Oral 2  times daily with meals 05/03/16 1114     05/03/16 1330  ceFAZolin (ANCEF) IVPB 2g/100 mL premix     2 g 200 mL/hr over 30 Minutes Intravenous Every 6 hours 05/03/16 1114 05/03/16 2029   05/03/16 0600  ceFAZolin (ANCEF) 3 g in dextrose 5 % 50 mL IVPB  Status:  Discontinued     3 g 130 mL/hr over 30 Minutes Intravenous On call to O.R. 05/02/16 1600 05/02/16 2334   05/03/16 0600  ceFAZolin (ANCEF) IVPB 2g/100 mL premix     2 g 200 mL/hr over 30 Minutes Intravenous  Once 05/02/16 2335 05/03/16 0740       Patient was given sequential compression devices, early ambulation, and chemoprophylaxis to prevent DVT.  Patient benefited maximally from hospital stay and there were no complications.    Recent vital signs: Patient Vitals for the past 24 hrs:  BP Temp Temp src Pulse Resp SpO2  05/04/16 0400 (!) 121/52 98.4 F (36.9 C) Oral 70 17 97 %  05/04/16 0030 114/70 98.4 F (36.9 C) Oral 69 17 98 %  05/03/16 2026 140/66 98 F (36.7 C) Oral (!) 58 18 98 %  05/03/16 1425 (!) 145/81 97.5 F (36.4 C) - 62 16 96 %  05/03/16 1054 (!) 160/94 - - 62 11 95 %  05/03/16 1050 - 97 F (36.1 C) - - - -  05/03/16 1043 (!) 152/81 - - 62 12 100 %  05/03/16 1024 123/64 - - 61 11 94 %  05/03/16 1009 128/68 - - (!) 57 11 100 %  05/03/16 0954 (!) 117/46 - - (!) 57 20 100 %  05/03/16 0952 - 97.4 F (36.3 C) - (!) 59 12 100 %     Recent laboratory studies:  Recent Labs  05/04/16 0434  WBC 19.9*  HGB 12.6  HCT 36.3  PLT 322  NA 135  K 4.5  CL 102  CO2 21*  BUN 9  CREATININE 0.95  GLUCOSE 193*  CALCIUM 8.8*     Discharge Medications:     Medication List    STOP taking these medications   ibuprofen 800 MG tablet Commonly known as:  ADVIL,MOTRIN   naproxen sodium 220 MG tablet Commonly known as:  ANAPROX     TAKE these medications   acetaminophen 325 MG tablet Commonly known as:  TYLENOL Take 2 tablets (650 mg total) by mouth every 6 (six) hours as needed for mild pain (or Fever >/=  101).   apixaban 2.5 MG Tabs tablet Commonly known as:  ELIQUIS 1 tablet every 12 hrs to prevent blood clots   cefUROXime 500 MG tablet Commonly known as:  CEFTIN Take 500 mg by mouth 2 (two) times daily with a meal.   docusate sodium 100 MG capsule Commonly known as:  COLACE 1 tab 2 times a day while on narcotics.  STOOL SOFTENER   omeprazole-sodium bicarbonate 40-1100 MG capsule Commonly known as:  ZEGERID Take 1 capsule by mouth daily as needed (acid reflux).   oxyCODONE 5 MG immediate release tablet Commonly known as:  Oxy IR/ROXICODONE 1-2 tablets every 4-6 hrs as needed for breakthrough pain.  SHORT ACTING PAIN MEDICATION   oxyCODONE 20 mg 12 hr tablet Commonly known as:  OXYCONTIN Take 1 tablet (20 mg total) by mouth every 12 (twelve) hours.   polyethylene glycol packet Commonly known as:  MIRALAX / GLYCOLAX 17grams in 6 oz of water twice a day until bowel movement.  LAXITIVE.  Restart if two days since last bowel movement What changed:  how much to take  how to take this  when to take this  reasons to take this  additional instructions   PROBIOTIC ACIDOPHILUS Caps Take 1 capsule by mouth daily.       Diagnostic Studies: Dg Chest 2 View  Result Date: 04/23/2016 CLINICAL DATA:  Preoperative examination prior to right knee surgery. No cardiopulmonary abnormalities. Nonsmoker. EXAM: CHEST  2 VIEW COMPARISON:  Chest x-ray of Nov 11, 2015 FINDINGS: The lungs are adequately inflated. There is no focal infiltrate. There is no pleural effusion. The heart and pulmonary vascularity are normal. The mediastinum is normal in width. The bony thorax exhibits no acute abnormality. IMPRESSION: There is no active cardiopulmonary disease. Electronically Signed   By: David  Martinique M.D.   On: 04/23/2016 13:06    Disposition: 01-Home or Self Care    Follow-up Information    Lorn Junes, MD Follow up on 05/17/2016.   Specialty:  Orthopedic Surgery Why:  APPT TIME AT  4:30 PM Contact information: [address] [place] [address] [phone]            Signed: Linda Hedges 05/04/2016, 8:54 AM

## 2016-05-04 NOTE — Evaluation (Addendum)
Occupational Therapy Evaluation/Discharge Patient Details Name: Marissa [name] MRN: [id number] DOB: 02-28-1961 Today's Date: 05/04/2016    History of Present Illness 55 yo admitted for right TKA. PMHx: OSA, HLD   Clinical Impression    PTA, pt was independent with ADL and IADL. Pt currently requires min assist for LB ADL, min guard assist for tub transfer, and supervision for functional mobility. Husband present and engaged in session and able to provide appropriate assistance.  Pt and husband educated on dressing techniques, safe tub transfer, safe use of DME, fall prevention, and energy conservation in preparation for safe D/C home. Pt plans to D/C home this afternoon with husband who is able to provide 24 hour assistance.  Pt and husband report no further questions/concerns. Pt has no further acute OT needs. No OT follow-up recommended. Pt has all DME needs met.        Follow Up Recommendations  No OT follow up;Supervision/Assistance - 24 hour    Equipment Recommendations  None recommended by OT       Precautions / Restrictions Precautions Precautions: Knee Precaution Booklet Issued: No Precaution Comments: pt has good understanding of precautions Restrictions Weight Bearing Restrictions: Yes RLE Weight Bearing: Weight bearing as tolerated      Mobility Bed Mobility Overal bed mobility: Modified Independent             General bed mobility comments: increased time and effort; cues for technique when returning to supine  Transfers Overall transfer level: Needs assistance Equipment used: Rolling walker (2 wheeled) Transfers: Sit to/from Stand Sit to Stand: Supervision         General transfer comment: cues for safe hand placement    Balance Overall balance assessment: Needs assistance Sitting-balance support: No upper extremity supported;Feet supported Sitting balance-Leahy Scale: Good     Standing balance support: Single extremity supported;During  functional activity Standing balance-Leahy Scale: Fair                              ADL Overall ADL's : Needs assistance/impaired     Grooming: Set up;Sitting   Upper Body Bathing: Set up;Sitting   Lower Body Bathing: Minimal assistance;Sit to/from stand   Upper Body Dressing : Set up;Sitting   Lower Body Dressing: Minimal assistance;Sit to/from stand   Toilet Transfer: Supervision/safety;RW;Ambulation   Toileting- Water quality scientist and Hygiene: Supervision/safety;Sit to/from stand   Tub/ Shower Transfer: Min guard;Shower seat;Rolling walker;Ambulation   Functional mobility during ADLs: Supervision/safety;Rolling walker General ADL Comments: Pt and husband educated on knee precautions during ADL, safe shower transfers and dressing techniques.     Vision Vision Assessment?: No apparent visual deficits          Pertinent Vitals/Pain Pain Assessment: 0-10 Pain Score: 3  Faces Pain Scale: Hurts little more Pain Location: R knee Pain Descriptors / Indicators: Grimacing;Guarding;Operative site guarding Pain Intervention(s): Limited activity within patient's tolerance;Monitored during session     Hand Dominance Right   Extremity/Trunk Assessment Upper Extremity Assessment Upper Extremity Assessment: Overall WFL for tasks assessed   Lower Extremity Assessment Lower Extremity Assessment: RLE deficits/detail RLE Deficits / Details: decreased ROM and strength as expected post op       Communication Communication Communication: No difficulties   Cognition Arousal/Alertness: Awake/alert Behavior During Therapy: WFL for tasks assessed/performed Overall Cognitive Status: Within Functional Limits for tasks assessed  Home Living Family/patient expects to be discharged to:: Private residence Living Arrangements: Spouse/significant other Available Help at Discharge: Family;Available 24 hours/day Type of Home:  House Home Access: Stairs to enter [employer] of Steps: 8   Home Layout: Two level;Bed/bath upstairs     Bathroom Shower/Tub: Tub/shower unit Shower/tub characteristics: Door Biochemist, clinical: Standard     Home Equipment: Environmental consultant - 2 wheels;Bedside commode;Shower seat          Prior Functioning/Environment Level of Independence: Independent                 OT Problem List: Decreased strength;Decreased range of motion;Decreased activity tolerance;Impaired balance (sitting and/or standing);Decreased safety awareness;Decreased knowledge of use of DME or AE;Decreased knowledge of precautions;Pain   OT Treatment/Interventions:      OT Goals(Current goals can be found in the care plan section) Acute Rehab OT Goals Patient Stated Goal: return to normal life, scuba diving OT Goal Formulation: With patient Time For Goal Achievement: 05/18/16 Potential to Achieve Goals: Good   End of Session Equipment Utilized During Treatment: Rolling walker;Gait belt Nurse Communication: Other (comment) (Pt wanting to cover IV so she can shower)  Activity Tolerance: Patient tolerated treatment well Patient left: in chair;with call bell/phone within reach;with family/visitor present   Time: 8483-5075 OT Time Calculation (min): 24 min Charges:  OT General Charges $OT Visit: 1 Procedure OT Evaluation $OT Eval Moderate Complexity: 1 Procedure OT Treatments $Self Care/Home Management : 8-22 mins  Marissa [name] A Marissa [name] 05/04/2016, 3:06 PM

## 2016-05-04 NOTE — Care Management Note (Signed)
Case Management Note  Patient Details  Name: [name] MRN: [id number] Date of Birth: 1961-01-16  Subjective/Objective:  55 yr old female s/p right total knee arthroplasty.                   Action/Plan:  Case manager spoke with patient concerning discharge plan. Patient is under worker's comp. Her Adjuster is Lincoln Maxin [phone], Fax [phone]. Patient was setup with [facility] by worker's comp. She will have family support at discharge. Rolling walker, 3in1 and CPM  have been delivered to her home.    Expected Discharge Date: 05/04/16                 Expected Discharge Plan:  [place]  In-House Referral:  NA  Discharge planning Services  CM Consult  Post Acute Care Choice:  Home Health Choice offered to:  Patient (Patient has been setup by Worker's comp)  DME Arranged:  N/A DME Agency:  NA  HH Arranged:  PT [place] Agency:  [facility] ([place] was arranged by [employer] comp.)  Status of Service:  Completed, signed off  If discussed at [employer], dates discussed:    Additional Comments:  Ninfa Meeker, RN 05/04/2016, 12:39 PM

## 2016-05-04 NOTE — Progress Notes (Signed)
Physical Therapy Treatment Patient Details Name: Marissa [name] MRN: [id number] DOB: [birth year] Today's Date: 05/04/2016    History of Present Illness 55 yo admitted for right TKA. PMHx: OSA, HLD    PT Comments    Patient is progressing well toward mobility goals. Tolerated stair training and increased gait distance this session. Continue to progress as tolerated with anticipated d/c home with HHPT.   Follow Up Recommendations  Home health PT     Equipment Recommendations  None recommended by PT    Recommendations for Other Services OT consult     Precautions / Restrictions Precautions Precautions: Knee Precaution Comments: pt has good understanding of precautions Restrictions Weight Bearing Restrictions: Yes RLE Weight Bearing: Weight bearing as tolerated    Mobility  Bed Mobility Overal bed mobility: Modified Independent             General bed mobility comments: increased time and effort  Transfers Overall transfer level: Needs assistance Equipment used: Rolling walker (2 wheeled) Transfers: Sit to/from Stand Sit to Stand: Supervision         General transfer comment: cues for hand placement especially for safe descent  Ambulation/Gait Ambulation/Gait assistance: Supervision Ambulation Distance (Feet): 120 Feet Assistive device: Rolling walker (2 wheeled) Gait Pattern/deviations: Step-through pattern;Decreased stance time - right;Decreased step length - left;Decreased weight shift to right;Antalgic     General Gait Details: cues for posture, sequencing, and R heel strike/knee extension during stance phase; pt with improved step through gait and WB R LE with increased distance   Stairs Stairs: Yes Stairs assistance: Min guard Stair Management: One rail Right;Sideways;Step to pattern Number of Stairs: 6 General stair comments: cues for sequencing and technique; min guard for safety  Wheelchair Mobility    Modified Rankin (Stroke Patients  Only)       Balance                                    Cognition Arousal/Alertness: Awake/alert Behavior During Therapy: WFL for tasks assessed/performed Overall Cognitive Status: Within Functional Limits for tasks assessed                      Exercises Total Joint Exercises Heel Slides: AROM;Right;10 reps;Seated Goniometric ROM: 8-60    General Comments        Pertinent Vitals/Pain Pain Assessment: 0-10 Pain Score: 6  Pain Location: R knee Pain Descriptors / Indicators: Grimacing;Guarding;Sore Pain Intervention(s): Limited activity within patient's tolerance;Monitored during session;Premedicated before session;Repositioned    Home Living                      Prior Function            PT Goals (current goals can now be found in the care plan section) Acute Rehab PT Goals Patient Stated Goal: return to normal life, scuba diving Progress towards PT goals: Progressing toward goals    Frequency    7X/week      PT Plan Current plan remains appropriate    Co-evaluation             End of Session Equipment Utilized During Treatment: Gait belt Activity Tolerance: Patient tolerated treatment well Patient left: in chair;with call bell/phone within reach;with family/visitor present     Time: [id number] PT Time Calculation (min) (ACUTE ONLY): 41 min  Charges:  $Gait Training: 23-37 mins $Therapeutic Exercise: 8-22 mins  G Codes:      [place] April, PTA Pager: [phone]   05/04/2016, 9:25 AM

## 2016-05-04 NOTE — Progress Notes (Signed)
Physical Therapy Treatment Patient Details Name: Marissa [name] MRN: [id number] DOB: 08-Mar-1961 Today's Date: 05/04/2016    History of Present Illness 55 yo admitted for right TKA. PMHx: OSA, HLD    PT Comments    Patient is making good progress with PT.  From a mobility standpoint anticipate patient will be ready for DC home when medically ready.     Follow Up Recommendations  Home health PT     Equipment Recommendations  None recommended by PT    Recommendations for Other Services OT consult     Precautions / Restrictions Precautions Precautions: Knee Precaution Comments: pt has good understanding of precautions Restrictions Weight Bearing Restrictions: Yes RLE Weight Bearing: Weight bearing as tolerated    Mobility  Bed Mobility Overal bed mobility: Modified Independent             General bed mobility comments: increased time and effort; cues for technique when returning to supine  Transfers Overall transfer level: Needs assistance Equipment used: Rolling walker (2 wheeled) Transfers: Sit to/from Stand Sit to Stand: Supervision         General transfer comment: cues for safe hand placement  Ambulation/Gait Ambulation/Gait assistance: Supervision Ambulation Distance (Feet): 150 Feet Assistive device: Rolling walker (2 wheeled) Gait Pattern/deviations: Step-through pattern;Decreased stance time - right;Decreased step length - left;Decreased weight shift to right;Antalgic     General Gait Details: cues for posture and increased R knee flexion during swing phase; improved step length symmetry    Stairs Stairs: Yes Stairs assistance: Min guard Stair Management: One rail Left;Sideways Number of Stairs: 10 General stair comments: carry over of sequencing and technique; good safety awareness  Wheelchair Mobility    Modified Rankin (Stroke Patients Only)       Balance                                    Cognition  Arousal/Alertness: Awake/alert Behavior During Therapy: WFL for tasks assessed/performed Overall Cognitive Status: Within Functional Limits for tasks assessed                      Exercises Total Joint Exercises Knee Flexion: AROM;Right;5 reps;Seated    General Comments        Pertinent Vitals/Pain Pain Assessment: Faces Faces Pain Scale: Hurts little more Pain Location: R knee Pain Descriptors / Indicators: Grimacing;Guarding;Sore Pain Intervention(s): Limited activity within patient's tolerance;Monitored during session;Repositioned;Premedicated before session;Ice applied    Home Living                      Prior Function            PT Goals (current goals can now be found in the care plan section) Acute Rehab PT Goals Patient Stated Goal: return to normal life, scuba diving Progress towards PT goals: Progressing toward goals    Frequency    7X/week      PT Plan Current plan remains appropriate    Co-evaluation             End of Session Equipment Utilized During Treatment: Gait belt Activity Tolerance: Patient tolerated treatment well Patient left: in chair;with call bell/phone within reach;with family/visitor present     Time: [id number] PT Time Calculation (min) (ACUTE ONLY): 26 min  Charges:  $Gait Training: 8-22 mins $Therapeutic Activity: 8-22 mins  G Codes:      Salina April, PTA Pager: [phone]   05/04/2016, 2:05 PM

## 2016-05-04 NOTE — Progress Notes (Signed)
Patient discharged to home, discharge instuctions given, patient stated she understood

## 2016-05-04 NOTE — Progress Notes (Signed)
Orthopedic Tech Progress Note Patient Details:  Marissa [name] 1961/06/04 [id number]  Patient ID: Marissa [name], female   DOB: 1960-07-02, 55 y.o.   MRN: [id number] Applied cpm 0-55  Marissa [name] 05/04/2016, 6:34 AM

## 2016-05-04 NOTE — Progress Notes (Signed)
Orthopedic Tech Progress Note Patient Details:  Marissa [name] 1960/07/28 [id number]  Ortho Devices Type of Ortho Device: Other (comment) Ortho Device/Splint Location: Bone Foam Bone Foam was on back order. Bone Foam arrived and it was given to patient.   Kristopher Oppenheim 05/04/2016, 3:53 PM

## 2016-12-27 ENCOUNTER — Encounter: Payer: Self-pay | Admitting: *Deleted

## 2016-12-27 ENCOUNTER — Emergency Department
Admission: EM | Admit: 2016-12-27 | Discharge: 2016-12-27 | Disposition: A | Discharge status: Home / Self Care | Payer: 59 | Source: Home / Self Care | Attending: Family Medicine | Admitting: Family Medicine

## 2016-12-27 DIAGNOSIS — L089 Local infection of the skin and subcutaneous tissue, unspecified: Secondary | ICD-10-CM | POA: Diagnosis not present

## 2016-12-27 DIAGNOSIS — S91331A Puncture wound without foreign body, right foot, initial encounter: Secondary | ICD-10-CM

## 2016-12-27 MED ORDER — IBUPROFEN 800 MG PO TABS
800.0000 mg | ORAL_TABLET | Freq: Once | ORAL | Status: AC
  Administered 2016-12-27: 800 mg via ORAL

## 2016-12-27 MED ORDER — AMOXICILLIN-POT CLAVULANATE 875-125 MG PO TABS: 1.0000 | ORAL_TABLET | Freq: Two times a day (BID) | ORAL | 0 refills | Status: DC

## 2016-12-27 NOTE — ED Provider Notes (Signed)
CSN: [id number]     Arrival date & time 12/27/16  1054 History   First MD Initiated Contact with Patient 12/27/16 1115     Chief Complaint  Patient presents with  . Foreign Body in Skin   (Consider location/radiation/quality/duration/timing/severity/associated sxs/prior Treatment) HPI  Marissa [name] is a 56 y.o. female presenting to UC with c/o gradually worsening Right foot pain after getting a wood splinter in the bottom of her foot 3 days ago from walking [place] a wood dock.  She believes she got most if not all of the splinter out but still has an occasional sharp pain when walking. Pain is minimal at rest.  Denies fever, chills, n/v/d. She has been applying Neosporin w/o much relief. Last Tdap 2014.   Past Medical History:  Diagnosis Date  . Abnormal uterine bleeding    due to fibroids  . Anxiety   . Arthritis    knee, scheduled for knee replacement 04/2016  . Asthma    exercise induced-no inhalers  . Dysmenorrhea    2000  from fibroids  . Family history of adverse reaction to anesthesia 02/26/2016   father had shoulder replacement-admitted back to hospital for related complications  . Fibroid   . Genital warts 1994   Tx  x2  . GERD (gastroesophageal reflux disease)   . Head injury with loss of consciousness ([place]) 1986  . Hyperlipidemia   . Primary localized osteoarthrosis of the knee, right   . Sleep apnea    has a cpap-cannot use-makes her sick  . 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 ABDOMINAL HYSTERECTOMY  09/11/2002   ovaries retained. 2o fibroids  . APPENDECTOMY  1982  . BREAST SURGERY  1997   cyst left breast rem. benign  . COLONOSCOPY  06/2015   polyp recheck 5 yrs  . EXCISION VAGINAL CYST N/A 03/02/2016   Procedure: excision of vaginal polyp;  Surgeon: Nunzio Cobbs, MD;  Location: [facility];  Service: Gynecology;  Laterality: N/A;  . KNEE ARTHROSCOPY Right 07/16/2013   Procedure: RIGHT ARTHROSCOPY KNEE, partial lateral menisectomy and chrondromalasia, with microfracture of lateral femeral chondyl, and excision of plica ;  Surgeon: Kerin Salen, MD;  Location: [facility];  Service: Orthopedics;  Laterality: Right;  . KNEE ARTHROSCOPY Right 07/05/2014  . RECONSTRUCTION OF NOSE  1986  . SHOULDER ARTHROSCOPY WITH ROTATOR CUFF REPAIR AND SUBACROMIAL DECOMPRESSION Left 08/02/2014   Procedure: SHOULDER ARTHROSCOPY WITH ROTATOR CUFF REPAIR AND SUBACROMIAL DECOMPRESSION;  Surgeon: Hessie Dibble, MD;  Location: [facility];  Service: Orthopedics;  Laterality: Left;  . SHOULDER SURGERY Right 2003   rt  . STERIOD INJECTION Left 08/02/2014   Procedure: LEFT THUMB INJECTION;  Surgeon: Hessie Dibble, MD;  Location: [facility];  Service: Orthopedics;  Laterality: Left;  . TOTAL KNEE ARTHROPLASTY Right 05/03/2016   Procedure: RIGHT TOTAL KNEE ARTHROPLASTY;  Surgeon: Elsie Saas, MD;  Location: [facility];  Service: Orthopedics;  Laterality: Right;   Family History  Problem Relation Age of Onset  . Diabetes Mother   . Hyperlipidemia Mother   . Hypertension Mother   . Ulcers Mother   . Fibromyalgia Mother   . Sudden death Maternal Grandfather   . Cancer Maternal Grandmother   . Cancer Paternal Grandmother        breast/liver  . Hyperlipidemia Father   . Seizures Father   . Hypertension Father   .  Hyperlipidemia Brother   . Heart attack Neg Hx    Social History  Substance Use Topics  . Smoking status: Never Smoker  . Smokeless tobacco: Never Used  . Alcohol use 4.8 oz/week    6 Glasses of wine, 1 Cans of beer, 1 Shots of liquor per week   OB History    Gravida Para Term Preterm AB Living   0             SAB TAB Ectopic Multiple Live Births                 Review of Systems  Constitutional: Negative for chills and fever.  Gastrointestinal: Negative for nausea and vomiting.  Musculoskeletal: Negative for  arthralgias and myalgias.  Skin: Positive for color change and wound. Negative for rash.    Allergies  Statins  Home Medications   Prior to Admission medications   Medication Sig Start Date End Date Taking? Authorizing Provider  UNKNOWN TO PATIENT    Yes [provider]  amoxicillin-clavulanate (AUGMENTIN) 875-125 MG tablet Take 1 tablet by mouth 2 (two) times daily. One po bid x 7 days 12/27/16   Noe Gens, PA-C   Meds Ordered and Administered this Visit   Medications  ibuprofen (ADVIL,MOTRIN) tablet 800 mg (800 mg Oral Given 12/27/16 1202)    BP 111/76 (BP Location: Left Arm)   Pulse 65   Temp 97.7 F (36.5 C) (Oral)   Resp 16   Ht 5\' 4"  (1.626 m)   Wt 240 lb (108.9 kg)   LMP 08/20/2002   SpO2 97%   BMI 41.20 kg/m  No data found.   Physical Exam  Constitutional: She is oriented to person, place, and time. She appears well-developed and well-nourished. No distress.  HENT:  Head: Normocephalic and atraumatic.  Eyes: EOM are normal.  Neck: Normal range of motion.  Cardiovascular: Normal rate.   Pulmonary/Chest: Effort normal.  Musculoskeletal: Normal range of motion.  Neurological: She is alert and oriented to person, place, and time.  Skin: Skin is warm and dry. She is not diaphoretic. There is erythema.  Right foot, plantar aspect, mid foot: puncture wound with 40mm area of surrounding erythema. Tender. No foreign bodies visualized or palpated.   Psychiatric: She has a normal mood and affect. Her behavior is normal.  Nursing note and vitals reviewed.   Urgent Care Course     .Foreign Body Removal Date/Time: 12/27/2016 1:16 PM Performed by: Noe Gens Authorized by: Theone Murdoch A  Consent: Verbal consent obtained. Written consent not obtained. Risks and benefits: risks, benefits and alternatives were discussed Consent given by: patient Patient understanding: patient states understanding of the procedure being performed Site marked: the  operative site was marked Required items: required blood products, implants, devices, and special equipment available Patient identity confirmed: verbally with patient Body area: skin General location: lower extremity Location details: right foot Anesthesia: local infiltration  Anesthesia: Local Anesthetic: lidocaine 1% with epinephrine  Sedation: Patient sedated: no Patient restrained: no Patient cooperative: yes Localization method: probed (non-visualized) Removal mechanism: irrigation and forceps (scant purulent discharge gently massaged out of puncture wound.) Dressing: antibiotic ointment and dressing applied Tendon involvement: none Complexity: simple 0 objects recovered. Post-procedure assessment: unsure if residual foreign bodies present. Patient tolerance: Patient tolerated the procedure well with no immediate complications   (including critical care time)  Labs Review Labs Reviewed - No data to display  Imaging Review No results found.    MDM  1. Infected puncture wound of plantar aspect of foot, right, initial encounter    Wound cleaned, anesthetized and superficially probed for potential remaining FB. Small amount of pus drained from puncture wound. No FB seen or palpated.  Will treat for infected puncture wound. Rx: Augmentin Encouraged to soak foot 2-3 times daily F/u with PCP in 4-5 days for recheck of symptoms, may f/u in UC if needed.     Noe Gens, [place] 12/27/16 1319

## 2016-12-27 NOTE — ED Triage Notes (Signed)
Franceska reports that she got a splinter in the bottom of her RT foot 3 days ago. She believes she got most of the splinter out, but she still has some pain. No OTC oral meds. She has applied Neosporin. Tdap 2014.

## 2017-02-04 ENCOUNTER — Other Ambulatory Visit: Payer: Self-pay | Admitting: Bariatrics

## 2017-02-04 ENCOUNTER — Other Ambulatory Visit: Payer: Self-pay | Admitting: Internal Medicine

## 2017-02-04 DIAGNOSIS — Z1231 Encounter for screening mammogram for malignant neoplasm of breast: Secondary | ICD-10-CM

## 2017-02-15 ENCOUNTER — Ambulatory Visit (INDEPENDENT_AMBULATORY_CARE_PROVIDER_SITE_OTHER): Payer: 59 | Admitting: Obstetrics and Gynecology

## 2017-02-15 ENCOUNTER — Encounter: Payer: Self-pay | Admitting: Obstetrics and Gynecology

## 2017-02-15 VITALS — BP 110/70 | HR 68 | Resp 18 | Ht 63.75 in | Wt 254.0 lb

## 2017-02-15 DIAGNOSIS — Z0000 Encounter for general adult medical examination without abnormal findings: Secondary | ICD-10-CM | POA: Diagnosis not present

## 2017-02-15 DIAGNOSIS — Z01419 Encounter for gynecological examination (general) (routine) without abnormal findings: Secondary | ICD-10-CM

## 2017-02-15 DIAGNOSIS — R635 Abnormal weight gain: Secondary | ICD-10-CM | POA: Diagnosis not present

## 2017-02-15 LAB — POCT URINALYSIS DIPSTICK
BILIRUBIN UA: NEGATIVE
Blood, UA: NEGATIVE
Glucose, UA: NEGATIVE
KETONES UA: NEGATIVE
LEUKOCYTES UA: NEGATIVE
Nitrite, UA: NEGATIVE
Protein, UA: NEGATIVE
Urobilinogen, UA: 0.2 E.U./dL
pH, UA: 5 (ref 5.0–8.0)

## 2017-02-15 NOTE — Patient Instructions (Signed)
Consider [facility] Weight Management program or [facility] group if you wish to attend a free seminar about weight loss.    EXERCISE AND DIET:  We recommended that you start or continue a regular exercise program for good health. Regular exercise means any activity that makes your heart beat faster and makes you sweat.  We recommend exercising at least 30 minutes per day at least 3 days a week, preferably 4 or 5.  We also recommend a diet low in fat and sugar.  Inactivity, poor dietary choices and obesity can cause diabetes, heart attack, stroke, and kidney damage, among others.    ALCOHOL AND SMOKING:  Women should limit their alcohol intake to no more than 7 drinks/beers/glasses of wine (combined, not each!) per week. Moderation of alcohol intake to this level decreases your risk of breast cancer and liver damage. And of course, no recreational drugs are part of a healthy lifestyle.  And absolutely no smoking or even second hand smoke. Most people know smoking can cause heart and lung diseases, but did you know it also contributes to weakening of your bones? Aging of your skin?  Yellowing of your teeth and nails?  CALCIUM AND VITAMIN D:  Adequate intake of calcium and Vitamin D are recommended.  The recommendations for exact amounts of these supplements seem to change often, but generally speaking 600 mg of calcium (either carbonate or citrate) and 800 units of Vitamin D per day seems prudent. Certain women may benefit from higher intake of Vitamin D.  If you are among these women, your doctor will have told you during your visit.    PAP SMEARS:  Pap smears, to check for cervical cancer or precancers,  have traditionally been done yearly, although recent scientific advances have shown that most women can have pap smears less often.  However, every woman still should have a physical exam from her gynecologist every year. It will include a breast check, inspection of the vulva and vagina to  check for abnormal growths or skin changes, a visual exam of the cervix, and then an exam to evaluate the size and shape of the uterus and ovaries.  And after 55 years of age, a rectal exam is indicated to check for rectal cancers. We will also provide age appropriate advice regarding health maintenance, like when you should have certain vaccines, screening for sexually transmitted diseases, bone density testing, colonoscopy, mammograms, etc.   MAMMOGRAMS:  All women over 38 years old should have a yearly mammogram. Many facilities now offer a "3D" mammogram, which may cost around $50 extra out of pocket. If possible,  we recommend you accept the option to have the 3D mammogram performed.  It both reduces the number of women who will be called back for extra views which then turn out to be normal, and it is better than the routine mammogram at detecting truly abnormal areas.    COLONOSCOPY:  Colonoscopy to screen for colon cancer is recommended for all women at age 28.  We know, you hate the idea of the prep.  We agree, BUT, having colon cancer and not knowing it is worse!!  Colon cancer so often starts as a polyp that can be seen and removed at colonscopy, which can quite literally save your life!  And if your first colonoscopy is normal and you have no family history of colon cancer, most women don't have to have it again for 10 years.  Once every ten years, you can  do something that may end up saving your life, right?  We will be happy to help you get it scheduled when you are ready.  Be sure to check your insurance coverage so you understand how much it will cost.  It may be covered as a preventative service at no cost, but you should check your particular policy.

## 2017-02-15 NOTE — Progress Notes (Signed)
56 y.o. G0P0 [name] here for annual exam.    Struggling with weight gain.  TFTs are normal.  Wants full thyroid check today.  Has really done a lot of different programs.  Some chest pain and palpitations.  Random.  Has high cholesterol.  Lower abdominal pain for the last 1 - 2 weeks since starting. methylfolate. No real change in BMS.  Urine dip normal.   PCP:   Dr. Thea Silversmith - [facility]  Patient's last menstrual period was 08/20/2002.           Sexually active: No.  The current method of family planning is status post hysterectomy -- ovaries remain.    Exercising: Yes.    cardio and weights Smoker:  no  Health Maintenance: Pap: 12/24/15 Pap and HR HPV negative   12/30/09 Negative History of abnormal Pap:  no MMG:  12/08/15 BIRADS 1 negative/density c - Schedule for TBD for tomorrow.  Colonoscopy:  07/2015 Polyps benign - f/u 5 years  BMD:   n/a  Result  n/a TDaP:  2014 HIV: 12/24/15 Negative Hep C: 12/24/15 Negative Screening Labs: PCP takes care of labs    reports that she has never smoked. She has never used smokeless tobacco. She reports that she drinks about 3.0 oz of alcohol per week . She reports that she does not use drugs.  Past Medical History:  Diagnosis Date  . Abnormal uterine bleeding    due to fibroids  . Anxiety   . Arthritis    knee, scheduled for knee replacement 04/2016  . Asthma    exercise induced-no inhalers  . Dysmenorrhea    2000  from fibroids  . Family history of adverse reaction to anesthesia 02/26/2016   father had shoulder replacement-admitted back to hospital for related complications  . Fibroid   . Genital warts 1994   Tx  x2  . GERD (gastroesophageal reflux disease)   . Head injury with loss of consciousness ([place]) 1986  . Hyperlipidemia   . Primary localized osteoarthrosis of the knee, right   . Sleep apnea    has a cpap-cannot use-makes her sick  . Unilateral post-traumatic osteoarthritis, right  knee 04/21/2016    Past Surgical History:  Procedure Laterality Date  . ABDOMINAL HYSTERECTOMY  09/11/2002   ovaries retained. 2o fibroids  . APPENDECTOMY  1982  . BREAST SURGERY  1997   cyst left breast rem. benign  . COLONOSCOPY  06/2015   polyp recheck 5 yrs  . EXCISION VAGINAL CYST N/A 03/02/2016   Procedure: excision of vaginal polyp;  Surgeon: Nunzio Cobbs, MD;  Location: [facility];  Service: Gynecology;  Laterality: N/A;  . KNEE ARTHROSCOPY Right 07/16/2013   Procedure: RIGHT ARTHROSCOPY KNEE, partial lateral menisectomy and chrondromalasia, with microfracture of lateral femeral chondyl, and excision of plica ;  Surgeon: Kerin Salen, MD;  Location: [place] [facility];  Service: Orthopedics;  Laterality: Right;  . KNEE ARTHROSCOPY Right 07/05/2014  . RECONSTRUCTION OF NOSE  1986  . SHOULDER ARTHROSCOPY WITH ROTATOR CUFF REPAIR AND SUBACROMIAL DECOMPRESSION Left 08/02/2014   Procedure: SHOULDER ARTHROSCOPY WITH ROTATOR CUFF REPAIR AND SUBACROMIAL DECOMPRESSION;  Surgeon: Hessie Dibble, MD;  Location: [place];  Service: Orthopedics;  Laterality: Left;  . SHOULDER SURGERY Right 2003   rt  . STERIOD INJECTION Left 08/02/2014   Procedure: LEFT THUMB INJECTION;  Surgeon: Hessie Dibble, MD;  Location: [place];  Service: Orthopedics;  Laterality:  Left;  . TOTAL KNEE ARTHROPLASTY Right 05/03/2016   Procedure: RIGHT TOTAL KNEE ARTHROPLASTY;  Surgeon: Elsie Saas, MD;  Location: [facility];  Service: Orthopedics;  Laterality: Right;    Current Outpatient Prescriptions  Medication Sig Dispense Refill  . ezetimibe (ZETIA) 10 MG tablet Take 10 mg by mouth as needed.    Marland Kitchen ibuprofen (ADVIL,MOTRIN) 800 MG tablet as needed.    Marland Kitchen l-methylfolate-B6-B12 (METANX) 3-35-2 MG TABS tablet Take 1 tablet by mouth daily.    . Melatonin 10 MG TABS Take by mouth at bedtime.    . Methylcobalamin 5000 MCG TBDP Take by mouth daily.    . Multiple Vitamin  (THERA) TABS Take by mouth daily.    . NON FORMULARY Curamin - take one tablet twice daily    . omeprazole-sodium bicarbonate (ZEGERID) 40-1100 MG capsule Take by mouth as needed.     No current facility-administered medications for this visit.     Family History  Problem Relation Age of Onset  . Diabetes Mother   . Hyperlipidemia Mother   . Hypertension Mother   . Ulcers Mother   . Fibromyalgia Mother   . Sudden death Maternal Grandfather   . Cancer Maternal Grandmother   . Cancer Paternal Grandmother        breast/liver  . Hyperlipidemia Father   . Seizures Father   . Hypertension Father   . Hyperlipidemia Brother   . Heart attack Neg Hx     ROS:  Pertinent items are noted in HPI.  Otherwise, a comprehensive ROS was negative.  Exam:   BP 110/70 (BP Location: Right Arm, Patient Position: Sitting, Cuff Size: Large)   Pulse 68   Resp 18   Ht 5' 3.75" (1.619 m)   Wt 254 lb (115.2 kg)   LMP 08/20/2002   BMI 43.94 kg/m     General appearance: alert, cooperative and appears stated age Head: Normocephalic, without obvious abnormality, atraumatic Neck: no adenopathy, supple, symmetrical, trachea midline and thyroid normal to inspection and palpation Lungs: clear to auscultation bilaterally Breasts: normal appearance, no masses or tenderness, No nipple retraction or dimpling, No nipple discharge or bleeding, No axillary or supraclavicular adenopathy Heart: regular rate and rhythm Abdomen: soft, non-tender; no masses, no organomegaly Extremities: extremities normal, atraumatic, no cyanosis or edema Skin: Skin color, texture, turgor normal. No rashes or lesions Lymph nodes: Cervical, supraclavicular, and axillary nodes normal. No abnormal inguinal nodes palpated Neurologic: Grossly normal  Pelvic: External genitalia:  no lesions              Urethra:  normal appearing urethra with no masses, tenderness or lesions              Bartholins and Skenes: normal                  Vagina: normal appearing vagina with normal color and discharge, no lesions              Cervix:  Absent.               Pap taken: No. Bimanual Exam:  Uterus:   Absent.               Adnexa: no mass, fullness, tenderness              Rectal exam: Yes.  .  Confirms.              Anus:  normal sphincter tone, no lesions  Chaperone was present for exam.  Assessment:   Well woman visit with normal exam. Status post TAH.  Ovaries remain.  Elevated cholesterol.  Difficulty with weight loss.  Random chest pain and palpitations for the last 2 weeks.  Plan: Mammogram screening discussed. Recommended self breast awareness. Pap and HR HPV as above. Guidelines for Calcium, Vitamin D, regular exercise program including cardiovascular and weight bearing exercise. TFTs, and cortisol.  I discussed [facility] Weight Management program and [facility] group for weight loss seminar.  She will call her PCP regarding her cardiac symptoms and she will call 911 if she has persistent chest pain.  Follow up annually and prn.   After visit summary provided.

## 2017-02-16 ENCOUNTER — Ambulatory Visit
Admission: RE | Admit: 2017-02-16 | Discharge: 2017-02-16 | Disposition: A | Discharge status: Home / Self Care | Payer: 59 | Source: Ambulatory Visit | Attending: Internal Medicine | Admitting: Internal Medicine

## 2017-02-16 DIAGNOSIS — Z1231 Encounter for screening mammogram for malignant neoplasm of breast: Secondary | ICD-10-CM

## 2017-02-16 LAB — THYROID PANEL WITH TSH
Free Thyroxine Index: 1.8 (ref 1.2–4.9)
T3 Uptake Ratio: 27 % (ref 24–39)
T4, Total: 6.8 ug/dL (ref 4.5–12.0)
TSH: 2.54 u[IU]/mL (ref 0.450–4.500)

## 2017-02-16 LAB — CORTISOL: Cortisol: 7.1 ug/dL

## 2018-02-07 ENCOUNTER — Other Ambulatory Visit: Payer: Self-pay | Admitting: Obstetrics and Gynecology

## 2018-02-07 ENCOUNTER — Other Ambulatory Visit: Payer: Self-pay | Admitting: Internal Medicine

## 2018-02-07 DIAGNOSIS — Z1231 Encounter for screening mammogram for malignant neoplasm of breast: Secondary | ICD-10-CM

## 2018-02-21 ENCOUNTER — Emergency Department
Admission: EM | Admit: 2018-02-21 | Discharge: 2018-02-21 | Disposition: A | Discharge status: Home / Self Care | Payer: 59 | Source: Home / Self Care | Attending: Family Medicine | Admitting: Family Medicine

## 2018-02-21 ENCOUNTER — Other Ambulatory Visit: Payer: Self-pay

## 2018-02-21 DIAGNOSIS — H60392 Other infective otitis externa, left ear: Secondary | ICD-10-CM | POA: Diagnosis not present

## 2018-02-21 MED ORDER — NEOMYCIN-POLYMYXIN-HC 3.5-10000-1 OT SUSP: 4.0000 [drp] | Freq: Three times a day (TID) | OTIC | 0 refills | Status: AC

## 2018-02-21 MED ORDER — FLUCONAZOLE 200 MG PO TABS: 200.0000 mg | ORAL_TABLET | Freq: Every day | ORAL | 0 refills | Status: DC

## 2018-02-21 NOTE — ED Provider Notes (Signed)
[name]    CSN: [id number] Arrival date & time: 02/21/18  1444     History   Chief Complaint Chief Complaint  Patient presents with  . Otalgia    left ear    HPI Marissa [name] is a 57 y.o. female.   HPI  Marissa [name] is a 57 y.o. female presenting to UC with c/o Left ear pain for 2 days. Pain is aching and muffled at times. Itching some. She did go swimming recently. She used alcohol in her ear with vinegar, mild to moderate relief.  She is going to [place] in about 1 week to go scuba diving and hopes to be better before she leaves.  Denies fever, chills, n/v/d. No cough or congestion.    Past Medical History:  Diagnosis Date  . Abnormal uterine bleeding    due to fibroids  . Anxiety   . Arthritis    knee, scheduled for knee replacement 04/2016  . Asthma    exercise induced-no inhalers  . Dysmenorrhea    2000  from fibroids  . Family history of adverse reaction to anesthesia 02/26/2016   father had shoulder replacement-admitted back to hospital for related complications  . Fibroid   . Genital warts 1994   Tx  x2  . GERD (gastroesophageal reflux disease)   . Head injury with loss of consciousness ([place]) 1986  . Hyperlipidemia   . Primary localized osteoarthrosis of the knee, right   . Sleep apnea    has a cpap-cannot use-makes her sick  . Unilateral post-traumatic osteoarthritis, right knee 04/21/2016    Patient Active Problem List   Diagnosis Date Noted  . Post-traumatic osteoarthritis of right knee 05/03/2016  . Unilateral post-traumatic osteoarthritis, right knee 04/21/2016  . Primary localized osteoarthrosis of the knee, right   . Cough 08/16/2011  . Right hand pain 01/18/2011  . Obstructive sleep apnea 07/10/2010  . Intrinsic asthma 06/25/2010  . DYSPNEA ON EXERTION 05/14/2010  . Hyperlipidemia 07/14/2007  . GERD 07/14/2007  . Diaphragmatic hernia 04/06/2007    Past Surgical History:  Procedure Laterality Date  . ABDOMINAL  HYSTERECTOMY  09/11/2002   ovaries retained. 2o fibroids  . APPENDECTOMY  1982  . BREAST EXCISIONAL BIOPSY Left 09/1995  . BREAST SURGERY  1997   cyst left breast rem. benign  . COLONOSCOPY  06/2015   polyp recheck 5 yrs  . EXCISION VAGINAL CYST N/A 03/02/2016   Procedure: excision of vaginal polyp;  Surgeon: Nunzio Cobbs, MD;  Location: [facility];  Service: Gynecology;  Laterality: N/A;  . KNEE ARTHROSCOPY Right 07/16/2013   Procedure: RIGHT ARTHROSCOPY KNEE, partial lateral menisectomy and chrondromalasia, with microfracture of lateral femeral chondyl, and excision of plica ;  Surgeon: Kerin Salen, MD;  Location: [facility];  Service: Orthopedics;  Laterality: Right;  . KNEE ARTHROSCOPY Right 07/05/2014  . RECONSTRUCTION OF NOSE  1986  . SHOULDER ARTHROSCOPY WITH ROTATOR CUFF REPAIR AND SUBACROMIAL DECOMPRESSION Left 08/02/2014   Procedure: SHOULDER ARTHROSCOPY WITH ROTATOR CUFF REPAIR AND SUBACROMIAL DECOMPRESSION;  Surgeon: Hessie Dibble, MD;  Location: [facility];  Service: Orthopedics;  Laterality: Left;  . SHOULDER SURGERY Right 2003   rt  . STERIOD INJECTION Left 08/02/2014   Procedure: LEFT THUMB INJECTION;  Surgeon: Hessie Dibble, MD;  Location: [facility];  Service: Orthopedics;  Laterality: Left;  . TOTAL KNEE ARTHROPLASTY Right 05/03/2016   Procedure: RIGHT TOTAL KNEE ARTHROPLASTY;  Surgeon: Herbie Baltimore  Noemi Chapel, MD;  Location: [facility];  Service: Orthopedics;  Laterality: Right;    OB History    Gravida  0   Para      Term      Preterm      AB      Living        SAB      TAB      Ectopic      Multiple      Live Births               Home Medications    Prior to Admission medications   Medication Sig Start Date End Date Taking? Authorizing Provider  ezetimibe (ZETIA) 10 MG tablet Take 10 mg by mouth as needed. 02/07/17   [provider]  fluconazole (DIFLUCAN) 200 MG tablet Take 1 tablet (200  mg total) by mouth daily. 02/21/18   Noe Gens, PA-C  ibuprofen (ADVIL,MOTRIN) 800 MG tablet as needed. 09/27/16   [provider]  l-methylfolate-B6-B12 (METANX) 3-35-2 MG TABS tablet Take 1 tablet by mouth daily.    [provider]  Melatonin 10 MG TABS Take by mouth at bedtime.    [provider]  Methylcobalamin 5000 MCG TBDP Take by mouth daily.    [provider]  Multiple Vitamin (THERA) TABS Take by mouth daily.    [provider]  neomycin-polymyxin-hydrocortisone (CORTISPORIN) 3.5-10000-1 OTIC suspension Place 4 drops into the left ear 3 (three) times daily for 7 days. 02/21/18 02/28/18  Noe Gens, PA-C  NON FORMULARY Curamin - take one tablet twice daily    [provider]  omeprazole-sodium bicarbonate (ZEGERID) 40-1100 MG capsule Take by mouth as needed.    [provider]    Family History Family History  Problem Relation Age of Onset  . Diabetes Mother   . Hyperlipidemia Mother   . Hypertension Mother   . Ulcers Mother   . Fibromyalgia Mother   . Sudden death Maternal Grandfather   . Cancer Maternal Grandmother   . Cancer Paternal Grandmother        breast/liver  . Breast cancer Paternal Grandmother   . Hyperlipidemia Father   . Seizures Father   . Hypertension Father   . Hyperlipidemia Brother   . Heart attack Neg Hx     Social History Social History   Tobacco Use  . Smoking status: Never Smoker  . Smokeless tobacco: Never Used  Substance Use Topics  . Alcohol use: Yes    Alcohol/week: 5.0 standard drinks    Types: 1 Cans of beer, 1 Shots of liquor, 3 Glasses of wine per week  . Drug use: No     Allergies   Statins   Review of Systems Review of Systems  Constitutional: Negative for chills and fever.  HENT: Positive for ear pain ( Left) and hearing loss (muffled Left). Negative for congestion, sinus pain and sore throat.   Neurological: Negative for dizziness and headaches.      Physical Exam Triage Vital Signs ED Triage Vitals  Enc Vitals Group     BP 02/21/18 1527 119/80     Pulse Rate 02/21/18 1527 100     Resp --      Temp 02/21/18 1527 98.6 F (37 C)     Temp Source 02/21/18 1527 Oral     SpO2 02/21/18 1527 96 %     Weight 02/21/18 1528 258 lb (117 kg)     Height --  Head Circumference --      Peak Flow --      Pain Score 02/21/18 1528 4     Pain Loc --      Pain Edu? --      Excl. in [place]? --    No data found.  Updated Vital Signs BP 119/80 (BP Location: Right Arm)   Pulse 100   Temp 98.6 F (37 C) (Oral)   Wt 258 lb (117 kg)   LMP 08/20/2002   SpO2 96%   BMI 44.63 kg/m   Visual Acuity Right Eye Distance:   Left Eye Distance:   Bilateral Distance:    Right Eye Near:   Left Eye Near:    Bilateral Near:     Physical Exam  Constitutional: She is oriented to person, place, and time. She appears well-developed and well-nourished. No distress.  HENT:  Head: Normocephalic and atraumatic.  Right Ear: Tympanic membrane normal.  Left Ear: Tympanic membrane normal. There is drainage ( white patchy discharge in canal, no bleeding) and swelling. No tenderness. Tympanic membrane is not injected and not erythematous.  No middle ear effusion.  Eyes: EOM are normal.  Neck: Normal range of motion.  Cardiovascular: Normal rate.  Pulmonary/Chest: Effort normal.  Musculoskeletal: Normal range of motion.  Neurological: She is alert and oriented to person, place, and time.  Skin: Skin is warm and dry. She is not diaphoretic.  Psychiatric: She has a normal mood and affect. Her behavior is normal.  Nursing note and vitals reviewed.    UC Treatments / Results  Labs (all labs ordered are listed, but only abnormal results are displayed) Labs Reviewed - No data to display  EKG None  Radiology No results found.  Procedures Procedures (including critical care time)  Medications Ordered in UC Medications - No data to  display  Initial Impression / Assessment and Plan / UC Course  I have reviewed the triage vital signs and the nursing notes.  Pertinent labs & imaging results that were available during my care of the patient were reviewed by me and considered in my medical decision making (see chart for details).     Exam c/w fungal acute otitis externa. Will also cover for secondary bacterial infection. Home care instructions provided.  Final Clinical Impressions(s) / UC Diagnoses   Final diagnoses:  Other infective acute otitis externa of left ear     Discharge Instructions      Please follow up with family medicine in 1 week if not improving, sooner if worsening.     ED Prescriptions    Medication Sig Dispense Auth. Provider   fluconazole (DIFLUCAN) 200 MG tablet Take 1 tablet (200 mg total) by mouth daily. 7 tablet Gerarda Fraction, Derick Seminara O, PA-C   neomycin-polymyxin-hydrocortisone (CORTISPORIN) 3.5-10000-1 OTIC suspension Place 4 drops into the left ear 3 (three) times daily for 7 days. 10 mL Noe Gens, PA-C     Controlled Substance Prescriptions [place] Controlled Substance Registry consulted? Not Applicable   Tyrell Antonio 02/24/18 1610

## 2018-02-21 NOTE — ED Triage Notes (Signed)
Went swimming, and has had left ear pain x 2 days.  Used alcohol in her ear.

## 2018-02-21 NOTE — Discharge Instructions (Signed)
°  Please follow up with family medicine in 1 week if not improving, sooner if worsening.

## 2018-03-03 NOTE — Progress Notes (Signed)
57 y.o. G0P0 [name] here for annual exam.    Having hot flashes for 5 years.  Trying natural herbal products which do not work consistently.  Wants to avoid hormones.   Notices a lump in her groin.  Noticed it a couple of weeks ago.  Back from [place].  PCP:   Dr Marissa [name]  Patient's last menstrual period was 08/20/2002.           Sexually active: No.  The current method of family planning is none.    Exercising: No.   Smoker:  no  Health Maintenance: Pap:  12/2015 normal History of abnormal Pap:  no MMG:  01/2017 BI-RADS CATEGORY  1: Negative. Colonoscopy:  2017 hx of polyps BMD:   NA Result  n/a TDaP:  2009 Gardasil:   no HIV: done Hep C: done Screening Labs:  With PCP.   reports that she has never smoked. She has never used smokeless tobacco. She reports that she drinks about 5.0 standard drinks of alcohol per week. She reports that she does not use drugs.  Past Medical History:  Diagnosis Date  . Abnormal uterine bleeding    due to fibroids  . Anxiety   . Arthritis    knee, scheduled for knee replacement 04/2016  . Asthma    exercise induced-no inhalers  . Dysmenorrhea    2000  from fibroids  . Family history of adverse reaction to anesthesia 02/26/2016   father had shoulder replacement-admitted back to hospital for related complications  . Fibroid   . Genital warts 1994   Tx  x2  . GERD (gastroesophageal reflux disease)   . Head injury with loss of consciousness ([place]) 1986  . Hyperlipidemia   . Primary localized osteoarthrosis of the knee, right   . Sleep apnea    has a cpap-cannot use-makes her sick  . Unilateral post-traumatic osteoarthritis, right knee 04/21/2016    Past Surgical History:  Procedure Laterality Date  . ABDOMINAL HYSTERECTOMY  09/11/2002   ovaries retained. 2o fibroids  . APPENDECTOMY  1982  . BREAST EXCISIONAL BIOPSY Left 09/1995  . BREAST SURGERY  1997   cyst left breast rem. benign  . COLONOSCOPY  06/2015   polyp  recheck 5 yrs  . EXCISION VAGINAL CYST N/A 03/02/2016   Procedure: excision of vaginal polyp;  Surgeon: Nunzio Cobbs, MD;  Location: [facility];  Service: Gynecology;  Laterality: N/A;  . KNEE ARTHROSCOPY Right 07/16/2013   Procedure: RIGHT ARTHROSCOPY KNEE, partial lateral menisectomy and chrondromalasia, with microfracture of lateral femeral chondyl, and excision of plica ;  Surgeon: Kerin Salen, MD;  Location: [facility];  Service: Orthopedics;  Laterality: Right;  . KNEE ARTHROSCOPY Right 07/05/2014  . RECONSTRUCTION OF NOSE  1986  . SHOULDER ARTHROSCOPY WITH ROTATOR CUFF REPAIR AND SUBACROMIAL DECOMPRESSION Left 08/02/2014   Procedure: SHOULDER ARTHROSCOPY WITH ROTATOR CUFF REPAIR AND SUBACROMIAL DECOMPRESSION;  Surgeon: Hessie Dibble, MD;  Location: [facility];  Service: Orthopedics;  Laterality: Left;  . SHOULDER SURGERY Right 2003   rt  . STERIOD INJECTION Left 08/02/2014   Procedure: LEFT THUMB INJECTION;  Surgeon: Hessie Dibble, MD;  Location: [facility];  Service: Orthopedics;  Laterality: Left;  . TOTAL KNEE ARTHROPLASTY Right 05/03/2016   Procedure: RIGHT TOTAL KNEE ARTHROPLASTY;  Surgeon: Elsie Saas, MD;  Location: [facility];  Service: Orthopedics;  Laterality: Right;    Current Outpatient Medications  Medication Sig Dispense Refill  .  ezetimibe (ZETIA) 10 MG tablet Take 10 mg by mouth as needed.    Marland Kitchen ibuprofen (ADVIL,MOTRIN) 800 MG tablet as needed.    . Multiple Vitamin (THERA) TABS Take by mouth daily.    . NON FORMULARY Curamin - take one tablet twice daily    . omeprazole-sodium bicarbonate (ZEGERID) 40-1100 MG capsule Take by mouth as needed.     No current facility-administered medications for this visit.     Family History  Problem Relation Age of Onset  . Diabetes Mother   . Hyperlipidemia Mother   . Hypertension Mother   . Ulcers Mother   . Fibromyalgia Mother   . Sudden death Maternal Grandfather   .  Cancer Maternal Grandmother   . Cancer Paternal Grandmother        breast/liver  . Breast cancer Paternal Grandmother   . Hyperlipidemia Father   . Seizures Father   . Hypertension Father   . Hyperlipidemia Brother   . Heart attack Neg Hx     Review of Systems  Constitutional: Negative.   HENT: Negative.   Eyes: Negative.   Respiratory: Negative.   Cardiovascular: Negative.   Gastrointestinal: Negative.   Endocrine: Negative.   Genitourinary:       Lump on left groin area  Musculoskeletal: Negative.   Skin: Negative.   Allergic/Immunologic: Negative.   Neurological: Negative.   Hematological: Negative.   Psychiatric/Behavioral: Negative.   All other systems reviewed and are negative.   Exam:   BP (!) 148/94   Pulse 77   Ht 5\' 4"  (1.626 m)   Wt 254 lb (115.2 kg)   LMP 08/20/2002   BMI 43.60 kg/m     General appearance: alert, cooperative and appears stated age Head: Normocephalic, without obvious abnormality, atraumatic Neck: no adenopathy, supple, symmetrical, trachea midline and thyroid normal to inspection and palpation Lungs: clear to auscultation bilaterally Breasts: normal appearance, no masses or tenderness, No nipple retraction or dimpling, No nipple discharge or bleeding, No axillary or supraclavicular adenopathy Heart: regular rate and rhythm Abdomen: soft, non-tender; no masses, no organomegaly Extremities: extremities normal, atraumatic, no cyanosis or edema.  Left medial thigh with 2.5 cm subcutaneous lump consistent with lipoma.  Skin: Skin color, texture, turgor normal. No rashes or lesions Lymph nodes: Cervical, supraclavicular, and axillary nodes normal. No abnormal inguinal nodes palpated Neurologic: Grossly normal  Pelvic: External genitalia:  no lesions              Urethra:  normal appearing urethra with no masses, tenderness or lesions              Bartholins and Skenes: normal                 Vagina: normal appearing vagina with normal color  and discharge, no lesions              Cervix:  absent              Pap taken: No. Bimanual Exam:  Uterus:  absent              Adnexa: no mass, fullness, tenderness              Rectal exam: Yes.  .  Confirms.              Anus:  normal sphincter tone, no lesions  Chaperone was present for exam.  Assessment:   Well woman visit with normal exam. Status post TAH.  Ovaries remain.  Hot  flashes.  Weight gain.  Lipoma of thigh.   Plan: Mammogram screening. Recommended self breast awareness. Pap and HR HPV as above. Guidelines for Calcium, Vitamin D, regular exercise program including cardiovascular and weight bearing exercise. We discussed herbal options, SSRI/SNRI, and Neurontin.  She will try Estroven.  Tdap. We discussed flu vaccine.  She will need to see general surgeon for removal of lipoma if enlarges or becomes painful.   Follow up annually and prn.   After visit summary provided.

## 2018-03-06 ENCOUNTER — Encounter: Payer: Self-pay | Admitting: Obstetrics and Gynecology

## 2018-03-06 ENCOUNTER — Ambulatory Visit
Admission: RE | Admit: 2018-03-06 | Discharge: 2018-03-06 | Disposition: A | Discharge status: Home / Self Care | Payer: 59 | Source: Ambulatory Visit | Attending: Obstetrics and Gynecology | Admitting: Obstetrics and Gynecology

## 2018-03-06 ENCOUNTER — Ambulatory Visit: Payer: 59 | Admitting: Obstetrics and Gynecology

## 2018-03-06 VITALS — BP 148/94 | HR 77 | Ht 64.0 in | Wt 254.0 lb

## 2018-03-06 DIAGNOSIS — Z23 Encounter for immunization: Secondary | ICD-10-CM | POA: Diagnosis not present

## 2018-03-06 DIAGNOSIS — Z01419 Encounter for gynecological examination (general) (routine) without abnormal findings: Secondary | ICD-10-CM | POA: Diagnosis not present

## 2018-03-06 DIAGNOSIS — Z1231 Encounter for screening mammogram for malignant neoplasm of breast: Secondary | ICD-10-CM

## 2018-03-06 NOTE — Patient Instructions (Addendum)
EXERCISE AND DIET:  We recommended that you start or continue a regular exercise program for good health. Regular exercise means any activity that makes your heart beat faster and makes you sweat.  We recommend exercising at least 30 minutes per day at least 3 days a week, preferably 4 or 5.  We also recommend a diet low in fat and sugar.  Inactivity, poor dietary choices and obesity can cause diabetes, heart attack, stroke, and kidney damage, among others.    CONSIDER A CONSULTATION WITH DR. Dennard Nip FOR WEIGHT LOSS.  ALCOHOL AND SMOKING:  Women should limit their alcohol intake to no more than 7 drinks/beers/glasses of wine (combined, not each!) per week. Moderation of alcohol intake to this level decreases your risk of breast cancer and liver damage. And of course, no recreational drugs are part of a healthy lifestyle.  And absolutely no smoking or even second hand smoke. Most people know smoking can cause heart and lung diseases, but did you know it also contributes to weakening of your bones? Aging of your skin?  Yellowing of your teeth and nails?  CALCIUM AND VITAMIN D:  Adequate intake of calcium and Vitamin D are recommended.  The recommendations for exact amounts of these supplements seem to change often, but generally speaking 600 mg of calcium (either carbonate or citrate) and 800 units of Vitamin D per day seems prudent. Certain women may benefit from higher intake of Vitamin D.  If you are among these women, your doctor will have told you during your visit.    PAP SMEARS:  Pap smears, to check for cervical cancer or precancers,  have traditionally been done yearly, although recent scientific advances have shown that most women can have pap smears less often.  However, every woman still should have a physical exam from her gynecologist every year. It will include a breast check, inspection of the vulva and vagina to check for abnormal growths or skin changes, a visual exam of the cervix, and  then an exam to evaluate the size and shape of the uterus and ovaries.  And after 55 years of age, a rectal exam is indicated to check for rectal cancers. We will also provide age appropriate advice regarding health maintenance, like when you should have certain vaccines, screening for sexually transmitted diseases, bone density testing, colonoscopy, mammograms, etc.   MAMMOGRAMS:  All women over 73 years old should have a yearly mammogram. Many facilities now offer a "3D" mammogram, which may cost around $50 extra out of pocket. If possible,  we recommend you accept the option to have the 3D mammogram performed.  It both reduces the number of women who will be called back for extra views which then turn out to be normal, and it is better than the routine mammogram at detecting truly abnormal areas.    COLONOSCOPY:  Colonoscopy to screen for colon cancer is recommended for all women at age 56.  We know, you hate the idea of the prep.  We agree, BUT, having colon cancer and not knowing it is worse!!  Colon cancer so often starts as a polyp that can be seen and removed at colonscopy, which can quite literally save your life!  And if your first colonoscopy is normal and you have no family history of colon cancer, most women don't have to have it again for 10 years.  Once every ten years, you can do something that may end up saving your life, right?  We will be  happy to help you get it scheduled when you are ready.  Be sure to check your insurance coverage so you understand how much it will cost.  It may be covered as a preventative service at no cost, but you should check your particular policy.     Menopause and Herbal Products  I would consider ESTROVEN.  What is menopause? Menopause is the normal time of life when menstrual periods decrease in frequency and eventually stop completely. This process can take several years for some women. Menopause is complete when you have had an absence of menstruation  for a full year since your last menstrual period. It usually occurs between the ages of 31 and 89. It is not common for menopause to begin before the age of 14. During menopause, your body stops producing the female hormones estrogen and progesterone. Common symptoms associated with this loss of hormones (vasomotor symptoms) are:  Hot flashes.  Hot flushes.  Night sweats.  Other common symptoms and complications of menopause include:  Decrease in sex drive.  Vaginal dryness and thinning of the walls of the vagina. This can make sex painful.  Dryness of the skin and development of wrinkles.  Headaches.  Tiredness.  Irritability.  Memory problems.  Weight gain.  Bladder infections.  Hair growth on the face and chest.  Inability to reproduce offspring (infertility).  Loss of density in the bones (osteoporosis) increasing your risk for breaks (fractures).  Depression.  Hardening and narrowing of the arteries (atherosclerosis). This increases your risk of heart attack and stroke.  What treatment options are available? There are many treatment choices for menopause symptoms. The most common treatment is hormone replacement therapy. Many alternative therapies for menopause are emerging, including the use of herbal products. These supplements can be found in the form of herbs, teas, oils, tinctures, and pills. Common herbal supplements for menopause are made from plants that contain phytoestrogens. Phytoestrogens are compounds that occur naturally in plants and plant products. They act like estrogen in the body. Foods and herbs that contain phytoestrogens include:  Soy.  Flax seeds.  Red clover.  Ginseng.  What menopause symptoms may be helped if I use herbal products?  Vasomotor symptoms. These may be helped by: ? Soy. Some studies show that soy may have a moderate benefit for hot flashes. ? Black cohosh. There is limited evidence indicating this may be beneficial for  hot flashes.  Symptoms that are related to heart and blood vessel disease. These may be helped by soy. Studies have shown that soy can help to lower cholesterol.  Depression. This may be helped by: ? St. John's wort. There is limited evidence that shows this may help mild to moderate depression. ? Black cohosh. There is evidence that this may help depression and mood swings.  Osteoporosis. Soy may help to decrease bone loss that is associated with menopause and may prevent osteoporosis. Limited evidence indicates that red clover may offer some bone loss protection as well. Other herbal products that are commonly used during menopause lack enough evidence to support their use as a replacement for conventional menopause therapies. These products include evening primrose, ginseng, and red clover. What are the cases when herbal products should not be used during menopause? Do not use herbal products during menopause without your health care provider's approval if:  You are taking medicine.  You have a preexisting liver condition.  Are there any risks in my taking herbal products during menopause? If you choose to use herbal  products to help with symptoms of menopause, keep in mind that:  Different supplements have different and unmeasured amounts of herbal ingredients.  Herbal products are not regulated the same way that medicines are.  Concentrations of herbs may vary depending on the way they are prepared. For example, the concentration may be different in a pill, tea, oil, and tincture.  Little is known about the risks of using herbal products, particularly the risks of long-term use.  Some herbal supplements can be harmful when combined with certain medicines.  Most commonly reported side effects of herbal products are mild. However, if used improperly, many herbal supplements can cause serious problems. Talk to your health care provider before starting any herbal product. If problems  develop, stop taking the supplement and let your health care provider know. This information is not intended to replace advice given to you by your health care provider. Make sure you discuss any questions you have with your health care provider. Document Released: 11/24/2007 Document Revised: 05/04/2016 Document Reviewed: 11/20/2013 Elsevier Interactive Patient Education  2017 [employer].

## 2018-04-19 ENCOUNTER — Other Ambulatory Visit (HOSPITAL_COMMUNITY): Payer: Self-pay | Admitting: Orthopedic Surgery

## 2018-04-19 DIAGNOSIS — M25561 Pain in right knee: Secondary | ICD-10-CM

## 2018-04-25 ENCOUNTER — Encounter (HOSPITAL_COMMUNITY)
Admission: RE | Admit: 2018-04-25 | Discharge: 2018-04-25 | Disposition: A | Discharge status: Home / Self Care | Payer: 59 | Source: Ambulatory Visit | Attending: Orthopedic Surgery | Admitting: Orthopedic Surgery

## 2018-04-25 ENCOUNTER — Ambulatory Visit (HOSPITAL_COMMUNITY)
Admission: RE | Admit: 2018-04-25 | Discharge: 2018-04-25 | Disposition: A | Discharge status: Home / Self Care | Payer: 59 | Source: Ambulatory Visit | Attending: Orthopedic Surgery | Admitting: Orthopedic Surgery

## 2018-04-25 DIAGNOSIS — M25561 Pain in right knee: Secondary | ICD-10-CM | POA: Diagnosis not present

## 2018-04-25 MED ORDER — TECHNETIUM TC 99M MEDRONATE IV KIT
20.0000 | PACK | Freq: Once | INTRAVENOUS | Status: AC | PRN
  Administered 2018-04-25: 20 via INTRAVENOUS

## 2018-05-11 ENCOUNTER — Telehealth: Payer: Self-pay | Admitting: Obstetrics and Gynecology

## 2018-05-11 NOTE — Telephone Encounter (Signed)
No answer, mailbox full, unable to leave message.

## 2018-05-11 NOTE — Telephone Encounter (Signed)
Patient stated that she is still having hot flashes after trying different natural supplements and is wanting to talk with Dr. Quincy Simmonds about the antidepressant that was discussed at her last visit. Patient stated that she is unaware of the name of the medication.

## 2018-05-12 NOTE — Telephone Encounter (Signed)
Spoke with patient. Requesting "antidepressant" Rx for vasomotor symptoms, discussed at last [facility] 03/06/18. Reports hot flashes, mood changes and depression. Denies SI/HI.  Patient states she has been taking estroven since 02/2018 with no changes. Does not want to try estrogen because of family Hx of breast cancer. Confirmed pharmacy on file, advised will review with Dr. Quincy Simmonds on 05/15/18 and return call with recommendations, patient is agreeable.   Dr. Quincy Simmonds -please advise.

## 2018-05-14 NOTE — Telephone Encounter (Signed)
Please have patient make an appointment to see me before we select the medication for her.  I am happy to help!

## 2018-05-15 NOTE — Telephone Encounter (Signed)
NO answer, mailbox full, unable to leave message.

## 2018-05-16 NOTE — Telephone Encounter (Signed)
Spoke with patient, advised as seen below per Dr. Quincy Simmonds. Patient declines to schedule OV at this time. Patient states she will return call at a later date.   Routing to provider for final review. Patient is agreeable to disposition. Will close encounter.

## 2018-05-16 NOTE — Telephone Encounter (Signed)
No answer, mailbox full, unable to leave message.

## 2018-05-24 ENCOUNTER — Encounter: Payer: Self-pay | Admitting: *Deleted

## 2018-05-24 ENCOUNTER — Emergency Department
Admission: EM | Admit: 2018-05-24 | Discharge: 2018-05-24 | Disposition: A | Discharge status: Home / Self Care | Payer: 59 | Source: Home / Self Care | Attending: Family Medicine | Admitting: Family Medicine

## 2018-05-24 DIAGNOSIS — R6889 Other general symptoms and signs: Secondary | ICD-10-CM

## 2018-05-24 LAB — POCT INFLUENZA A/B
INFLUENZA B, POC: NEGATIVE
Influenza A, POC: NEGATIVE

## 2018-05-24 MED ORDER — IBUPROFEN 600 MG PO TABS
600.0000 mg | ORAL_TABLET | Freq: Once | ORAL | Status: AC
  Administered 2018-05-24: 600 mg via ORAL

## 2018-05-24 MED ORDER — BALOXAVIR MARBOXIL(80 MG DOSE) 2 X 40 MG PO TBPK: 2.0000 | ORAL_TABLET | Freq: Once | ORAL | 0 refills | Status: AC

## 2018-05-24 NOTE — Discharge Instructions (Signed)
  You may take 500mg acetaminophen every 4-6 hours or in combination with ibuprofen 400-600mg every 6-8 hours as needed for pain, inflammation, and fever.  Be sure to well hydrated with clear liquids and get at least 8 hours of sleep at night, preferably more while sick.   Please follow up with family medicine in 1 week if needed.   

## 2018-05-24 NOTE — ED Triage Notes (Signed)
Patient c/o 2 days of aches, chills, HA, no appetite and nasal drainage. Taken Theraflu.

## 2018-05-24 NOTE — ED Provider Notes (Signed)
[name]    CSN: [id number] Arrival date & time: 05/24/18  1052     History   Chief Complaint Chief Complaint  Patient presents with  . Generalized Body Aches  . Chills    HPI Marissa [name] is a 57 y.o. female.   HPI Marissa [name] is a 57 y.o. female presenting to UC with c/o flu-like symptoms that started 2 days ago. Pt c/o generalized HA, body aches, chills, loss of appetite, nasal drainage. Body aches are most bothersome for pt. She has taken Theraflu w/o relief. She received the flu vaccine in October. Denies known sick contacts. No recent travel. Denies n/v/d.    Past Medical History:  Diagnosis Date  . Abnormal uterine bleeding    due to fibroids  . Anxiety   . Arthritis    knee, scheduled for knee replacement 04/2016  . Asthma    exercise induced-no inhalers  . Dysmenorrhea    2000  from fibroids  . Family history of adverse reaction to anesthesia 02/26/2016   father had shoulder replacement-admitted back to hospital for related complications  . Fibroid   . Genital warts 1994   Tx  x2  . GERD (gastroesophageal reflux disease)   . Head injury with loss of consciousness ([place]) 1986  . Hyperlipidemia   . Primary localized osteoarthrosis of the knee, right   . Sleep apnea    has a cpap-cannot use-makes her sick  . Unilateral post-traumatic osteoarthritis, right knee 04/21/2016    Patient Active Problem List   Diagnosis Date Noted  . Post-traumatic osteoarthritis of right knee 05/03/2016  . Unilateral post-traumatic osteoarthritis, right knee 04/21/2016  . Primary localized osteoarthrosis of the knee, right   . Colon polyp 03/23/2013  . Asthma, mild intermittent 01/25/2013  . Class 3 obesity without serious comorbidity with body mass index (BMI) of 40.0 to 44.9 in adult 01/25/2013  . Cough 08/16/2011  . Right hand pain 01/18/2011  . Obstructive sleep apnea 07/10/2010  . Intrinsic asthma 06/25/2010  . DYSPNEA ON EXERTION 05/14/2010  .  Hyperlipidemia 07/14/2007  . GERD 07/14/2007  . Diaphragmatic hernia 04/06/2007    Past Surgical History:  Procedure Laterality Date  . ABDOMINAL HYSTERECTOMY  09/11/2002   ovaries retained. 2o fibroids  . APPENDECTOMY  1982  . BREAST EXCISIONAL BIOPSY Left 09/1995  . BREAST SURGERY  1997   cyst left breast rem. benign  . COLONOSCOPY  06/2015   polyp recheck 5 yrs  . EXCISION VAGINAL CYST N/A 03/02/2016   Procedure: excision of vaginal polyp;  Surgeon: Nunzio Cobbs, MD;  Location: [facility];  Service: Gynecology;  Laterality: N/A;  . KNEE ARTHROSCOPY Right 07/16/2013   Procedure: RIGHT ARTHROSCOPY KNEE, partial lateral menisectomy and chrondromalasia, with microfracture of lateral femeral chondyl, and excision of plica ;  Surgeon: Kerin Salen, MD;  Location: [place];  Service: Orthopedics;  Laterality: Right;  . KNEE ARTHROSCOPY Right 07/05/2014  . LIPOMA EXCISION    . RECONSTRUCTION OF NOSE  1986  . SHOULDER ARTHROSCOPY WITH ROTATOR CUFF REPAIR AND SUBACROMIAL DECOMPRESSION Left 08/02/2014   Procedure: SHOULDER ARTHROSCOPY WITH ROTATOR CUFF REPAIR AND SUBACROMIAL DECOMPRESSION;  Surgeon: Hessie Dibble, MD;  Location: [place];  Service: Orthopedics;  Laterality: Left;  . SHOULDER SURGERY Right 2003   rt  . STERIOD INJECTION Left 08/02/2014   Procedure: LEFT THUMB INJECTION;  Surgeon: Hessie Dibble, MD;  Location: [place];  Service:  Orthopedics;  Laterality: Left;  . TOTAL KNEE ARTHROPLASTY Right 05/03/2016   Procedure: RIGHT TOTAL KNEE ARTHROPLASTY;  Surgeon: Elsie Saas, MD;  Location: [place];  Service: Orthopedics;  Laterality: Right;    OB History    Gravida  0   Para      Term      Preterm      AB      Living        SAB      TAB      Ectopic      Multiple      Live Births               Home Medications    Prior to Admission medications   Medication Sig Start Date End Date Taking?  Authorizing Provider  Baloxavir Marboxil,80 MG Dose, (XOFLUZA) 2 x 40 MG TBPK Take 2 tablets by mouth once for 1 dose. 05/24/18 05/24/18  Noe Gens, PA-C  ezetimibe (ZETIA) 10 MG tablet Take 10 mg by mouth as needed. 02/07/17   [provider]  ibuprofen (ADVIL,MOTRIN) 800 MG tablet as needed. 09/27/16   [provider]  Multiple Vitamin (THERA) TABS Take by mouth daily.    [provider]  NON FORMULARY Curamin - take one tablet twice daily    [provider]  omeprazole-sodium bicarbonate (ZEGERID) 40-1100 MG capsule Take by mouth as needed.    [provider]    Family History Family History  Problem Relation Age of Onset  . Diabetes Mother   . Hyperlipidemia Mother   . Hypertension Mother   . Ulcers Mother   . Fibromyalgia Mother   . Sudden death Maternal Grandfather   . Cancer Maternal Grandmother   . Cancer Paternal Grandmother        breast/liver  . Breast cancer Paternal Grandmother   . Hyperlipidemia Father   . Seizures Father   . Hypertension Father   . Dementia Father   . Hyperlipidemia Brother   . Heart attack Neg Hx     Social History Social History   Tobacco Use  . Smoking status: Never Smoker  . Smokeless tobacco: Never Used  Substance Use Topics  . Alcohol use: Yes    Alcohol/week: 5.0 standard drinks    Types: 3 Glasses of wine, 1 Cans of beer, 1 Shots of liquor per week  . Drug use: No     Allergies   Statins and Hydrocodone   Review of Systems Review of Systems  Constitutional: Positive for activity change, appetite change, chills and fever.  HENT: Positive for congestion and sinus pressure. Negative for ear pain, sore throat, trouble swallowing and voice change.   Respiratory: Positive for cough. Negative for shortness of breath.   Cardiovascular: Negative for chest pain and palpitations.  Gastrointestinal: Negative for abdominal pain, diarrhea, nausea and vomiting.  Musculoskeletal: Positive for  arthralgias, back pain and myalgias.  Skin: Negative for rash.  Neurological: Positive for headaches. Negative for dizziness and light-headedness.     Physical Exam Triage Vital Signs ED Triage Vitals  Enc Vitals Group     BP 05/24/18 1128 (!) 146/90     Pulse Rate 05/24/18 1128 100     Resp 05/24/18 1128 (!) 24     Temp 05/24/18 1128 100.1 F (37.8 C)     Temp Source 05/24/18 1128 Oral     SpO2 05/24/18 1128 94 %     Weight 05/24/18 1129 260 lb (117.9 kg)  Height --      Head Circumference --      Peak Flow --      Pain Score 05/24/18 1128 8     Pain Loc --      Pain Edu? --      Excl. in [place]? --    No data found.  Updated Vital Signs BP (!) 146/90 (BP Location: Right Arm)   Pulse 100   Temp 100.1 F (37.8 C) (Oral)   Resp (!) 24   Wt 260 lb (117.9 kg)   LMP 08/20/2002   SpO2 94%   BMI 44.63 kg/m   Visual Acuity Right Eye Distance:   Left Eye Distance:   Bilateral Distance:    Right Eye Near:   Left Eye Near:    Bilateral Near:     Physical Exam  Constitutional: She is oriented to person, place, and time. She appears well-developed and well-nourished.  Pt lying on exam bed, alert and oriented but appears acutely ill  HENT:  Head: Normocephalic and atraumatic.  Right Ear: Tympanic membrane normal.  Left Ear: Tympanic membrane normal.  Nose: Nose normal. Right sinus exhibits no maxillary sinus tenderness and no frontal sinus tenderness. Left sinus exhibits no maxillary sinus tenderness and no frontal sinus tenderness.  Mouth/Throat: Uvula is midline, oropharynx is clear and moist and mucous membranes are normal.  Eyes: EOM are normal.  Neck: Normal range of motion. Neck supple.  Cardiovascular: Normal rate and regular rhythm.  Pulmonary/Chest: Effort normal and breath sounds normal. No stridor. No respiratory distress. She has no wheezes. She has no rales.  Musculoskeletal: Normal range of motion.  Neurological: She is alert and oriented to person,  place, and time.  Skin: Skin is warm and dry.  Psychiatric: She has a normal mood and affect. Her behavior is normal.  Nursing note and vitals reviewed.    UC Treatments / Results  Labs (all labs ordered are listed, but only abnormal results are displayed) Labs Reviewed  POCT INFLUENZA A/B    EKG None  Radiology No results found.  Procedures Procedures (including critical care time)  Medications Ordered in UC Medications  ibuprofen (ADVIL,MOTRIN) tablet 600 mg (600 mg Oral Given 05/24/18 1131)    Initial Impression / Assessment and Plan / UC Course  I have reviewed the triage vital signs and the nursing notes.  Pertinent labs & imaging results that were available during my care of the patient were reviewed by me and considered in my medical decision making (see chart for details).     Hx and exam c/w influenza Rapid test: NEGATIVE Pt may have false negative, discussed treatment for flu given that pt is within treatment window. Pt would like to try Xofluza. Home care info provided.  Final Clinical Impressions(s) / UC Diagnoses   Final diagnoses:  Flu-like symptoms     Discharge Instructions     You may take 500mg  acetaminophen every 4-6 hours or in combination with ibuprofen 400-600mg  every 6-8 hours as needed for pain, inflammation, and fever.  Be sure to well hydrated with clear liquids and get at least 8 hours of sleep at night, preferably more while sick.   Please follow up with family medicine in 1 week if needed.      ED Prescriptions    Medication Sig Dispense Auth. Provider   Baloxavir Marboxil,80 MG Dose, (XOFLUZA) 2 x 40 MG TBPK Take 2 tablets by mouth once for 1 dose. 1 each Noe Gens, PA-C  Controlled Substance Prescriptions [place] Controlled Substance Registry consulted? Not Applicable   Tyrell Antonio 05/24/18 1349

## 2018-05-28 ENCOUNTER — Emergency Department
Admission: EM | Admit: 2018-05-28 | Discharge: 2018-05-28 | Disposition: A | Discharge status: Home / Self Care | Payer: 59 | Source: Home / Self Care | Attending: Family Medicine | Admitting: Family Medicine

## 2018-05-28 ENCOUNTER — Other Ambulatory Visit: Payer: Self-pay

## 2018-05-28 DIAGNOSIS — J40 Bronchitis, not specified as acute or chronic: Secondary | ICD-10-CM | POA: Diagnosis not present

## 2018-05-28 MED ORDER — PREDNISONE 20 MG PO TABS: ORAL_TABLET | ORAL | 0 refills | Status: DC

## 2018-05-28 MED ORDER — BENZONATATE 200 MG PO CAPS: ORAL_CAPSULE | ORAL | 0 refills | Status: DC

## 2018-05-28 MED ORDER — AZITHROMYCIN 250 MG PO TABS: ORAL_TABLET | ORAL | 0 refills | Status: DC

## 2018-05-28 MED ORDER — METHYLPREDNISOLONE SODIUM SUCC 125 MG IJ SOLR
80.0000 mg | Freq: Once | INTRAMUSCULAR | Status: AC
  Administered 2018-05-28: 80 mg via INTRAMUSCULAR

## 2018-05-28 NOTE — ED Triage Notes (Signed)
Pt was seen here in UC on Wed. Neg flu test. Returns with worsening cough and nasal drainage with green mucous. No OTC meds helping.

## 2018-05-28 NOTE — ED Provider Notes (Signed)
[name]    CSN: [id number] Arrival date & time: 05/28/18  1430     History   Chief Complaint Chief Complaint  Patient presents with  . Cough    HPI Marissa [name] is a 57 y.o. female.   Patient was evaluated here four days ago for URI with a negative flu test.  Since then her cough has become worse and she has developed wheezing and shortness of breath with activity.  She feels tightness in her anterior chest.  She reports that she has a history of exercise induced asthma.  The history is provided by the patient.    Past Medical History:  Diagnosis Date  . Abnormal uterine bleeding    due to fibroids  . Anxiety   . Arthritis    knee, scheduled for knee replacement 04/2016  . Asthma    exercise induced-no inhalers  . Dysmenorrhea    2000  from fibroids  . Family history of adverse reaction to anesthesia 02/26/2016   father had shoulder replacement-admitted back to hospital for related complications  . Fibroid   . Genital warts 1994   Tx  x2  . GERD (gastroesophageal reflux disease)   . Head injury with loss of consciousness ([place]) 1986  . Hyperlipidemia   . Primary localized osteoarthrosis of the knee, right   . Sleep apnea    has a cpap-cannot use-makes her sick  . Unilateral post-traumatic osteoarthritis, right knee 04/21/2016    Patient Active Problem List   Diagnosis Date Noted  . Post-traumatic osteoarthritis of right knee 05/03/2016  . Unilateral post-traumatic osteoarthritis, right knee 04/21/2016  . Primary localized osteoarthrosis of the knee, right   . Colon polyp 03/23/2013  . Asthma, mild intermittent 01/25/2013  . Class 3 obesity without serious comorbidity with body mass index (BMI) of 40.0 to 44.9 in adult 01/25/2013  . Cough 08/16/2011  . Right hand pain 01/18/2011  . Obstructive sleep apnea 07/10/2010  . Intrinsic asthma 06/25/2010  . DYSPNEA ON EXERTION 05/14/2010  . Hyperlipidemia 07/14/2007  . GERD 07/14/2007  .  Diaphragmatic hernia 04/06/2007    Past Surgical History:  Procedure Laterality Date  . ABDOMINAL HYSTERECTOMY  09/11/2002   ovaries retained. 2o fibroids  . APPENDECTOMY  1982  . BREAST EXCISIONAL BIOPSY Left 09/1995  . BREAST SURGERY  1997   cyst left breast rem. benign  . COLONOSCOPY  06/2015   polyp recheck 5 yrs  . EXCISION VAGINAL CYST N/A 03/02/2016   Procedure: excision of vaginal polyp;  Surgeon: Nunzio Cobbs, MD;  Location: [facility];  Service: Gynecology;  Laterality: N/A;  . KNEE ARTHROSCOPY Right 07/16/2013   Procedure: RIGHT ARTHROSCOPY KNEE, partial lateral menisectomy and chrondromalasia, with microfracture of lateral femeral chondyl, and excision of plica ;  Surgeon: Kerin Salen, MD;  Location: [place];  Service: Orthopedics;  Laterality: Right;  . KNEE ARTHROSCOPY Right 07/05/2014  . LIPOMA EXCISION    . RECONSTRUCTION OF NOSE  1986  . SHOULDER ARTHROSCOPY WITH ROTATOR CUFF REPAIR AND SUBACROMIAL DECOMPRESSION Left 08/02/2014   Procedure: SHOULDER ARTHROSCOPY WITH ROTATOR CUFF REPAIR AND SUBACROMIAL DECOMPRESSION;  Surgeon: Hessie Dibble, MD;  Location: [place];  Service: Orthopedics;  Laterality: Left;  . SHOULDER SURGERY Right 2003   rt  . STERIOD INJECTION Left 08/02/2014   Procedure: LEFT THUMB INJECTION;  Surgeon: Hessie Dibble, MD;  Location: [place] [place];  Service: Orthopedics;  Laterality: Left;  .  TOTAL KNEE ARTHROPLASTY Right 05/03/2016   Procedure: RIGHT TOTAL KNEE ARTHROPLASTY;  Surgeon: Elsie Saas, MD;  Location: [place];  Service: Orthopedics;  Laterality: Right;    OB History    Gravida  0   Para      Term      Preterm      AB      Living        SAB      TAB      Ectopic      Multiple      Live Births               Home Medications    Prior to Admission medications   Medication Sig Start Date End Date Taking? Authorizing Provider  azithromycin (ZITHROMAX Z-PAK)  250 MG tablet Take 2 tabs today; then begin one tab once daily for 4 more days. 05/28/18   Kandra Nicolas, MD  benzonatate (TESSALON) 200 MG capsule Take one cap by mouth at bedtime as needed for cough.  May repeat in 4 to 6 hours 05/28/18   Kandra Nicolas, MD  ezetimibe (ZETIA) 10 MG tablet Take 10 mg by mouth as needed. 02/07/17   [provider]  ibuprofen (ADVIL,MOTRIN) 800 MG tablet as needed. 09/27/16   [provider]  Multiple Vitamin (THERA) TABS Take by mouth daily.    [provider]  NON FORMULARY Curamin - take one tablet twice daily    [provider]  omeprazole-sodium bicarbonate (ZEGERID) 40-1100 MG capsule Take by mouth as needed.    [provider]  predniSONE (DELTASONE) 20 MG tablet Take one tab by mouth twice daily for 4 days, then one daily for 3 days. Take with food. 05/28/18   Kandra Nicolas, MD    Family History Family History  Problem Relation Age of Onset  . Diabetes Mother   . Hyperlipidemia Mother   . Hypertension Mother   . Ulcers Mother   . Fibromyalgia Mother   . Sudden death Maternal Grandfather   . Cancer Maternal Grandmother   . Cancer Paternal Grandmother        breast/liver  . Breast cancer Paternal Grandmother   . Hyperlipidemia Father   . Seizures Father   . Hypertension Father   . Dementia Father   . Hyperlipidemia Brother   . Heart attack Neg Hx     Social History Social History   Tobacco Use  . Smoking status: Never Smoker  . Smokeless tobacco: Never Used  Substance Use Topics  . Alcohol use: Yes    Alcohol/week: 5.0 standard drinks    Types: 3 Glasses of wine, 1 Cans of beer, 1 Shots of liquor per week  . Drug use: No     Allergies   Statins and Hydrocodone   Review of Systems Review of Systems No sore throat + cough No pleuritic pain, but feels tight in anterior chest + wheezing + nasal congestion + post-nasal drainage + sinus pain/pressure No itchy/red eyes ?  earache No hemoptysis + SOB No fever, + chills No nausea No vomiting No abdominal pain No diarrhea No urinary symptoms No skin rash + fatigue + myalgias + headache Used OTC meds without relief   Physical Exam Triage Vital Signs ED Triage Vitals [05/28/18 1530]  Enc Vitals Group     BP (!) 145/98     Pulse Rate 66     Resp      Temp 98.5 F (36.9 C)  Temp Source Oral     SpO2 96 %     Weight 259 lb (117.5 kg)     Height 5\' 4"  (1.626 m)     Head Circumference      Peak Flow      Pain Score 0     Pain Loc      Pain Edu?      Excl. in [place]?    No data found.  Updated Vital Signs BP (!) 145/98 (BP Location: Right Arm)   Pulse 66   Temp 98.5 F (36.9 C) (Oral)   Ht 5\' 4"  (1.626 m)   Wt 117.5 kg   LMP 08/20/2002   SpO2 96%   BMI 44.46 kg/m   Visual Acuity Right Eye Distance:   Left Eye Distance:   Bilateral Distance:    Right Eye Near:   Left Eye Near:    Bilateral Near:     Physical Exam Nursing notes and Vital Signs reviewed. Appearance:  Patient appears stated age, and in no acute distress Eyes:  Pupils are equal, round, and reactive to light and accomodation.  Extraocular movement is intact.  Conjunctivae are not inflamed  Ears:  Canals normal.  Tympanic membranes normal.  Nose:  Mildly congested turbinates.  No sinus tenderness.   Pharynx:  Normal Neck:  Supple.  Enlarged posterior/lateral nodes are palpated bilaterally, tender to palpation on the left.   Lungs:  Clear to auscultation.  Breath sounds are equal.  Moving air well. Heart:  Regular rate and rhythm without murmurs, rubs, or gallops.  Abdomen:  Nontender without masses or hepatosplenomegaly.  Bowel sounds are present.  No CVA or flank tenderness.  Extremities:  No edema.  Skin:  No rash present.    UC Treatments / Results  Labs (all labs ordered are listed, but only abnormal results are displayed) Labs Reviewed - No data to display  EKG None  Radiology No results  found.  Procedures Procedures (including critical care time)  Medications Ordered in UC Medications  methylPREDNISolone sodium succinate (SOLU-MEDROL) 125 mg/2 mL injection 80 mg (has no administration in time range)    Initial Impression / Assessment and Plan / UC Course  I have reviewed the triage vital signs and the nursing notes.  Pertinent labs & imaging results that were available during my care of the patient were reviewed by me and considered in my medical decision making (see chart for details).    Begin Z-pak for atypical coverage.  Administered Solumedrol 80mg  IM.  Begin prednisone burst/taper. Prescription written for Benzonatate [facility]) to take at bedtime for night-time cough.  Followup with Family Doctor if not improved in about 8 days.   Final Clinical Impressions(s) / UC Diagnoses   Final diagnoses:  Bronchitis     Discharge Instructions     Begin prednisone Monday 05/29/18. Take plain guaifenesin (1200mg  extended release tabs such as Mucinex) twice daily, with plenty of water, for cough and congestion.  May add Pseudoephedrine (30mg , one or two every 4 to 6 hours) for sinus congestion.  Get adequate rest.   May use Afrin nasal spray (or generic oxymetazoline) each morning for about 5 days and then discontinue.  Also recommend using saline nasal spray several times daily and saline nasal irrigation (AYR is a common brand).  Use Flonase nasal spray each morning after using Afrin nasal spray and saline nasal irrigation. Try warm salt water gargles for sore throat.  Stop all antihistamines for now, and other non-prescription cough/cold preparations.  If needed, may take Delsym Cough Suppressant with Tessalon at bedtime for nighttime cough.     ED Prescriptions    Medication Sig Dispense Auth. Provider   azithromycin (ZITHROMAX Z-PAK) 250 MG tablet Take 2 tabs today; then begin one tab once daily for 4 more days. 6 tablet Kandra Nicolas, MD   predniSONE  (DELTASONE) 20 MG tablet Take one tab by mouth twice daily for 4 days, then one daily for 3 days. Take with food. 11 tablet Kandra Nicolas, MD   benzonatate (TESSALON) 200 MG capsule Take one cap by mouth at bedtime as needed for cough.  May repeat in 4 to 6 hours 15 capsule Assunta Found Ishmael Holter, MD        Kandra Nicolas, MD 05/30/18 [id number]

## 2018-05-28 NOTE — Discharge Instructions (Addendum)
Begin prednisone Monday 05/29/18. Take plain guaifenesin (1200mg  extended release tabs such as Mucinex) twice daily, with plenty of water, for cough and congestion.  May add Pseudoephedrine (30mg , one or two every 4 to 6 hours) for sinus congestion.  Get adequate rest.   May use Afrin nasal spray (or generic oxymetazoline) each morning for about 5 days and then discontinue.  Also recommend using saline nasal spray several times daily and saline nasal irrigation (AYR is a common brand).  Use Flonase nasal spray each morning after using Afrin nasal spray and saline nasal irrigation. Try warm salt water gargles for sore throat.  Stop all antihistamines for now, and other non-prescription cough/cold preparations. If needed, may take Delsym Cough Suppressant with Tessalon at bedtime for nighttime cough.

## 2018-07-24 NOTE — Patient Instructions (Addendum)
Marissa [name]  07/24/2018   Your procedure is scheduled on: 08-02-18    Report to [facility] Main  Entrance    Report to Admitting at 7:00 AM    Call this number if you have problems the morning of surgery [phone]    Remember: Do not eat food or drink liquids :After Midnight.    BRUSH YOUR TEETH MORNING OF SURGERY AND RINSE YOUR MOUTH OUT, NO CHEWING GUM CANDY OR MINTS.     Take these medicines the morning of surgery with A SIP OF WATER: Ezetimibe (Zetia), Omeprazole- Sodium Bacarbonate (Zegerid) if needed                                You may not have any metal on your body including hair pins and              piercings  Do not wear jewelry, make-up, lotions, powders or perfumes, deodorant             Do not wear nail polish.  Do not shave  48 hours prior to surgery.                 Do not bring valuables to the hospital. [place].  Contacts, dentures or bridgework may not be worn into surgery.  Leave suitcase in the car. After surgery it may be brought to your room.     Patients discharged the day of surgery will not be allowed to drive home. IF YOU ARE HAVING SURGERY AND GOING HOME THE SAME DAY, YOU MUST HAVE AN ADULT TO DRIVE YOU HOME AND BE WITH YOU FOR 24 HOURS. YOU MAY GO HOME BY TAXI OR UBER OR ORTHERWISE, BUT AN ADULT MUST ACCOMPANY YOU HOME AND STAY WITH YOU FOR 24 HOURS.    Special Instructions: N/A              Please read over the following fact sheets you were given: _____________________________________________________________________             [facility] - Preparing for Surgery Before surgery, you can play an important role.  Because skin is not sterile, your skin needs to be as free of germs as possible.  You can reduce the number of germs on your skin by washing with CHG (chlorahexidine gluconate) soap before surgery.  CHG is an antiseptic cleaner which kills germs and  bonds with the skin to continue killing germs even after washing. Please DO NOT use if you have an allergy to CHG or antibacterial soaps.  If your skin becomes reddened/irritated stop using the CHG and inform your nurse when you arrive at Short Stay. Do not shave (including legs and underarms) for at least 48 hours prior to the first CHG shower.  You may shave your face/neck. Please follow these instructions carefully:  1.  Shower with CHG Soap the night before surgery and the  morning of Surgery.  2.  If you choose to wash your hair, wash your hair first as usual with your  normal  shampoo.  3.  After you shampoo, rinse your hair and body thoroughly to remove the  shampoo.  4.  Use CHG as you would any other liquid soap.  You can apply chg directly  to the skin and wash                       Gently with a scrungie or clean washcloth.  5.  Apply the CHG Soap to your body ONLY FROM THE NECK DOWN.   Do not use on face/ open                           Wound or open sores. Avoid contact with eyes, ears mouth and genitals (private parts).                       Wash face,  Genitals (private parts) with your normal soap.             6.  Wash thoroughly, paying special attention to the area where your surgery  will be performed.  7.  Thoroughly rinse your body with warm water from the neck down.  8.  DO NOT shower/wash with your normal soap after using and rinsing off  the CHG Soap.                9.  Pat yourself dry with a clean towel.            10.  Wear clean pajamas.            11.  Place clean sheets on your bed the night of your first shower and do not  sleep with pets. Day of Surgery : Do not apply any lotions/deodorants the morning of surgery.  Please wear clean clothes to the hospital/surgery center.  FAILURE TO FOLLOW THESE INSTRUCTIONS MAY RESULT IN THE CANCELLATION OF YOUR SURGERY PATIENT SIGNATURE_________________________________  NURSE  SIGNATURE__________________________________  ________________________________________________________________________   Adam Phenix  An incentive spirometer is a tool that can help keep your lungs clear and active. This tool measures how well you are filling your lungs with each breath. Taking long deep breaths may help reverse or decrease the chance of developing breathing (pulmonary) problems (especially infection) following:  A long period of time when you are unable to move or be active. BEFORE THE PROCEDURE   If the spirometer includes an indicator to show your best effort, your nurse or respiratory therapist will set it to a desired goal.  If possible, sit up straight or lean slightly forward. Try not to slouch.  Hold the incentive spirometer in an upright position. INSTRUCTIONS FOR USE  1. Sit on the edge of your bed if possible, or sit up as far as you can in bed or on a chair. 2. Hold the incentive spirometer in an upright position. 3. Breathe out normally. 4. Place the mouthpiece in your mouth and seal your lips tightly around it. 5. Breathe in slowly and as deeply as possible, raising the piston or the ball toward the top of the column. 6. Hold your breath for 3-5 seconds or for as long as possible. Allow the piston or ball to fall to the bottom of the column. 7. Remove the mouthpiece from your mouth and breathe out normally. 8. Rest for a few seconds and repeat Steps 1 through 7 at least 10 times every 1-2 hours when you are awake. Take your time and take a few normal breaths between deep breaths. 9. The spirometer may include an indicator to show  your best effort. Use the indicator as a goal to work toward during each repetition. 10. After each set of 10 deep breaths, practice coughing to be sure your lungs are clear. If you have an incision (the cut made at the time of surgery), support your incision when coughing by placing a pillow or rolled up towels firmly  against it. Once you are able to get out of bed, walk around indoors and cough well. You may stop using the incentive spirometer when instructed by your caregiver.  RISKS AND COMPLICATIONS  Take your time so you do not get dizzy or light-headed.  If you are in pain, you may need to take or ask for pain medication before doing incentive spirometry. It is harder to take a deep breath if you are having pain. AFTER USE  Rest and breathe slowly and easily.  It can be helpful to keep track of a log of your progress. Your caregiver can provide you with a simple table to help with this. If you are using the spirometer at home, follow these instructions: [place] IF:   You are having difficultly using the spirometer.  You have trouble using the spirometer as often as instructed.  Your pain medication is not giving enough relief while using the spirometer.  You develop fever of 100.5 F (38.1 C) or higher. SEEK IMMEDIATE MEDICAL CARE IF:   You cough up bloody sputum that had not been present before.  You develop fever of 102 F (38.9 C) or greater.  You develop worsening pain at or near the incision site. MAKE SURE YOU:   Understand these instructions.  Will watch your condition.  Will get help right away if you are not doing well or get worse. Document Released: 10/18/2006 Document Revised: 08/30/2011 Document Reviewed: 12/19/2006 ExitCare Patient Information 2014 ExitCare, [place].   ________________________________________________________________________  WHAT IS A BLOOD TRANSFUSION? Blood Transfusion Information  A transfusion is the replacement of blood or some of its parts. Blood is made up of multiple cells which provide different functions.  Red blood cells carry oxygen and are used for blood loss replacement.  White blood cells fight against infection.  Platelets control bleeding.  Plasma helps clot blood.  Other blood products are available for  specialized needs, such as hemophilia or other clotting disorders. BEFORE THE TRANSFUSION  Who gives blood for transfusions?   Healthy volunteers who are fully evaluated to make sure their blood is safe. This is blood bank blood. Transfusion therapy is the safest it has ever been in the practice of medicine. Before blood is taken from a donor, a complete history is taken to make sure that person has no history of diseases nor engages in risky social behavior (examples are intravenous drug use or sexual activity with multiple partners). The donor's travel history is screened to minimize risk of transmitting infections, such as malaria. The donated blood is tested for signs of infectious diseases, such as HIV and hepatitis. The blood is then tested to be sure it is compatible with you in order to minimize the chance of a transfusion reaction. If you or a relative donates blood, this is often done in anticipation of surgery and is not appropriate for emergency situations. It takes many days to process the donated blood. RISKS AND COMPLICATIONS Although transfusion therapy is very safe and saves many lives, the main dangers of transfusion include:   Getting an infectious disease.  Developing a transfusion reaction. This is an allergic reaction to  something in the blood you were given. Every precaution is taken to prevent this. The decision to have a blood transfusion has been considered carefully by your caregiver before blood is given. Blood is not given unless the benefits outweigh the risks. AFTER THE TRANSFUSION  Right after receiving a blood transfusion, you will usually feel much better and more energetic. This is especially true if your red blood cells have gotten low (anemic). The transfusion raises the level of the red blood cells which carry oxygen, and this usually causes an energy increase.  The nurse administering the transfusion will monitor you carefully for complications. HOME CARE  INSTRUCTIONS  No special instructions are needed after a transfusion. You may find your energy is better. Speak with your caregiver about any limitations on activity for underlying diseases you may have. SEEK MEDICAL CARE IF:   Your condition is not improving after your transfusion.  You develop redness or irritation at the intravenous (IV) site. SEEK IMMEDIATE MEDICAL CARE IF:  Any of the following symptoms occur over the next 12 hours:  Shaking chills.  You have a temperature by mouth above 102 F (38.9 C), not controlled by medicine.  Chest, back, or muscle pain.  People around you feel you are not acting correctly or are confused.  Shortness of breath or difficulty breathing.  Dizziness and fainting.  You get a rash or develop hives.  You have a decrease in urine output.  Your urine turns a dark color or changes to pink, red, or brown. Any of the following symptoms occur over the next 10 days:  You have a temperature by mouth above 102 F (38.9 C), not controlled by medicine.  Shortness of breath.  Weakness after normal activity.  The white part of the eye turns yellow (jaundice).  You have a decrease in the amount of urine or are urinating less often.  Your urine turns a dark color or changes to pink, red, or brown. Document Released: 06/04/2000 Document Revised: 08/30/2011 Document Reviewed: 01/22/2008 [facility] Patient Information 2014 [place], [place].  _______________________________________________________________________

## 2018-07-26 ENCOUNTER — Other Ambulatory Visit: Payer: Self-pay

## 2018-07-26 ENCOUNTER — Encounter (HOSPITAL_COMMUNITY)
Admission: RE | Admit: 2018-07-26 | Discharge: 2018-07-26 | Disposition: A | Discharge status: Home / Self Care | Payer: No Typology Code available for payment source | Source: Ambulatory Visit | Attending: Orthopedic Surgery | Admitting: Orthopedic Surgery

## 2018-07-26 ENCOUNTER — Encounter (HOSPITAL_COMMUNITY): Payer: Self-pay

## 2018-07-26 DIAGNOSIS — Z01812 Encounter for preprocedural laboratory examination: Secondary | ICD-10-CM | POA: Insufficient documentation

## 2018-07-26 HISTORY — DX: Nausea with vomiting, unspecified: Z98.890

## 2018-07-26 HISTORY — DX: Nausea with vomiting, unspecified: R11.2

## 2018-07-26 HISTORY — DX: Adverse effect of unspecified anesthetic, initial encounter: T41.45XA

## 2018-07-26 HISTORY — DX: Other complications of anesthesia, initial encounter: T88.59XA

## 2018-07-26 LAB — COMPREHENSIVE METABOLIC PANEL
ALT: 44 U/L (ref 0–44)
ANION GAP: 7 (ref 5–15)
AST: 31 U/L (ref 15–41)
Albumin: 4.4 g/dL (ref 3.5–5.0)
Alkaline Phosphatase: 76 U/L (ref 38–126)
BUN: 17 mg/dL (ref 6–20)
CO2: 28 mmol/L (ref 22–32)
Calcium: 9.5 mg/dL (ref 8.9–10.3)
Chloride: 104 mmol/L (ref 98–111)
Creatinine, Ser: 0.94 mg/dL (ref 0.44–1.00)
GFR calc Af Amer: >60 mL/min (ref >60)
GFR calc non Af Amer: >60 mL/min (ref >60)
Glucose, Bld: 151 mg/dL — ABNORMAL HIGH (ref 70–99)
POTASSIUM: 3.7 mmol/L (ref 3.5–5.1)
Sodium: 139 mmol/L (ref 135–145)
Total Bilirubin: 0.5 mg/dL (ref 0.3–1.2)
Total Protein: 6.8 g/dL (ref 6.5–8.1)

## 2018-07-26 LAB — CBC
HCT: 41.8 % (ref 36.0–46.0)
Hemoglobin: 13.8 g/dL (ref 12.0–15.0)
MCH: 32.4 pg (ref 26.0–34.0)
MCHC: 33 g/dL (ref 30.0–36.0)
MCV: 98.1 fL (ref 80.0–100.0)
PLATELETS: 281 10*3/uL (ref 150–400)
RBC: 4.26 MIL/uL (ref 3.87–5.11)
RDW: 12.1 % (ref 11.5–15.5)
WBC: 6.4 10*3/uL (ref 4.0–10.5)
nRBC: 0 % (ref 0.0–0.2)

## 2018-07-26 LAB — PROTIME-INR
INR: 0.96
Prothrombin Time: 12.7 seconds (ref 11.4–15.2)

## 2018-07-26 LAB — SURGICAL PCR SCREEN
MRSA, PCR: NEGATIVE
Staphylococcus aureus: NEGATIVE

## 2018-07-26 LAB — APTT: aPTT: 31 seconds (ref 24–36)

## 2018-07-26 LAB — ABO/RH: ABO/RH(D): A POS

## 2018-07-26 NOTE — H&P (Signed)
TOTAL KNEE POLYETHYLENE REVISION ADMISSION H&P  Patient is being admitted for right total knee arthroplasty polyethylene revision  Subjective:  Chief Complaint: Painful right TKA  HPI: Marissa [name] is a 58 year old female who presents for pre-operative visit in preparation for their right knee polyethylene revision, which is scheduled on 08-02-2018 with Dr. Wynelle Link at [facility]. The patient has had symptoms in the right knee including pain and instability which has impacted their quality of life and ability to do activities of daily living. The patient currently has a diagnosis of right knee instability and has failed conservative treatments including activity modification. Original total knee arthroplasty was in 2017, symptoms have occurred since surgery.The patient has had previous arthroplasty on the right knee. The patient denies an active infection.  Patient Active Problem List   Diagnosis Date Noted  . Post-traumatic osteoarthritis of right knee 05/03/2016  . Unilateral post-traumatic osteoarthritis, right knee 04/21/2016  . Primary localized osteoarthrosis of the knee, right   . Colon polyp 03/23/2013  . Asthma, mild intermittent 01/25/2013  . Class 3 obesity without serious comorbidity with body mass index (BMI) of 40.0 to 44.9 in adult 01/25/2013  . Cough 08/16/2011  . Right hand pain 01/18/2011  . Obstructive sleep apnea 07/10/2010  . Intrinsic asthma 06/25/2010  . DYSPNEA ON EXERTION 05/14/2010  . Hyperlipidemia 07/14/2007  . GERD 07/14/2007  . Diaphragmatic hernia 04/06/2007   Past Medical History:  Diagnosis Date  . Abnormal uterine bleeding    due to fibroids  . Anxiety   . Arthritis    knee, scheduled for knee replacement 04/2016  . Asthma    exercise induced-no inhalers  . Dysmenorrhea    2000  from fibroids  . Family history of adverse reaction to anesthesia 02/26/2016   father had shoulder replacement-admitted back to hospital for related  complications  . Fibroid   . Genital warts 1994   Tx  x2  . GERD (gastroesophageal reflux disease)   . Head injury with loss of consciousness ([place]) 1986  . Hyperlipidemia   . Primary localized osteoarthrosis of the knee, right   . Sleep apnea    has a cpap-cannot use-makes her sick  . Unilateral post-traumatic osteoarthritis, right knee 04/21/2016    Past Surgical History:  Procedure Laterality Date  . ABDOMINAL HYSTERECTOMY  09/11/2002   ovaries retained. 2o fibroids  . APPENDECTOMY  1982  . BREAST EXCISIONAL BIOPSY Left 09/1995  . BREAST SURGERY  1997   cyst left breast rem. benign  . COLONOSCOPY  06/2015   polyp recheck 5 yrs  . EXCISION VAGINAL CYST N/A 03/02/2016   Procedure: excision of vaginal polyp;  Surgeon: Nunzio Cobbs, MD;  Location: [facility];  Service: Gynecology;  Laterality: N/A;  . KNEE ARTHROSCOPY Right 07/16/2013   Procedure: RIGHT ARTHROSCOPY KNEE, partial lateral menisectomy and chrondromalasia, with microfracture of lateral femeral chondyl, and excision of plica ;  Surgeon: Kerin Salen, MD;  Location: [place];  Service: Orthopedics;  Laterality: Right;  . KNEE ARTHROSCOPY Right 07/05/2014  . LIPOMA EXCISION    . RECONSTRUCTION OF NOSE  1986  . SHOULDER ARTHROSCOPY WITH ROTATOR CUFF REPAIR AND SUBACROMIAL DECOMPRESSION Left 08/02/2014   Procedure: SHOULDER ARTHROSCOPY WITH ROTATOR CUFF REPAIR AND SUBACROMIAL DECOMPRESSION;  Surgeon: Hessie Dibble, MD;  Location: [place];  Service: Orthopedics;  Laterality: Left;  . SHOULDER SURGERY Right 2003   rt  . STERIOD INJECTION Left 08/02/2014  Procedure: LEFT THUMB INJECTION;  Surgeon: Hessie Dibble, MD;  Location: [place];  Service: Orthopedics;  Laterality: Left;  . TOTAL KNEE ARTHROPLASTY Right 05/03/2016   Procedure: RIGHT TOTAL KNEE ARTHROPLASTY;  Surgeon: Elsie Saas, MD;  Location: [place];  Service: Orthopedics;  Laterality: Right;    No current  facility-administered medications for this encounter.    Current Outpatient Medications  Medication Sig Dispense Refill Last Dose  . acetaminophen (TYLENOL) 500 MG tablet Take 1,000 mg by mouth every 6 (six) hours as needed for moderate pain or headache.     . calcium carbonate (TUMS EX) 750 MG chewable tablet Chew 1 tablet by mouth at bedtime.     Marland Kitchen ezetimibe (ZETIA) 10 MG tablet Take 10 mg by mouth daily.    Taking  . ibuprofen (ADVIL,MOTRIN) 800 MG tablet Take 800 mg by mouth every 8 (eight) hours as needed for headache or moderate pain.    Taking  . Multiple Vitamins-Minerals (MULTIVITAMIN WOMEN PO) Take 1 tablet by mouth daily.     Earney Navy Bicarbonate (ZEGERID OTC) 20-1100 MG CAPS capsule Take 1 capsule by mouth daily as needed (for acid reflux).      Allergies  Allergen Reactions  . Statins Itching, Rash and Other (See Comments)    Muscle and joint  . Hydrocodone Nausea Only and Other (See Comments)    Can take codeine without problem When taken on empty stomach Patient states she is not allergic and the one time it caused nausea she had taken on an empty stomach.    Social History   Tobacco Use  . Smoking status: Never Smoker  . Smokeless tobacco: Never Used  Substance Use Topics  . Alcohol use: Yes    Alcohol/week: 5.0 standard drinks    Types: 3 Glasses of wine, 1 Cans of beer, 1 Shots of liquor per week    Family History  Problem Relation Age of Onset  . Diabetes Mother   . Hyperlipidemia Mother   . Hypertension Mother   . Ulcers Mother   . Fibromyalgia Mother   . Sudden death Maternal Grandfather   . Cancer Maternal Grandmother   . Cancer Paternal Grandmother        breast/liver  . Breast cancer Paternal Grandmother   . Hyperlipidemia Father   . Seizures Father   . Hypertension Father   . Dementia Father   . Hyperlipidemia Brother   . Heart attack Neg Hx      Review of Systems  Constitutional: Negative for chills and fever.  HENT: Negative  for congestion, sore throat and tinnitus.   Eyes: Negative for double vision, photophobia and pain.  Respiratory: Negative for cough, shortness of breath and wheezing.   Cardiovascular: Negative for chest pain, palpitations and orthopnea.  Gastrointestinal: Negative for heartburn, nausea and vomiting.  Genitourinary: Negative for dysuria, frequency and urgency.  Musculoskeletal: Positive for joint pain.  Neurological: Negative for dizziness, weakness and headaches.    Objective:  Physical Exam  Well nourished and well developed.  General: Alert and oriented x3, cooperative and pleasant, no acute distress.  Head: normocephalic, atraumatic, neck supple.  Eyes: EOMI.  Respiratory: breath sounds clear in all fields, no wheezing, rales, or rhonchi. Cardiovascular: Regular rate and rhythm, no murmurs, gallops or rubs.  Abdomen: non-tender to palpation and soft, normoactive bowel sounds. Musculoskeletal:  Right knee exam: No swelling. No tenderness. Range of motion: 0 - 120 degrees. There is varus/valgus laxity in extension and A/P laxity  in flexion. No gross instability.   Calves soft and nontender. Motor function intact in LE. Strength 5/5 LE bilaterally. Neuro: Distal pulses 2+. Sensation to light touch intact in LE.  Vital signs in last 24 hours: Temp:  [98.1 F (36.7 C)] 98.1 F (36.7 C) (02/05 0900) Pulse Rate:  [74] 74 (02/05 0900) Resp:  [18] 18 (02/05 0900) BP: (156)/(84) 156/84 (02/05 0900) SpO2:  [100 %] 100 % (02/05 0900) Weight:  [121.1 kg] 121.1 kg (02/05 0900)  Labs:   Estimated body mass index is 45.83 kg/m as calculated from the following:   Height as of 07/26/18: 5\' 4"  (1.626 m).   Weight as of 07/26/18: 121.1 kg.   Imaging Review Bone scan of the right knee was negative for any loosening of the prosthesis.    Preoperative templating of the joint replacement has been completed, documented, and submitted to the Operating Room personnel in order to optimize  intra-operative equipment management.    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 The treatment options including medical management, injection therapy arthroscopy and arthroplasty were discussed at length. The risks and benefits of total knee arthroplasty were presented and reviewed. The risks due to aseptic loosening, infection, stiffness, patella tracking problems, thromboembolic complications and other imponderables were discussed. The patient acknowledged the explanation, agreed to proceed with the plan and consent was signed. Patient is being admitted for inpatient treatment for surgery, pain control, PT, OT, prophylactic antibiotics, VTE prophylaxis, progressive ambulation and ADL's and discharge planning. The patient is planning to be discharged home.  Therapy Plans: outpatient therapy at [facility] Disposition: Home with boyfriend Planned DVT Prophylaxis: Aspirin 325 mg BID DME needed: None PCP: Ronie Spies, MD TXA: IV Allergies: NKDA Anesthesia Concerns: Nausea/vomiting   - Patient was instructed on what medications to stop prior to surgery. - Follow-up visit in 2 weeks with Dr. Wynelle Link - Begin physical therapy following surgery - Pre-operative lab work as pre-surgical testing - Prescriptions will be provided in hospital at time of discharge  Theresa Duty, PA-C Orthopedic Surgery [facility] [place]

## 2018-07-26 NOTE — Progress Notes (Signed)
07-14-18 Surgical Clearance from Dr. Gilman Schmidt, CXR, EKG and [facility] on chart

## 2018-08-01 MED ORDER — DEXTROSE 5 % IV SOLN
3.0000 g | INTRAVENOUS | Status: AC
  Administered 2018-08-02: 3 g via INTRAVENOUS
  Filled 2018-08-01: qty 3

## 2018-08-01 MED ORDER — BUPIVACAINE LIPOSOME 1.3 % IJ SUSP
20.0000 mL | INTRAMUSCULAR | Status: DC
  Filled 2018-08-01: qty 20

## 2018-08-01 NOTE — Anesthesia Preprocedure Evaluation (Addendum)
Anesthesia Evaluation  Patient identified by MRN, date of birth, ID band Patient awake    Reviewed: Allergy & Precautions, NPO status , Patient's Chart, lab work & pertinent test results  History of Anesthesia Complications (+) PONV and history of anesthetic complications  Airway Mallampati: III  TM Distance: >3 FB Neck ROM: Full    Dental  (+) Dental Advisory Given, Teeth Intact   Pulmonary asthma , sleep apnea ,    breath sounds clear to auscultation       Cardiovascular (-) anginanegative cardio ROS   Rhythm:Regular Rate:Normal     Neuro/Psych PSYCHIATRIC DISORDERS Anxiety  Neuromuscular disease    GI/Hepatic Neg liver ROS, GERD  Medicated and Controlled,  Endo/Other  Morbid obesity  Renal/GU negative Renal ROS     Musculoskeletal  (+) Arthritis , Osteoarthritis,    Abdominal (+) + obese,   Peds  Hematology negative hematology ROS (+)   Anesthesia Other Findings Genital warts  Reproductive/Obstetrics                            Anesthesia Physical Anesthesia Plan  ASA: III  Anesthesia Plan: Spinal   Post-op Pain Management:  Regional for Post-op pain   Induction:   PONV Risk Score and Plan: 3 and Treatment may vary due to age or medical condition, Propofol infusion, Ondansetron and Midazolam  Airway Management Planned: Natural Airway and Simple Face Mask  Additional Equipment: None  Intra-op Plan:   Post-operative Plan:   Informed Consent: I have reviewed the patients History and Physical, chart, labs and discussed the procedure including the risks, benefits and alternatives for the proposed anesthesia with the patient or authorized representative who has indicated his/her understanding and acceptance.       Plan Discussed with: CRNA and Anesthesiologist  Anesthesia Plan Comments: (Labs reviewed, platelets acceptable. Discussed risks and benefits of spinal, including  spinal/epidural hematoma, infection, failed block, and PDPH. Patient expressed understanding and wished to proceed. )       Anesthesia Quick Evaluation

## 2018-08-02 ENCOUNTER — Other Ambulatory Visit: Payer: Self-pay

## 2018-08-02 ENCOUNTER — Encounter (HOSPITAL_COMMUNITY)
Admission: RE | Disposition: A | Discharge status: Home / Self Care | Payer: Self-pay | Source: Home / Self Care | Attending: Orthopedic Surgery

## 2018-08-02 ENCOUNTER — Inpatient Hospital Stay (HOSPITAL_COMMUNITY): Payer: No Typology Code available for payment source | Admitting: Anesthesiology

## 2018-08-02 ENCOUNTER — Encounter (HOSPITAL_COMMUNITY): Payer: Self-pay | Admitting: Anesthesiology

## 2018-08-02 ENCOUNTER — Inpatient Hospital Stay (HOSPITAL_COMMUNITY): Payer: No Typology Code available for payment source | Admitting: Physician Assistant

## 2018-08-02 ENCOUNTER — Inpatient Hospital Stay (HOSPITAL_COMMUNITY)
Admission: RE | Admit: 2018-08-02 | Discharge: 2018-08-03 | DRG: 488 | Disposition: A | Discharge status: Home / Self Care | Payer: No Typology Code available for payment source | Attending: Orthopedic Surgery | Admitting: Orthopedic Surgery

## 2018-08-02 DIAGNOSIS — K449 Diaphragmatic hernia without obstruction or gangrene: Secondary | ICD-10-CM | POA: Diagnosis present

## 2018-08-02 DIAGNOSIS — T84018A Broken internal joint prosthesis, other site, initial encounter: Secondary | ICD-10-CM

## 2018-08-02 DIAGNOSIS — G4733 Obstructive sleep apnea (adult) (pediatric): Secondary | ICD-10-CM | POA: Diagnosis present

## 2018-08-02 DIAGNOSIS — M25561 Pain in right knee: Secondary | ICD-10-CM | POA: Diagnosis present

## 2018-08-02 DIAGNOSIS — Z96659 Presence of unspecified artificial knee joint: Secondary | ICD-10-CM

## 2018-08-02 DIAGNOSIS — Z6841 Body Mass Index (BMI) 40.0 and over, adult: Secondary | ICD-10-CM

## 2018-08-02 DIAGNOSIS — Z888 Allergy status to other drugs, medicaments and biological substances status: Secondary | ICD-10-CM | POA: Diagnosis not present

## 2018-08-02 DIAGNOSIS — E785 Hyperlipidemia, unspecified: Secondary | ICD-10-CM | POA: Diagnosis present

## 2018-08-02 DIAGNOSIS — T8484XA Pain due to internal orthopedic prosthetic devices, implants and grafts, initial encounter: Secondary | ICD-10-CM | POA: Diagnosis present

## 2018-08-02 DIAGNOSIS — Y792 Prosthetic and other implants, materials and accessory orthopedic devices associated with adverse incidents: Secondary | ICD-10-CM | POA: Diagnosis present

## 2018-08-02 DIAGNOSIS — K219 Gastro-esophageal reflux disease without esophagitis: Secondary | ICD-10-CM | POA: Diagnosis present

## 2018-08-02 HISTORY — PX: TOTAL KNEE REVISION: SHX996

## 2018-08-02 LAB — TYPE AND SCREEN
ABO/RH(D): A POS
Antibody Screen: NEGATIVE

## 2018-08-02 SURGERY — TOTAL KNEE REVISION
Anesthesia: Spinal | Site: Knee | Laterality: Right

## 2018-08-02 MED ORDER — PROMETHAZINE HCL 25 MG/ML IJ SOLN: 6.2500 mg | INTRAMUSCULAR | Status: DC | PRN

## 2018-08-02 MED ORDER — MENTHOL 3 MG MT LOZG: 1.0000 | LOZENGE | OROMUCOSAL | Status: DC | PRN

## 2018-08-02 MED ORDER — SODIUM CHLORIDE 0.9 % IV SOLN
INTRAVENOUS | Status: DC
  Administered 2018-08-02 – 2018-08-03 (×2): via INTRAVENOUS

## 2018-08-02 MED ORDER — DEXAMETHASONE SODIUM PHOSPHATE 10 MG/ML IJ SOLN
10.0000 mg | Freq: Once | INTRAMUSCULAR | Status: AC
  Administered 2018-08-03: 10 mg via INTRAVENOUS
  Filled 2018-08-02: qty 1

## 2018-08-02 MED ORDER — SODIUM CHLORIDE (PF) 0.9 % IJ SOLN
INTRAMUSCULAR | Status: DC | PRN
  Administered 2018-08-02: 60 mL

## 2018-08-02 MED ORDER — 0.9 % SODIUM CHLORIDE (POUR BTL) OPTIME
TOPICAL | Status: DC | PRN
  Administered 2018-08-02: 1000 mL

## 2018-08-02 MED ORDER — MIDAZOLAM HCL 2 MG/2ML IJ SOLN
INTRAMUSCULAR | Status: DC | PRN
  Administered 2018-08-02 (×2): 1 mg via INTRAVENOUS

## 2018-08-02 MED ORDER — FENTANYL CITRATE (PF) 100 MCG/2ML IJ SOLN
50.0000 ug | INTRAMUSCULAR | Status: DC
  Administered 2018-08-02: 50 ug via INTRAVENOUS
  Administered 2018-08-02 (×2): 25 ug via INTRAVENOUS
  Filled 2018-08-02: qty 2

## 2018-08-02 MED ORDER — METHOCARBAMOL 500 MG IVPB - SIMPLE MED
500.0000 mg | Freq: Four times a day (QID) | INTRAVENOUS | Status: DC | PRN
  Administered 2018-08-02: 500 mg via INTRAVENOUS
  Filled 2018-08-02: qty 50

## 2018-08-02 MED ORDER — PANTOPRAZOLE SODIUM 40 MG PO TBEC
40.0000 mg | DELAYED_RELEASE_TABLET | Freq: Every day | ORAL | Status: DC
  Administered 2018-08-03: 40 mg via ORAL
  Filled 2018-08-02: qty 1

## 2018-08-02 MED ORDER — DOCUSATE SODIUM 100 MG PO CAPS
100.0000 mg | ORAL_CAPSULE | Freq: Two times a day (BID) | ORAL | Status: DC
  Administered 2018-08-02 – 2018-08-03 (×2): 100 mg via ORAL
  Filled 2018-08-02 (×2): qty 1

## 2018-08-02 MED ORDER — CEFAZOLIN SODIUM-DEXTROSE 2-4 GM/100ML-% IV SOLN
2.0000 g | Freq: Four times a day (QID) | INTRAVENOUS | Status: AC
  Administered 2018-08-02 (×2): 2 g via INTRAVENOUS
  Filled 2018-08-02 (×3): qty 100

## 2018-08-02 MED ORDER — BISACODYL 10 MG RE SUPP: 10.0000 mg | Freq: Every day | RECTAL | Status: DC | PRN

## 2018-08-02 MED ORDER — PHENYLEPHRINE HCL 10 MG/ML IJ SOLN
INTRAMUSCULAR | Status: AC
  Filled 2018-08-02: qty 1

## 2018-08-02 MED ORDER — BUPIVACAINE LIPOSOME 1.3 % IJ SUSP
INTRAMUSCULAR | Status: DC | PRN
  Administered 2018-08-02: 20 mL

## 2018-08-02 MED ORDER — LACTATED RINGERS IV SOLN
INTRAVENOUS | Status: DC
  Administered 2018-08-02 (×2): via INTRAVENOUS

## 2018-08-02 MED ORDER — PHENOL 1.4 % MT LIQD: 1.0000 | OROMUCOSAL | Status: DC | PRN

## 2018-08-02 MED ORDER — METHOCARBAMOL 500 MG PO TABS
500.0000 mg | ORAL_TABLET | Freq: Four times a day (QID) | ORAL | Status: DC | PRN
  Administered 2018-08-02 – 2018-08-03 (×3): 500 mg via ORAL
  Filled 2018-08-02 (×3): qty 1

## 2018-08-02 MED ORDER — METOCLOPRAMIDE HCL 5 MG/ML IJ SOLN: 5.0000 mg | Freq: Three times a day (TID) | INTRAMUSCULAR | Status: DC | PRN

## 2018-08-02 MED ORDER — BUPIVACAINE IN DEXTROSE 0.75-8.25 % IT SOLN
INTRATHECAL | Status: DC | PRN
  Administered 2018-08-02: 1.8 mL via INTRATHECAL

## 2018-08-02 MED ORDER — EZETIMIBE 10 MG PO TABS
10.0000 mg | ORAL_TABLET | Freq: Every day | ORAL | Status: DC
  Administered 2018-08-03: 10 mg via ORAL
  Filled 2018-08-02: qty 1

## 2018-08-02 MED ORDER — MIDAZOLAM HCL 2 MG/2ML IJ SOLN
INTRAMUSCULAR | Status: AC
  Filled 2018-08-02: qty 2

## 2018-08-02 MED ORDER — PROPOFOL 500 MG/50ML IV EMUL
INTRAVENOUS | Status: DC | PRN
  Administered 2018-08-02: 55 ug/kg/min via INTRAVENOUS

## 2018-08-02 MED ORDER — SODIUM CHLORIDE 0.9 % IV SOLN
INTRAVENOUS | Status: DC | PRN
  Administered 2018-08-02: 20 ug/min via INTRAVENOUS

## 2018-08-02 MED ORDER — SODIUM CHLORIDE (PF) 0.9 % IJ SOLN
INTRAMUSCULAR | Status: AC
  Filled 2018-08-02: qty 50

## 2018-08-02 MED ORDER — CHLORHEXIDINE GLUCONATE 4 % EX LIQD: 60.0000 mL | Freq: Once | CUTANEOUS | Status: DC

## 2018-08-02 MED ORDER — TRAMADOL HCL 50 MG PO TABS
50.0000 mg | ORAL_TABLET | Freq: Four times a day (QID) | ORAL | Status: DC | PRN
  Administered 2018-08-02 – 2018-08-03 (×2): 100 mg via ORAL
  Filled 2018-08-02 (×2): qty 2

## 2018-08-02 MED ORDER — ACETAMINOPHEN 10 MG/ML IV SOLN
1000.0000 mg | Freq: Four times a day (QID) | INTRAVENOUS | Status: DC
  Administered 2018-08-02: 1000 mg via INTRAVENOUS
  Filled 2018-08-02: qty 100

## 2018-08-02 MED ORDER — BUPIVACAINE-EPINEPHRINE (PF) 0.5% -1:200000 IJ SOLN
INTRAMUSCULAR | Status: DC | PRN
  Administered 2018-08-02: 30 mL via PERINEURAL

## 2018-08-02 MED ORDER — FENTANYL CITRATE (PF) 100 MCG/2ML IJ SOLN: 25.0000 ug | INTRAMUSCULAR | Status: DC | PRN

## 2018-08-02 MED ORDER — ASPIRIN EC 325 MG PO TBEC
325.0000 mg | DELAYED_RELEASE_TABLET | Freq: Two times a day (BID) | ORAL | Status: DC
  Administered 2018-08-03: 325 mg via ORAL
  Filled 2018-08-02: qty 1

## 2018-08-02 MED ORDER — DEXAMETHASONE SODIUM PHOSPHATE 10 MG/ML IJ SOLN
INTRAMUSCULAR | Status: DC | PRN
  Administered 2018-08-02: 8 mg via INTRAVENOUS

## 2018-08-02 MED ORDER — TRANEXAMIC ACID-NACL 1000-0.7 MG/100ML-% IV SOLN
1000.0000 mg | INTRAVENOUS | Status: AC
  Administered 2018-08-02: 1000 mg via INTRAVENOUS
  Filled 2018-08-02: qty 100

## 2018-08-02 MED ORDER — ACETAMINOPHEN 500 MG PO TABS
1000.0000 mg | ORAL_TABLET | Freq: Four times a day (QID) | ORAL | Status: AC
  Administered 2018-08-02 – 2018-08-03 (×4): 1000 mg via ORAL
  Filled 2018-08-02 (×4): qty 2

## 2018-08-02 MED ORDER — MIDAZOLAM HCL 2 MG/2ML IJ SOLN
1.0000 mg | INTRAMUSCULAR | Status: DC
  Administered 2018-08-02: 2 mg via INTRAVENOUS
  Filled 2018-08-02: qty 2

## 2018-08-02 MED ORDER — FLEET ENEMA 7-19 GM/118ML RE ENEM: 1.0000 | ENEMA | Freq: Once | RECTAL | Status: DC | PRN

## 2018-08-02 MED ORDER — POLYETHYLENE GLYCOL 3350 17 G PO PACK: 17.0000 g | PACK | Freq: Every day | ORAL | Status: DC | PRN

## 2018-08-02 MED ORDER — ONDANSETRON HCL 4 MG PO TABS: 4.0000 mg | ORAL_TABLET | Freq: Four times a day (QID) | ORAL | Status: DC | PRN

## 2018-08-02 MED ORDER — ONDANSETRON HCL 4 MG/2ML IJ SOLN
INTRAMUSCULAR | Status: DC | PRN
  Administered 2018-08-02: 4 mg via INTRAVENOUS

## 2018-08-02 MED ORDER — SODIUM CHLORIDE 0.9 % IR SOLN
Status: DC | PRN
  Administered 2018-08-02: 1000 mL

## 2018-08-02 MED ORDER — METOCLOPRAMIDE HCL 5 MG PO TABS: 5.0000 mg | ORAL_TABLET | Freq: Three times a day (TID) | ORAL | Status: DC | PRN

## 2018-08-02 MED ORDER — OXYCODONE HCL 5 MG PO TABS
5.0000 mg | ORAL_TABLET | ORAL | Status: DC | PRN
  Administered 2018-08-02 – 2018-08-03 (×2): 10 mg via ORAL
  Filled 2018-08-02 (×2): qty 2

## 2018-08-02 MED ORDER — ONDANSETRON HCL 4 MG/2ML IJ SOLN: 4.0000 mg | Freq: Four times a day (QID) | INTRAMUSCULAR | Status: DC | PRN

## 2018-08-02 MED ORDER — MORPHINE SULFATE (PF) 4 MG/ML IV SOLN: 1.0000 mg | INTRAVENOUS | Status: DC | PRN

## 2018-08-02 MED ORDER — DIPHENHYDRAMINE HCL 12.5 MG/5ML PO ELIX: 12.5000 mg | ORAL_SOLUTION | ORAL | Status: DC | PRN

## 2018-08-02 MED ORDER — DEXAMETHASONE SODIUM PHOSPHATE 10 MG/ML IJ SOLN: 8.0000 mg | Freq: Once | INTRAMUSCULAR | Status: DC

## 2018-08-02 MED ORDER — DEXAMETHASONE SODIUM PHOSPHATE 10 MG/ML IJ SOLN
INTRAMUSCULAR | Status: AC
  Filled 2018-08-02: qty 3

## 2018-08-02 MED ORDER — PROPOFOL 10 MG/ML IV BOLUS
INTRAVENOUS | Status: AC
  Filled 2018-08-02: qty 80

## 2018-08-02 MED ORDER — SODIUM CHLORIDE (PF) 0.9 % IJ SOLN
INTRAMUSCULAR | Status: AC
  Filled 2018-08-02: qty 10

## 2018-08-02 MED ORDER — METHOCARBAMOL 500 MG IVPB - SIMPLE MED
INTRAVENOUS | Status: AC
  Filled 2018-08-02: qty 50

## 2018-08-02 SURGICAL SUPPLY — 52 items
BAG DECANTER FOR FLEXI CONT (MISCELLANEOUS) ×3 IMPLANT
BAG ZIPLOCK 12X15 (MISCELLANEOUS) IMPLANT
BANDAGE ACE 6X5 VEL STRL LF (GAUZE/BANDAGES/DRESSINGS) ×3 IMPLANT
BANDAGE ELASTIC 6 VELCRO ST LF (GAUZE/BANDAGES/DRESSINGS) ×6 IMPLANT
BLADE SAG 18X100X1.27 (BLADE) IMPLANT
BLADE SAW SGTL 11.0X1.19X90.0M (BLADE) IMPLANT
BLADE SURG SZ10 CARB STEEL (BLADE) ×6 IMPLANT
CLOSURE WOUND 1/2 X4 (GAUZE/BANDAGES/DRESSINGS) ×2
CLOTH BEACON ORANGE TIMEOUT ST (SAFETY) ×3 IMPLANT
COVER SURGICAL LIGHT HANDLE (MISCELLANEOUS) ×3 IMPLANT
COVER WAND RF STERILE (DRAPES) IMPLANT
CUFF TOURN SGL QUICK 34 (TOURNIQUET CUFF) ×2
CUFF TRNQT CYL 34X4X40X1 (TOURNIQUET CUFF) ×1 IMPLANT
DECANTER SPIKE VIAL GLASS SM (MISCELLANEOUS) ×6 IMPLANT
DRAPE U-SHAPE 47X51 STRL (DRAPES) ×3 IMPLANT
DRSG ADAPTIC 3X8 NADH LF (GAUZE/BANDAGES/DRESSINGS) ×3 IMPLANT
DRSG PAD ABDOMINAL 8X10 ST (GAUZE/BANDAGES/DRESSINGS) ×3 IMPLANT
DURAPREP 26ML APPLICATOR (WOUND CARE) ×3 IMPLANT
ELECT REM PT RETURN 15FT ADLT (MISCELLANEOUS) ×3 IMPLANT
EVACUATOR 1/8 PVC DRAIN (DRAIN) ×3 IMPLANT
GAUZE SPONGE 4X4 12PLY STRL (GAUZE/BANDAGES/DRESSINGS) ×3 IMPLANT
GLOVE BIO SURGEON STRL SZ8 (GLOVE) ×3 IMPLANT
GLOVE BIOGEL PI IND STRL 8 (GLOVE) ×1 IMPLANT
GLOVE BIOGEL PI INDICATOR 8 (GLOVE) ×2
GOWN STRL REUS W/TWL LRG LVL3 (GOWN DISPOSABLE) ×6 IMPLANT
HANDPIECE INTERPULSE COAX TIP (DISPOSABLE) ×2
HOLDER FOLEY CATH W/STRAP (MISCELLANEOUS) IMPLANT
IMMOBILIZER KNEE 20 (SOFTGOODS) ×3
IMMOBILIZER KNEE 20 THIGH 36 (SOFTGOODS) ×1 IMPLANT
INSERT TIBIAL KNEE SZ 5 12MM (Insert) ×1 IMPLANT
MANIFOLD NEPTUNE II (INSTRUMENTS) ×3 IMPLANT
NS IRRIG 1000ML POUR BTL (IV SOLUTION) ×3 IMPLANT
PACK TOTAL KNEE CUSTOM (KITS) ×3 IMPLANT
PAD ABD 8X10 STRL (GAUZE/BANDAGES/DRESSINGS) ×6 IMPLANT
PADDING CAST COTTON 6X4 STRL (CAST SUPPLIES) ×6 IMPLANT
PROTECTOR NERVE ULNAR (MISCELLANEOUS) ×3 IMPLANT
SET HNDPC FAN SPRY TIP SCT (DISPOSABLE) ×1 IMPLANT
STRIP CLOSURE SKIN 1/2X4 (GAUZE/BANDAGES/DRESSINGS) ×4 IMPLANT
SUT MNCRL AB 4-0 PS2 18 (SUTURE) ×3 IMPLANT
SUT STRATAFIX 0 PDS 27 VIOLET (SUTURE) ×3
SUT VIC AB 2-0 CT1 27 (SUTURE) ×6
SUT VIC AB 2-0 CT1 TAPERPNT 27 (SUTURE) ×3 IMPLANT
SUTURE STRATFX 0 PDS 27 VIOLET (SUTURE) ×1 IMPLANT
SWAB COLLECTION DEVICE MRSA (MISCELLANEOUS) IMPLANT
SWAB CULTURE ESWAB REG 1ML (MISCELLANEOUS) IMPLANT
SYR 50ML LL SCALE MARK (SYRINGE) ×6 IMPLANT
TIBIAL INSERT KNEE SZ 5 12MM (Insert) ×3 IMPLANT
TOWER CARTRIDGE SMART MIX (DISPOSABLE) IMPLANT
TRAY FOLEY CATH 14FR (SET/KITS/TRAYS/PACK) ×3 IMPLANT
TUBE KAMVAC SUCTION (TUBING) IMPLANT
WATER STERILE IRR 1000ML POUR (IV SOLUTION) ×3 IMPLANT
WRAP KNEE MAXI GEL POST OP (GAUZE/BANDAGES/DRESSINGS) ×3 IMPLANT

## 2018-08-02 NOTE — Brief Op Note (Signed)
08/02/2018  10:15 AM  PATIENT:  Marissa [name]  58 y.o. female  PRE-OPERATIVE DIAGNOSIS:  Unstable right total knee arthroplasty  POST-OPERATIVE DIAGNOSIS:  Unstable right total knee arthroplasty  PROCEDURE:  Procedure(s) with comments: Right total knee arthroplasty poly revision (Right) - 56min  SURGEON:  Surgeon(s) and Role:    Gaynelle Arabian, MD - Primary  PHYSICIAN ASSISTANT:   ASSISTANTS: Griffith Citron, PA-C   ANESTHESIA:   Adductor canal block and spinal  EBL:  25 ml  BLOOD ADMINISTERED:none  DRAINS: (Medium) Hemovact drain(s) in the right knee with  Suction Open   LOCAL MEDICATIONS USED:  OTHER Exparel  COUNTS:  YES  TOURNIQUET:   Total Tourniquet Time Documented: Thigh (Right) - 22 minutes Total: Thigh (Right) - 22 minutes   DICTATION: .Other Dictation: Dictation Number [id number]  PLAN OF CARE: Admit for overnight observation  PATIENT DISPOSITION:  PACU - hemodynamically stable.

## 2018-08-02 NOTE — Progress Notes (Signed)
Pt has declined use of CPAP QHS.  RT to monitor and assess as needed.  

## 2018-08-02 NOTE — Anesthesia Procedure Notes (Signed)
Anesthesia Regional Block: Adductor canal block   Pre-Anesthetic Checklist: ,, timeout performed, Correct Patient, Correct Site, Correct Laterality, Correct Procedure, Correct Position, site marked, Risks and benefits discussed,  Surgical consent,  Pre-op evaluation,  At surgeon's request and post-op pain management  Laterality: Right  Prep: chloraprep       Needles:  Injection technique: Single-shot  Needle Type: Echogenic Needle     Needle Length: 10cm  Needle Gauge: 21     Additional Needles:   Narrative:  Start time: 08/02/2018 9:08 AM End time: 08/02/2018 9:12 AM Injection made incrementally with aspirations every 5 mL.  Performed by: Personally  Anesthesiologist: Audry Pili, MD  Additional Notes: No pain on injection. No increased resistance to injection. Injection made in 5cc increments. Good needle visualization. Patient tolerated the procedure well.

## 2018-08-02 NOTE — Plan of Care (Signed)
  Problem: Education: Goal: Knowledge of the prescribed therapeutic regimen will improve Outcome: Progressing Goal: Individualized Educational Video(s) Outcome: Progressing   

## 2018-08-02 NOTE — Discharge Instructions (Addendum)
Dr. Gaynelle Arabian Total Joint Specialist [facility] [address] [place], [place] [address] [phone]  TOTAL KNEE POLYETHYLENE REVISION POSTOPERATIVE DIRECTIONS  Knee Rehabilitation, Guidelines Following Surgery  Results after knee surgery are often greatly improved when you follow the exercise, range of motion and muscle strengthening exercises prescribed by your doctor. Safety measures are also important to protect the knee from further injury. Any time any of these exercises cause you to have increased pain or swelling in your knee joint, decrease the amount until you are comfortable again and slowly increase them. If you have problems or questions, call your caregiver or physical therapist for advice.   HOME CARE INSTRUCTIONS   Remove items at home which could result in a fall. This includes throw rugs or furniture in walking pathways.   ICE to the affected knee every three hours for 30 minutes at a time and then as needed for pain and swelling.  Continue to use ice on the knee for pain and swelling from surgery. You may notice swelling that will progress down to the foot and ankle.  This is normal after surgery.  Elevate the leg when you are not up walking on it.    Continue to use the breathing machine which will help keep your temperature down.  It is common for your temperature to cycle up and down following surgery, especially at night when you are not up moving around and exerting yourself.  The breathing machine keeps your lungs expanded and your temperature down.  Do not place pillow under knee, focus on keeping the knee straight while resting  DIET You may resume your previous home diet once your are discharged from the hospital.  DRESSING / WOUND CARE / SHOWERING You may shower 3 days after surgery, but keep the wounds dry during showering.  You may use an occlusive plastic wrap (Press'n Seal for example), NO SOAKING/SUBMERGING IN THE BATHTUB.  If the  bandage gets wet, change with a clean dry gauze.  If the incision gets wet, pat the wound dry with a clean towel. You may start showering once you are discharged home but do not submerge the incision under water. Just pat the incision dry and apply a dry gauze dressing on daily. Change the surgical dressing daily and reapply a dry dressing each time.  ACTIVITY Walk with your walker as instructed. Use walker as long as suggested by your caregivers. Avoid periods of inactivity such as sitting longer than an hour when not asleep. This helps prevent blood clots.  You may resume a sexual relationship in one month or when given the OK by your doctor.  You may return to work once you are cleared by your doctor.  Do not drive a car for 6 weeks or until released by you surgeon.  Do not drive while taking narcotics.  WEIGHT BEARING Weight bearing as tolerated with assist device (walker, cane, etc) as directed, use it as long as suggested by your surgeon or therapist, typically at least 4-6 weeks.  POSTOPERATIVE CONSTIPATION PROTOCOL Constipation - defined medically as fewer than three stools per week and severe constipation as less than one stool per week.  One of the most common issues patients have following surgery is constipation.  Even if you have a regular bowel pattern at home, your normal regimen is likely to be disrupted due to multiple reasons following surgery.  Combination of anesthesia, postoperative narcotics, change in appetite and fluid intake all can affect your  bowels.  In order to avoid complications following surgery, here are some recommendations in order to help you during your recovery period. ° °Colace (docusate) - Pick up an over-the-counter form of Colace or another stool softener and take twice a day as long as you are requiring postoperative pain medications.  Take with a full glass of water daily.  If you experience loose stools or diarrhea, hold the colace until you stool forms  back up.  If your symptoms do not get better within 1 week or if they get worse, check with your doctor. ° °Dulcolax (bisacodyl) - Pick up over-the-counter and take as directed by the product packaging as needed to assist with the movement of your bowels.  Take with a full glass of water.  Use this product as needed if not relieved by Colace only.  ° °MiraLax (polyethylene glycol) - Pick up over-the-counter to have on hand.  MiraLax is a solution that will increase the amount of water in your bowels to assist with bowel movements.  Take as directed and can mix with a glass of water, juice, soda, coffee, or tea.  Take if you go more than two days without a movement. °Do not use MiraLax more than once per day. Call your doctor if you are still constipated or irregular after using this medication for 7 days in a row. ° °If you continue to have problems with postoperative constipation, please contact the office for further assistance and recommendations.  If you experience "the worst abdominal pain ever" or develop nausea or vomiting, please contact the office immediatly for further recommendations for treatment. ° °ITCHING ° If you experience itching with your medications, try taking only a single pain pill, or even half a pain pill at a time.  You can also use Benadryl over the counter for itching or also to help with sleep.  ° °TED HOSE STOCKINGS °Wear the elastic stockings on both legs for three weeks following surgery during the day but you may remove then at night for sleeping. ° °MEDICATIONS °See your medication summary on the “After Visit Summary” that the nursing staff will review with you prior to discharge.  You may have some home medications which will be placed on hold until you complete the course of blood thinner medication.  It is important for you to complete the blood thinner medication as prescribed by your surgeon.  Continue your approved medications as instructed at time of  discharge. ° °PRECAUTIONS °If you experience chest pain or shortness of breath - call 911 immediately for transfer to the hospital emergency department.  °If you develop a fever greater that 101 F, purulent drainage from wound, increased redness or drainage from wound, foul odor from the wound/dressing, or calf pain - CONTACT YOUR SURGEON.   °                                                °FOLLOW-UP APPOINTMENTS °Make sure you keep all of your appointments after your operation with your surgeon and caregivers. You should call the office at the above phone number and make an appointment for approximately two weeks after the date of your surgery or on the date instructed by your surgeon outlined in the "After Visit Summary". ° ° °RANGE OF MOTION AND STRENGTHENING EXERCISES  °Rehabilitation of the knee is important following a knee   injury or an operation. After just a few days of immobilization, the muscles of the thigh which control the knee become weakened and shrink (atrophy). Knee exercises are designed to build up the tone and strength of the thigh muscles and to improve knee motion. Often times heat used for twenty to thirty minutes before working out will loosen up your tissues and help with improving the range of motion but do not use heat for the first two weeks following surgery. These exercises can be done on a training (exercise) mat, on the floor, on a table or on a bed. Use what ever works the best and is most comfortable for you Knee exercises include:   Leg Lifts - While your knee is still immobilized in a splint or cast, you can do straight leg raises. Lift the leg to 60 degrees, hold for 3 sec, and slowly lower the leg. Repeat 10-20 times 2-3 times daily. Perform this exercise against resistance later as your knee gets better.   Quad and Hamstring Sets - Tighten up the muscle on the front of the thigh (Quad) and hold for 5-10 sec. Repeat this 10-20 times hourly. Hamstring sets are done by pushing  the foot backward against an object and holding for 5-10 sec. Repeat as with quad sets.   Leg Slides: Lying on your back, slowly slide your foot toward your buttocks, bending your knee up off the floor (only go as far as is comfortable). Then slowly slide your foot back down until your leg is flat on the floor again.  Angel Wings: Lying on your back spread your legs to the side as far apart as you can without causing discomfort.  A rehabilitation program following serious knee injuries can speed recovery and prevent re-injury in the future due to weakened muscles. Contact your doctor or a physical therapist for more information on knee rehabilitation.   IF YOU ARE TRANSFERRED TO A SKILLED REHAB FACILITY If the patient is transferred to a skilled rehab facility following release from the hospital, a list of the current medications will be sent to the facility for the patient to continue.  When discharged from the skilled rehab facility, please have the facility set up the patient's [place] prior to being released. Also, the skilled facility will be responsible for providing the patient with their medications at time of release from the facility to include their pain medication, the muscle relaxants, and their blood thinner medication. If the patient is still at the rehab facility at time of the two week follow up appointment, the skilled rehab facility will also need to assist the patient in arranging follow up appointment in our office and any transportation needs.  MAKE SURE YOU:   Understand these instructions.   Get help right away if you are not doing well or get worse.    Pick up stool softner and laxative for home use following surgery while on pain medications. Do not submerge incision under water. Please use good hand washing techniques while changing dressing each day. May shower starting three days after surgery. Please use a clean towel to pat the incision dry  following showers. Continue to use ice for pain and swelling after surgery. Do not use any lotions or creams on the incision until instructed by your surgeon.

## 2018-08-02 NOTE — Anesthesia Procedure Notes (Signed)
Spinal  Patient location during procedure: OR Start time: 08/02/2018 9:23 AM End time: 08/02/2018 9:26 AM Staffing Anesthesiologist: Audry Pili, MD Performed: anesthesiologist  Preanesthetic Checklist Completed: patient identified, surgical consent, pre-op evaluation, timeout performed, IV checked, risks and benefits discussed and monitors and equipment checked Spinal Block Patient position: sitting Prep: DuraPrep Patient monitoring: heart rate, cardiac monitor, continuous pulse ox and blood pressure Approach: midline Location: L3-4 Injection technique: single-shot Needle Needle type: Pencan  Needle gauge: 24 G Additional Notes Consent was obtained prior to the procedure with all questions answered and concerns addressed. Risks including, but not limited to, bleeding, infection, nerve damage, paralysis, failed block, inadequate analgesia, allergic reaction, high spinal, itching, and headache were discussed and the patient wished to proceed. Functioning IV was confirmed and monitors were applied. Sterile prep and drape, including hand hygiene, mask, and sterile gloves were used. The patient was positioned and the spine was prepped. The skin was anesthetized with lidocaine. Free flow of clear CSF was obtained prior to injecting local anesthetic into the CSF. The spinal needle aspirated freely following injection. The needle was carefully withdrawn. The patient tolerated the procedure well.   Marissa Don, MD

## 2018-08-02 NOTE — Anesthesia Postprocedure Evaluation (Signed)
Anesthesia Post Note  Patient: Marissa [name]  Procedure(s) Performed: Right total knee arthroplasty poly revision (Right Knee)     Patient location during evaluation: PACU Anesthesia Type: Spinal Level of consciousness: awake and alert Pain management: pain level controlled Vital Signs Assessment: post-procedure vital signs reviewed and stable Respiratory status: spontaneous breathing and respiratory function stable Cardiovascular status: blood pressure returned to baseline and stable Postop Assessment: spinal receding and no apparent nausea or vomiting Anesthetic complications: no    Last Vitals:  Vitals:   08/02/18 1200 08/02/18 1214  BP: 125/78 126/75  Pulse: (!) 59 67  Resp: 14   Temp: 36.4 C 36.7 C  SpO2: 99% 96%    Last Pain:  Vitals:   08/02/18 1214  TempSrc: Oral  PainSc: 0-No pain                 Audry Pili

## 2018-08-02 NOTE — Transfer of Care (Signed)
Immediate Anesthesia Transfer of Care Note  Patient: Marissa [name]  Procedure(s) Performed: Procedure(s) with comments: Right total knee arthroplasty poly revision (Right) - 33min  Patient Location: PACU  Anesthesia Type:Spinal  Level of Consciousness:  sedated, patient cooperative and responds to stimulation  Airway & Oxygen Therapy:Patient Spontanous Breathing and Patient connected to face mask oxgen  Post-op Assessment:  Report given to PACU RN and Post -op Vital signs reviewed and stable  Post vital signs:  Reviewed and stable  Last Vitals:  Vitals:   08/02/18 0735  BP: 132/80  Pulse: 71  Temp: 37.4 C  SpO2: 90%    Complications: No apparent anesthesia complications

## 2018-08-02 NOTE — Op Note (Signed)
NAME: Marissa [name], TRAGER MEDICAL RECORD DJ:[id number] ACCOUNT [id number] DATE OF BIRTH:March 20, 1961 FACILITY: [facility] LOCATION: [facility] PHYSICIAN:Carmell Elgin Zella Ball, MD  OPERATIVE REPORT  DATE OF PROCEDURE:  08/02/2018  PREOPERATIVE DIAGNOSIS:  Unstable right total knee arthroplasty.  POSTOPERATIVE DIAGNOSIS:  Unstable right total knee arthroplasty.  PROCEDURE:  Right knee tibial polyethylene revision.  SURGEON:  Gaynelle Arabian, MD  ASSISTANT:  Griffith Citron, PA-C  ANESTHESIA:  Adductor canal block and spinal.  ESTIMATED BLOOD LOSS:  25 mL.  DRAINS:  Hemovac x1.  TOURNIQUET TIME:  22 minutes at 300 mmHg.  COMPLICATIONS:  None.  CONDITION:  Stable to recovery.  BRIEF CLINICAL NOTE:  The patient is a 58 year old female who had a right total knee arthroplasty done a few years ago and has had persistent pain and instability in the knee.  Exam showed an unstable knee.  There were no signs of any periprosthetic  loosening.  She has failed nonoperative management and presents now for polyethylene versus total knee revision.  PROCEDURE IN DETAIL:  After successful administration of adductor canal block and spinal anesthetic, a tourniquet was placed high on her right thigh and right lower extremity, prepped and draped in the usual sterile fashion.  Extremity was wrapped in an  Esmarch, tourniquet inflated to 300 mmHg.  A midline incision was made with a 10 blade through subcutaneous tissue to the level of the extensor mechanism.  A fresh blade was used to make a medial parapatellar arthrotomy.  We did not encounter fluid in  the knee.  Scar tissue was excised with electrocautery.   Please note that prior to making the skin incision, we did an exam under anesthesia, and she had significant varus/valgus and anterior/posterior laxity.  The soft tissue over the proximal medial tibia subperiosteally elevated to the joint line with a knife and into the semimembranosus bursa with a Cobb elevator.   Soft tissue laterally was also elevated with attention being paid to avoid the patellar  tendon on the tibial tubercle.  I was able to evert the patella and flex the knee 90 degrees.  There was enough instability where I was able to sublux the tibia forward and remove the tibial polyethylene.  It was a 6 mm thickness rotating platform,  polyethylene posterior stabilized for an Attune tibia and femur.  It was only 6 mm thick.  I felt that we needed to go up at least 2 sizes, so we put in a trial for a 10 mm insert.  With the 10, it reduced, but there was a tiny bit of AP laxity and a  tiny bit of varus/valgus laxity, so I went to a 12 mm insert.  With the 12, full extension was achieved with excellent varus/valgus and anterior/posterior balance throughout full range of motion.  It was excellent stability throughout.  I removed the  trial insert.  I then tested the interfaces between the components, both femur and tibia and bone.  There was no evidence of any loosening, and they were stable throughout.  We then placed the 12 mm size 5 posterior stabilized rotating platform Attune  tibial insert in place.  The knee was reduced with outstanding stability.  There was full extension with excellent varus/valgus balance and anterior/posterior balance throughout full range of motion.  The wound was then copiously irrigated with saline  solution.  Twenty mL of Exparel mixed with 60 mL of saline were then injected into the posterior capsule of the extensor mechanism, the periosteum of the femur, and subcutaneous  tissues.  The arthrotomy was closed over a Hemovac drain with a running 0  Stratafix suture.  Flexion against gravity was 125 degrees, and the patella tracks normally.  Tourniquet was released.  Total time of 22 minutes.  Minor bleeding was stopped with cautery.  The second limb of the Hemovac drain was placed subcutaneously.   Subcu layer was then closed with interrupted 2-0 Vicryl and subcuticular running 4-0  Monocryl.  The drain was hooked to suction.  The incision was cleaned and dried, and Steri-Strips and a bulky sterile dressing were applied.  A knee immobilizer was not  utilized.  She was subsequently awakened and transported to recovery in stable condition.  Note that a surgical assistant was a medical necessity for this procedure.  Surgical assistance necessary for retraction of vital ligaments and neurovascular structures and for proper positioning of the limb for safe removal of the old prosthesis and for  safe and accurate placement of the new prosthesis.  LN/NUANCE  D:08/02/2018 T:08/02/2018 JOB:[id number]

## 2018-08-02 NOTE — Evaluation (Signed)
Physical Therapy Evaluation Patient Details Name: Marissa [name] MRN: [id number] DOB: 1961-03-14 Today's Date: 08/02/2018   History of Present Illness  Pt s/p poly revision of R TKR   Clinical Impression  Pt s/p R TKR revision and presents with decreased R LE strength/ROM, post op pain and obesity limiting functional mobility.  Pt should progress to dc home with family assist and plans OP PT starting Monday 08/07/18.    Follow Up Recommendations Follow surgeon's recommendation for DC plan and follow-up therapies    Equipment Recommendations  None recommended by PT    Recommendations for Other Services       Precautions / Restrictions Precautions Precautions: Fall;Knee Restrictions Weight Bearing Restrictions: No Other Position/Activity Restrictions: WBAT      Mobility  Bed Mobility Overal bed mobility: Needs Assistance Bed Mobility: Supine to Sit     Supine to sit: Min assist     General bed mobility comments: cues for sequence and use of L LE to self assist  Transfers Overall transfer level: Needs assistance Equipment used: Rolling walker (2 wheeled) Transfers: Sit to/from Stand Sit to Stand: Min assist;Min guard         General transfer comment: cues for LE management and use of UEs to self assist  Ambulation/Gait Ambulation/Gait assistance: Min assist;Min guard Gait Distance (Feet): 75 Feet Assistive device: Rolling walker (2 wheeled) Gait Pattern/deviations: Step-to pattern;Decreased step length - right;Decreased step length - left;Shuffle;Trunk flexed Gait velocity: decr   General Gait Details: cues for posture, position from RW and sequence  Stairs            Wheelchair Mobility    Modified Rankin (Stroke Patients Only)       Balance Overall balance assessment: Mild deficits observed, not formally tested                                           Pertinent Vitals/Pain Pain Assessment: 0-10 Pain Score: 5  Pain  Location: R knee Pain Descriptors / Indicators: Aching;Sore Pain Intervention(s): Limited activity within patient's tolerance;Premedicated before session;Monitored during session;Ice applied    Home Living Family/patient expects to be discharged to:: Private residence Living Arrangements: Spouse/significant other Available Help at Discharge: Family Type of Home: House Home Access: Stairs to enter Entrance Stairs-Rails: Right Entrance Stairs-Number of Steps: [place]: Two level;Able to live on main level with bedroom/bathroom Home Equipment: Gilford Rile - 2 wheels;Cane - single point;Crutches;Bedside commode;Shower seat      Prior Function Level of Independence: Independent               Hand Dominance        Extremity/Trunk Assessment   Upper Extremity Assessment Upper Extremity Assessment: Overall WFL for tasks assessed    Lower Extremity Assessment Lower Extremity Assessment: RLE deficits/detail    Cervical / Trunk Assessment Cervical / Trunk Assessment: Normal  Communication   Communication: No difficulties  Cognition Arousal/Alertness: Awake/alert Behavior During Therapy: WFL for tasks assessed/performed Overall Cognitive Status: Within Functional Limits for tasks assessed                                        General Comments      Exercises Total Joint Exercises Ankle Circles/Pumps: AROM;Both;15 reps;Supine   Assessment/Plan    PT Assessment Patient needs  continued PT services  PT Problem List Decreased strength;Decreased range of motion;Decreased activity tolerance;Decreased mobility;Pain;Decreased knowledge of use of DME;Obesity       PT Treatment Interventions DME instruction;Gait training;Stair training;Functional mobility training;Therapeutic activities;Therapeutic exercise;Patient/family education    PT Goals (Current goals can be found in the Care Plan section)  Acute Rehab PT Goals Patient Stated Goal: Regain IND PT  Goal Formulation: With patient Time For Goal Achievement: 08/09/18 Potential to Achieve Goals: Good    Frequency 7X/week   Barriers to discharge        Co-evaluation               AM-PAC PT "6 Clicks" Mobility  Outcome Measure Help needed turning from your back to your side while in a flat bed without using bedrails?: A Little Help needed moving from lying on your back to sitting on the side of a flat bed without using bedrails?: A Little Help needed moving to and from a bed to a chair (including a wheelchair)?: A Little Help needed standing up from a chair using your arms (e.g., wheelchair or bedside chair)?: A Little Help needed to walk in hospital room?: A Little Help needed climbing 3-5 steps with a railing? : A Lot 6 Click Score: 17    End of Session Equipment Utilized During Treatment: Gait belt Activity Tolerance: Patient tolerated treatment well;Patient limited by pain Patient left: in chair;with call bell/phone within reach Nurse Communication: Mobility status PT Visit Diagnosis: Difficulty in walking, not elsewhere classified (R26.2)    Time: 8250-5397 PT Time Calculation (min) (ACUTE ONLY): 23 min   Charges:   PT Evaluation $PT Eval Low Complexity: 1 Low PT Treatments $Gait Training: 8-22 mins        [place] Pager [phone] Office [phone]   Mikkel Charrette 08/02/2018, 4:12 PM

## 2018-08-02 NOTE — Interval H&P Note (Signed)
History and Physical Interval Note:  08/02/2018 8:08 AM  Marissa [name]  has presented today for surgery, with the diagnosis of painful right total knee arthroplasty  The various methods of treatment have been discussed with the patient and family. After consideration of risks, benefits and other options for treatment, the patient has consented to  Procedure(s) with comments: Right total knee arthroplasty poly revision (Right) - 30min as a surgical intervention .  The patient's history has been reviewed, patient examined, no change in status, stable for surgery.  I have reviewed the patient's chart and labs.  Questions were answered to the patient's satisfaction.     Pilar Plate Sevon Rotert

## 2018-08-03 ENCOUNTER — Encounter (HOSPITAL_COMMUNITY): Payer: Self-pay | Admitting: Orthopedic Surgery

## 2018-08-03 LAB — CBC
HCT: 38.5 % (ref 36.0–46.0)
Hemoglobin: 12.7 g/dL (ref 12.0–15.0)
MCH: 32.7 pg (ref 26.0–34.0)
MCHC: 33 g/dL (ref 30.0–36.0)
MCV: 99.2 fL (ref 80.0–100.0)
Platelets: 302 10*3/uL (ref 150–400)
RBC: 3.88 MIL/uL (ref 3.87–5.11)
RDW: 12 % (ref 11.5–15.5)
WBC: 16.1 10*3/uL — ABNORMAL HIGH (ref 4.0–10.5)
nRBC: 0 % (ref 0.0–0.2)

## 2018-08-03 LAB — BASIC METABOLIC PANEL
Anion gap: 8 (ref 5–15)
BUN: 13 mg/dL (ref 6–20)
CO2: 24 mmol/L (ref 22–32)
Calcium: 9.1 mg/dL (ref 8.9–10.3)
Chloride: 106 mmol/L (ref 98–111)
Creatinine, Ser: 0.88 mg/dL (ref 0.44–1.00)
GFR calc Af Amer: >60 mL/min (ref >60)
GFR calc non Af Amer: >60 mL/min (ref >60)
Glucose, Bld: 144 mg/dL — ABNORMAL HIGH (ref 70–99)
Potassium: 4.8 mmol/L (ref 3.5–5.1)
Sodium: 138 mmol/L (ref 135–145)

## 2018-08-03 MED ORDER — METHOCARBAMOL 500 MG PO TABS: 500.0000 mg | ORAL_TABLET | Freq: Four times a day (QID) | ORAL | 0 refills | Status: DC | PRN

## 2018-08-03 MED ORDER — TRAMADOL HCL 50 MG PO TABS: 50.0000 mg | ORAL_TABLET | Freq: Four times a day (QID) | ORAL | 0 refills | Status: DC | PRN

## 2018-08-03 MED ORDER — OXYCODONE HCL 5 MG PO TABS: 5.0000 mg | ORAL_TABLET | Freq: Four times a day (QID) | ORAL | 0 refills | Status: DC | PRN

## 2018-08-03 MED ORDER — ASPIRIN 325 MG PO TBEC: 325.0000 mg | DELAYED_RELEASE_TABLET | Freq: Two times a day (BID) | ORAL | 0 refills | Status: AC

## 2018-08-03 NOTE — Progress Notes (Signed)
Physical Therapy Treatment Patient Details Name: Marissa [name] MRN: [id number] DOB: 03-02-1961 Today's Date: 08/03/2018    History of Present Illness Pt s/p poly revision of R TKR     PT Comments    Ready for Dc.  Follow Up Recommendations  Follow surgeon's recommendation for DC plan and follow-up therapies;Outpatient PT     Equipment Recommendations  None recommended by PT    Recommendations for Other Services       Precautions / Restrictions Precautions Precautions: Fall;Knee Restrictions Other Position/Activity Restrictions: WBAT    Mobility  Bed Mobility Overal bed mobility: Independent             General bed mobility comments: OOB  Transfers Overall transfer level: Needs assistance Equipment used: Rolling walker (2 wheeled) Transfers: Sit to/from Stand Sit to Stand: Supervision            Ambulation/Gait Ambulation/Gait assistance: Supervision Gait Distance (Feet): 300 Feet Assistive device: Rolling walker (2 wheeled) Gait Pattern/deviations: Step-through pattern;Antalgic Gait velocity: decr   General Gait Details: cues for posture, position from RW and sequence   Stairs Stairs: Yes Stairs assistance: Supervision Stair Management: One rail Right;Forwards Number of Stairs: 4 General stair comments: patient tolerated stairs    Wheelchair Mobility    Modified Rankin (Stroke Patients Only)       Balance                                            Cognition Arousal/Alertness: Awake/alert                                            Exercises Total Joint Exercises Ankle Circles/Pumps: AROM;Both;15 reps;Supine Quad Sets: AROM;Both;10 reps Short Arc Quad: AROM;AAROM;Right;10 reps Heel Slides: AAROM;Right;10 reps Hip ABduction/ADduction: AAROM;Right;10 reps Straight Leg Raises: AAROM;Right;10 reps Long Arc Quad: AROM;Right;10 reps Knee Flexion: AROM;Right;10 reps Goniometric ROM:  10-50-dressing mpeding    General Comments        Pertinent Vitals/Pain Pain Score: 3  Pain Location: medial right  Pain Descriptors / Indicators: Tightness Pain Intervention(s): Premedicated before session    Home Living                      Prior Function            PT Goals (current goals can now be found in the care plan section) Progress towards PT goals: Progressing toward goals    Frequency    7X/week      PT Plan Current plan remains appropriate    Co-evaluation              AM-PAC PT "6 Clicks" Mobility   Outcome Measure  Help needed turning from your back to your side while in a flat bed without using bedrails?: None Help needed moving from lying on your back to sitting on the side of a flat bed without using bedrails?: None Help needed moving to and from a bed to a chair (including a wheelchair)?: A Little Help needed standing up from a chair using your arms (e.g., wheelchair or bedside chair)?: A Little Help needed to walk in hospital room?: A Little Help needed climbing 3-5 steps with a railing? : A Little 6 Click Score: 20  End of Session   Activity Tolerance: Patient tolerated treatment well;Patient limited by pain Patient left: in chair;with call bell/phone within reach Nurse Communication: Mobility status PT Visit Diagnosis: Difficulty in walking, not elsewhere classified (R26.2)     Time: 9476-5465 PT Time Calculation (min) (ACUTE ONLY): 11 min  Charges:  $Gait Training: 8-22 mins $Therapeutic Exercise: 8-22 mins                     Tresa Endo PT Acute Rehabilitation Services Pager [phone] Office [phone]      Claretha Cooper 08/03/2018, 2:12 PM

## 2018-08-03 NOTE — Progress Notes (Signed)
Physical Therapy Treatment Patient Details Name: Marissa [name] MRN: [id number] DOB: June 25, 1960 Today's Date: 08/03/2018    History of Present Illness Pt s/p poly revision of R TKR     PT Comments    The patient reports medial thigh tightness. Progressing well. Plans DC after practice steps.  Follow Up Recommendations  Follow surgeon's recommendation for DC plan and follow-up therapies;Outpatient PT     Equipment Recommendations  None recommended by PT    Recommendations for Other Services       Precautions / Restrictions Precautions Precautions: Fall;Knee    Mobility  Bed Mobility Overal bed mobility: Independent                Transfers   Equipment used: Rolling walker (2 wheeled) Transfers: Sit to/from Stand Sit to Stand: Supervision            Ambulation/Gait Ambulation/Gait assistance: Min guard Gait Distance (Feet): 440 Feet Assistive device: Rolling walker (2 wheeled) Gait Pattern/deviations: Step-through pattern;Antalgic Gait velocity: decr       Stairs             Wheelchair Mobility    Modified Rankin (Stroke Patients Only)       Balance                                            Cognition Arousal/Alertness: Awake/alert                                            Exercises Total Joint Exercises Ankle Circles/Pumps: AROM;Both;15 reps;Supine Quad Sets: AROM;Both;10 reps Short Arc Quad: AROM;AAROM;Right;10 reps Heel Slides: AAROM;Right;10 reps Hip ABduction/ADduction: AAROM;Right;10 reps Straight Leg Raises: AAROM;Right;10 reps Long Arc Quad: AROM;Right;10 reps Knee Flexion: AROM;Right;10 reps Goniometric ROM: 10-50-dressing mpeding    General Comments        Pertinent Vitals/Pain Pain Score: 5  Pain Location: medial right  Pain Descriptors / Indicators: Tightness Pain Intervention(s): Monitored during session;Ice applied    Home Living                       Prior Function            PT Goals (current goals can now be found in the care plan section) Progress towards PT goals: Progressing toward goals    Frequency    7X/week      PT Plan Current plan remains appropriate    Co-evaluation              AM-PAC PT "6 Clicks" Mobility   Outcome Measure  Help needed turning from your back to your side while in a flat bed without using bedrails?: None Help needed moving from lying on your back to sitting on the side of a flat bed without using bedrails?: None Help needed moving to and from a bed to a chair (including a wheelchair)?: A Little Help needed standing up from a chair using your arms (e.g., wheelchair or bedside chair)?: A Little Help needed to walk in hospital room?: A Little Help needed climbing 3-5 steps with a railing? : A Little 6 Click Score: 20    End of Session   Activity Tolerance: Patient tolerated treatment well;Patient limited by pain Patient left: in chair;with call  bell/phone within reach Nurse Communication: Mobility status PT Visit Diagnosis: Difficulty in walking, not elsewhere classified (R26.2)     Time: 7981-0254 PT Time Calculation (min) (ACUTE ONLY): 26 min  Charges:  $Gait Training: 8-22 mins $Therapeutic Exercise: 8-22 mins                     Tresa Endo PT Acute Rehabilitation Services Pager [phone] Office [phone]    Claretha Cooper 08/03/2018, 12:27 PM

## 2018-08-03 NOTE — Progress Notes (Signed)
Subjective: 1 Day Post-Op Procedure(s) (LRB): Right total knee arthroplasty poly revision (Right) Patient reports pain as mild.   Patient seen in rounds by Dr. Wynelle Link. Patient is well, and has had no acute complaints or problems other than pain in the right knee. No acute events overnight. Foley catheter removed this AM, positive flatus. Denies CP, SHOB, calf pain.  We will start therapy today.   Objective: Vital signs in last 24 hours: Temp:  [97.6 F (36.4 C)-99.3 F (37.4 C)] 98.3 F (36.8 C) (02/13 0601) Pulse Rate:  [58-78] 63 (02/13 0601) Resp:  [14-20] 16 (02/13 0601) BP: (125-170)/(70-104) 170/95 (02/13 0601) SpO2:  [95 %-100 %] 100 % (02/13 0601) Weight:  [121.1 kg] 121.1 kg (02/12 0747)  Intake/Output from previous day:  Intake/Output Summary (Last 24 hours) at 08/03/2018 0710 Last data filed at 08/03/2018 0603 Gross per 24 hour  Intake 4278.2 ml  Output 4240 ml  Net 38.2 ml     Intake/Output this shift: No intake/output data recorded.  Labs: Recent Labs    08/03/18 0522  HGB 12.7   Recent Labs    08/03/18 0522  WBC 16.1*  RBC 3.88  HCT 38.5  PLT 302   Recent Labs    08/03/18 0522  NA 138  K 4.8  CL 106  CO2 24  BUN 13  CREATININE 0.88  GLUCOSE 144*  CALCIUM 9.1   No results for input(s): LABPT, INR in the last 72 hours.  Exam: General - Patient is Alert and Oriented Extremity - Neurologically intact Sensation intact distally Intact pulses distally Dorsiflexion/Plantar flexion intact Dressing - dressing C/D/I Motor Function - intact, moving foot and toes well on exam.   Past Medical History:  Diagnosis Date  . Abnormal uterine bleeding    due to fibroids  . Anxiety   . Arthritis    knee, scheduled for knee replacement 04/2016  . Asthma    exercise induced-no inhalers  . Complication of anesthesia   . Dysmenorrhea    2000  from fibroids  . Family history of adverse reaction to anesthesia 02/26/2016   father had shoulder  replacement-admitted back to hospital for related complications  . Fibroid   . Genital warts 1994   Tx  x2  . GERD (gastroesophageal reflux disease)   . Head injury with loss of consciousness ([place]) 1986  . Hyperlipidemia   . PONV (postoperative nausea and vomiting)   . Primary localized osteoarthrosis of the knee, right   . Sleep apnea    has a cpap-cannot use-makes her sick  . Unilateral post-traumatic osteoarthritis, right knee 04/21/2016    Assessment/Plan: 1 Day Post-Op Procedure(s) (LRB): Right total knee arthroplasty poly revision (Right) Active Problems:   Failed total knee arthroplasty ([place])  Estimated body mass index is 45.83 kg/m as calculated from the following:   Height as of this encounter: 5\' 4"  (1.626 m).   Weight as of this encounter: 121.1 kg. Advance diet Up with therapy D/C IV fluids  Anticipated LOS equal to or greater than 2 midnights due to - Age 57 and older with one or more of the following:  - Obesity  - Expected need for hospital services (PT, OT, Nursing) required for safe  discharge  - Anticipated need for postoperative skilled nursing care or inpatient rehab  - Active co-morbidities: None OR   - Unanticipated findings during/Post Surgery: None  - Patient is a high risk of re-admission due to: None    DVT Prophylaxis - Aspirin  Weight bearing as tolerated. D/C O2 and pulse ox and try on room air. Hemovac pulled without difficulty, will begin therapy today.  Plan is to go Home after hospital stay. Plan for discharge today as long as she is meeting goals with therapy. Scheduled for outpatient therapy at [facility]. Follow up in the office in 2 weeks.  Griffith Citron, PA-C Orthopedic Surgery 08/03/2018, 7:10 AM

## 2018-08-04 NOTE — Discharge Summary (Signed)
Physician Discharge Summary   Patient ID: Marissa [name] MRN: [id number] DOB/AGE: 1960-12-04 58 y.o.  Admit date: 08/02/2018 Discharge date: 08/03/2018  Primary Diagnosis: Unstable right total knee arthroplasty  Admission Diagnoses:  Past Medical History:  Diagnosis Date  . Abnormal uterine bleeding    due to fibroids  . Anxiety   . Arthritis    knee, scheduled for knee replacement 04/2016  . Asthma    exercise induced-no inhalers  . Complication of anesthesia   . Dysmenorrhea    2000  from fibroids  . Family history of adverse reaction to anesthesia 02/26/2016   father had shoulder replacement-admitted back to hospital for related complications  . Fibroid   . Genital warts 1994   Tx  x2  . GERD (gastroesophageal reflux disease)   . Head injury with loss of consciousness ([place]) 1986  . Hyperlipidemia   . PONV (postoperative nausea and vomiting)   . Primary localized osteoarthrosis of the knee, right   . Sleep apnea    has a cpap-cannot use-makes her sick  . Unilateral post-traumatic osteoarthritis, right knee 04/21/2016   Discharge Diagnoses:   Active Problems:   Failed total knee arthroplasty ([place])  Estimated body mass index is 45.83 kg/m as calculated from the following:   Height as of this encounter: 5\' 4"  (1.626 m).   Weight as of this encounter: 121.1 kg.  Procedure:  Procedure(s) (LRB): Right total knee arthroplasty poly revision (Right)   Consults: None  HPI: The patient is a 58 year old female who had a right total knee arthroplasty done a few years ago and has had persistent pain and instability in the knee.  Exam showed an unstable knee.  There were no signs of any periprosthetic loosening.  She has failed nonoperative management and presents now for polyethylene versus total knee revision.  Laboratory Data: Admission on 08/02/2018, Discharged on 08/03/2018  Component Date Value Ref Range Status  . WBC 08/03/2018 16.1* 4.0 - 10.5 K/uL Final  . RBC  08/03/2018 3.88  3.87 - 5.11 MIL/uL Final  . Hemoglobin 08/03/2018 12.7  12.0 - 15.0 g/dL Final  . HCT 08/03/2018 38.5  36.0 - 46.0 % Final  . MCV 08/03/2018 99.2  80.0 - 100.0 fL Final  . MCH 08/03/2018 32.7  26.0 - 34.0 pg Final  . MCHC 08/03/2018 33.0  30.0 - 36.0 g/dL Final  . RDW 08/03/2018 12.0  11.5 - 15.5 % Final  . Platelets 08/03/2018 302  150 - 400 K/uL Final  . nRBC 08/03/2018 0.0  0.0 - 0.2 % Final   Performed at [facility], [place] [address] [place], [place] [address]  . Sodium 08/03/2018 138  135 - 145 mmol/L Final  . Potassium 08/03/2018 4.8  3.5 - 5.1 mmol/L Final  . Chloride 08/03/2018 106  98 - 111 mmol/L Final  . CO2 08/03/2018 24  22 - 32 mmol/L Final  . Glucose, Bld 08/03/2018 144* 70 - 99 mg/dL Final  . BUN 08/03/2018 13  6 - 20 mg/dL Final  . Creatinine, Ser 08/03/2018 0.88  0.44 - 1.00 mg/dL Final  . Calcium 08/03/2018 9.1  8.9 - 10.3 mg/dL Final  . GFR calc non Af Amer 08/03/2018 >60  >60 mL/min Final  . GFR calc Af Amer 08/03/2018 >60  >60 mL/min Final  . Anion gap 08/03/2018 8  5 - 15 Final   Performed at [facility], [place] [address] [place], [place] [address]  Hospital Outpatient Visit on 07/26/2018  Component Date Value Ref Range Status  . aPTT 07/26/2018 31  24 - 36 seconds Final   Performed at [facility], [place] [address] [place], [place] [address]  . WBC 07/26/2018 6.4  4.0 - 10.5 K/uL Final  . RBC 07/26/2018 4.26  3.87 - 5.11 MIL/uL Final  . Hemoglobin 07/26/2018 13.8  12.0 - 15.0 g/dL Final  . HCT 07/26/2018 41.8  36.0 - 46.0 % Final  . MCV 07/26/2018 98.1  80.0 - 100.0 fL Final  . MCH 07/26/2018 32.4  26.0 - 34.0 pg Final  . MCHC 07/26/2018 33.0  30.0 - 36.0 g/dL Final  . RDW 07/26/2018 12.1  11.5 - 15.5 % Final  . Platelets 07/26/2018 281  150 - 400 K/uL Final  . nRBC 07/26/2018 0.0  0.0 - 0.2 % Final   Performed at [facility], [place] [address], [place], [place]  [address]  . Sodium 07/26/2018 139  135 - 145 mmol/L Final  . Potassium 07/26/2018 3.7  3.5 - 5.1 mmol/L Final  . Chloride 07/26/2018 104  98 - 111 mmol/L Final  . CO2 07/26/2018 28  22 - 32 mmol/L Final  . Glucose, Bld 07/26/2018 151* 70 - 99 mg/dL Final  . BUN 07/26/2018 17  6 - 20 mg/dL Final  . Creatinine, Ser 07/26/2018 0.94  0.44 - 1.00 mg/dL Final  . Calcium 07/26/2018 9.5  8.9 - 10.3 mg/dL Final  . Total Protein 07/26/2018 6.8  6.5 - 8.1 g/dL Final  . Albumin 07/26/2018 4.4  3.5 - 5.0 g/dL Final  . AST 07/26/2018 31  15 - 41 U/L Final  . ALT 07/26/2018 44  0 - 44 U/L Final  . Alkaline Phosphatase 07/26/2018 76  38 - 126 U/L Final  . Total Bilirubin 07/26/2018 0.5  0.3 - 1.2 mg/dL Final  . GFR calc non Af Amer 07/26/2018 >60  >60 mL/min Final  . GFR calc Af Amer 07/26/2018 >60  >60 mL/min Final  . Anion gap 07/26/2018 7  5 - 15 Final   Performed at [facility], [place] [address] [place], [place] [address]  . Prothrombin Time 07/26/2018 12.7  11.4 - 15.2 seconds Final  . INR 07/26/2018 0.96   Final   Performed at [place] [address], [place], [place] [address]  . ABO/RH(D) 07/26/2018 A POS   Final  . Antibody Screen 07/26/2018 NEG   Final  . Sample Expiration 07/26/2018 08/05/2018   Final  . Extend sample reason 07/26/2018    Final                   Value:NO TRANSFUSIONS OR PREGNANCY IN THE PAST 3 MONTHS Performed at [facility], [place] [address] [place], [place] [address]   . MRSA, PCR 07/26/2018 NEGATIVE  NEGATIVE Final  . Staphylococcus aureus 07/26/2018 NEGATIVE  NEGATIVE Final   Comment: (NOTE) The Xpert SA Assay (FDA approved for NASAL specimens in patients 105 years of age and older), is one component of a comprehensive surveillance program. It is not intended to diagnose infection nor to guide or monitor treatment. Performed at [facility], [place] [address], [place], [place] [address]     . ABO/RH(D) 07/26/2018    Final                   Value:A POS Performed at [facility], [place] [address], [place],  [address]  X-Rays:No results found.  EKG: Orders placed or performed in visit on 04/23/16  . EKG 12-Lead     Hospital Course: KAMORIE ALDOUS is a 58 y.o. who was admitted to [facility]. They were brought to the operating room on 08/02/2018 and underwent Procedure(s): Right total knee arthroplasty poly revision.  Patient tolerated the procedure well and was later transferred to the recovery room and then to the orthopaedic floor for postoperative care. They were given PO and IV analgesics for pain control following their surgery. They were given 24 hours of postoperative antibiotics of  Anti-infectives (From admission, onward)   Start     Dose/Rate Route Frequency Ordered Stop   08/02/18 1500  ceFAZolin (ANCEF) IVPB 2g/100 mL premix     2 g 200 mL/hr over 30 Minutes Intravenous Every 6 hours 08/02/18 1216 08/02/18 2232   08/02/18 0600  ceFAZolin (ANCEF) 3 g in dextrose 5 % 50 mL IVPB     3 g 100 mL/hr over 30 Minutes Intravenous 30 min pre-op 08/01/18 1005 08/02/18 0926     and started on DVT prophylaxis in the form of Aspirin.   PT and OT were ordered for total joint protocol. Discharge planning consulted to help with postop disposition and equipment needs.  Patient had a good night on the evening of surgery. They started to get up OOB with therapy on POD #0. Pt was seen during rounds and was ready to go home pending progress with therapy. Hemovac drain was pulled without difficulty. She worked with therapy on POD #1 and was meeting her goals. Pt was discharged to home later that day in stable condition.   Diet: Regular diet Activity: WBAT Follow-up: in 2 weeks Disposition: Home Discharged Condition: good   Discharge Instructions    Call MD / Call 911   Complete by:  As directed    If you experience chest pain or shortness of  breath, CALL 911 and be transported to the hospital emergency room.  If you develope a fever above 101 F, pus (white drainage) or increased drainage or redness at the wound, or calf pain, call your surgeon's office.   Change dressing   Complete by:  As directed    Change dressing on Friday, then change the dressing daily with sterile 4 x 4 inch gauze dressing and apply TED hose.   Constipation Prevention   Complete by:  As directed    Drink plenty of fluids.  Prune juice may be helpful.  You may use a stool softener, such as Colace (over the counter) 100 mg twice a day.  Use MiraLax (over the counter) for constipation as needed.   Diet - low sodium heart healthy   Complete by:  As directed    Do not put a pillow under the knee. Place it under the heel.   Complete by:  As directed    Driving restrictions   Complete by:  As directed    No driving for two weeks   TED hose   Complete by:  As directed    Use stockings (TED hose) for three weeks on both leg(s).  You may remove them at night for sleeping.   Weight bearing as tolerated   Complete by:  As directed      Allergies as of 08/03/2018      Reactions   Statins Itching, Rash, Other (See Comments)   Muscle and joint      Medication List    STOP taking  these medications   ibuprofen 800 MG tablet Commonly known as:  ADVIL,MOTRIN     TAKE these medications   acetaminophen 500 MG tablet Commonly known as:  TYLENOL Take 1,000 mg by mouth every 6 (six) hours as needed for moderate pain or headache.   aspirin 325 MG EC tablet Take 1 tablet (325 mg total) by mouth 2 (two) times daily for 21 days. Then take Aspirin 81 mg once daily for three weeks. Then discontinue Aspirin.   calcium carbonate 750 MG chewable tablet Commonly known as:  TUMS EX Chew 1 tablet by mouth at bedtime.   ezetimibe 10 MG tablet Commonly known as:  ZETIA Take 10 mg by mouth daily.   methocarbamol 500 MG tablet Commonly known as:  ROBAXIN Take 1 tablet  (500 mg total) by mouth every 6 (six) hours as needed for muscle spasms.   MULTIVITAMIN WOMEN PO Take 1 tablet by mouth daily.   oxyCODONE 5 MG immediate release tablet Commonly known as:  Oxy IR/ROXICODONE Take 1-2 tablets (5-10 mg total) by mouth every 6 (six) hours as needed for severe pain.   traMADol 50 MG tablet Commonly known as:  ULTRAM Take 1-2 tablets (50-100 mg total) by mouth every 6 (six) hours as needed for moderate pain.   ZEGERID OTC 20-1100 MG Caps capsule Generic drug:  Omeprazole-Sodium Bicarbonate Take 1 capsule by mouth daily as needed (for acid reflux).            Discharge Care Instructions  (From admission, onward)         Start     Ordered   08/03/18 0000  Weight bearing as tolerated     08/03/18 0720   08/03/18 0000  Change dressing    Comments:  Change dressing on Friday, then change the dressing daily with sterile 4 x 4 inch gauze dressing and apply TED hose.   08/03/18 0720         Follow-up Information    Gaynelle Arabian, MD. Schedule an appointment as soon as possible for a visit on 08/17/2018.   Specialty:  Orthopedic Surgery Contact information: [address] [place] [address] [phone]           Signed: Griffith Citron, PA-C Orthopedic Surgery 08/04/2018, 2:25 PM

## 2019-03-14 ENCOUNTER — Other Ambulatory Visit: Payer: Self-pay | Admitting: Obstetrics and Gynecology

## 2019-03-14 DIAGNOSIS — Z1231 Encounter for screening mammogram for malignant neoplasm of breast: Secondary | ICD-10-CM

## 2019-04-26 ENCOUNTER — Ambulatory Visit
Admission: RE | Admit: 2019-04-26 | Discharge: 2019-04-26 | Disposition: A | Discharge status: Home / Self Care | Payer: 59 | Source: Ambulatory Visit | Attending: Obstetrics and Gynecology | Admitting: Obstetrics and Gynecology

## 2019-04-26 ENCOUNTER — Other Ambulatory Visit: Payer: Self-pay

## 2019-04-26 DIAGNOSIS — Z1231 Encounter for screening mammogram for malignant neoplasm of breast: Secondary | ICD-10-CM

## 2019-04-30 ENCOUNTER — Other Ambulatory Visit: Payer: Self-pay | Admitting: Obstetrics and Gynecology

## 2019-04-30 DIAGNOSIS — N644 Mastodynia: Secondary | ICD-10-CM

## 2019-05-01 ENCOUNTER — Telehealth: Payer: Self-pay | Admitting: Obstetrics and Gynecology

## 2019-05-01 NOTE — Telephone Encounter (Signed)
Left message to call Sharee Pimple, RN at [facility].    Last AEX 03/06/18

## 2019-05-01 NOTE — Telephone Encounter (Signed)
Please contact patient to schedule an annual exam with me.  I received an order for a diagnostic mammogram due to breast pain.  She needs to be see for an annual exam and breast check before proceeding with a dx study.

## 2019-05-02 ENCOUNTER — Other Ambulatory Visit: Payer: Self-pay

## 2019-05-02 NOTE — Telephone Encounter (Signed)
Pt has scheduled AEX 11/16 with Dr Quincy Simmonds.  Routing to provider for final review. Patient is agreeable to disposition. Will close encounter.

## 2019-05-02 NOTE — Telephone Encounter (Signed)
Patient returned call. AEX scheduled for Monday 11/16.

## 2019-05-07 ENCOUNTER — Encounter: Payer: Self-pay | Admitting: Obstetrics and Gynecology

## 2019-05-07 ENCOUNTER — Ambulatory Visit: Payer: 59 | Admitting: Obstetrics and Gynecology

## 2019-05-07 ENCOUNTER — Other Ambulatory Visit: Payer: Self-pay

## 2019-05-07 VITALS — BP 142/84 | HR 60 | Temp 97.3°F | Resp 16 | Ht 64.0 in | Wt 269.6 lb

## 2019-05-07 DIAGNOSIS — Z01419 Encounter for gynecological examination (general) (routine) without abnormal findings: Secondary | ICD-10-CM | POA: Diagnosis not present

## 2019-05-07 MED ORDER — PAROXETINE HCL 10 MG PO TABS: 10.0000 mg | ORAL_TABLET | ORAL | 1 refills | Status: DC

## 2019-05-07 NOTE — Progress Notes (Addendum)
58 y.o. G0P0 [name] here for annual exam.  Patient having left breast pain chronically. More uncomfortable in the last several months.   States she has some cysts in her left breast.  States it feels lumpy. No trauma or increased activity.  Denies chest pain.   Having hot flashes.   Having chronic right knee pain.  She used Gabapentin in the past.   Took Paxil a long time ago.  She states she took an antidepressant many years ago and developed a rash on her back.  No allergy noted on chart review.  Does currently have stress.   PCP: Joneen Boers, MD    Patient's last menstrual period was 08/20/2002.           Sexually active: No.  The current method of family planning is status post hysterectomy.    Exercising: Yes.    walking and weights Smoker:  no  Health Maintenance: Pap: 12-24-15 Neg:Neg HR HPV, 12-30-09 Neg History of abnormal Pap:  no MMG: 03-06-18 3D/Neg/density C/BiRads1--appt. 05-10-19 Colonoscopy: 2017 polyps;next 2022 BMD:   n/a  Result  n/a TDaP: 03-06-18 Gardasil:   no HIV: 12-24-15 NR Hep C: 12-24-15 Neg Screening Labs:   PCP. Flu vaccine:  Completed.   reports that she has never smoked. She has never used smokeless tobacco. She reports current alcohol use of about 5.0 standard drinks of alcohol per week. She reports that she does not use drugs.  Past Medical History:  Diagnosis Date  . Abnormal uterine bleeding    due to fibroids  . Anxiety   . Arthritis    knee, scheduled for knee replacement 04/2016  . Asthma    exercise induced-no inhalers  . Complication of anesthesia   . Dysmenorrhea    2000  from fibroids  . Family history of adverse reaction to anesthesia 02/26/2016   father had shoulder replacement-admitted back to hospital for related complications  . Fibroid   . Genital warts 1994   Tx  x2  . GERD (gastroesophageal reflux disease)   . Head injury with loss of consciousness ([place]) 1986  . Hyperlipidemia   . PONV  (postoperative nausea and vomiting)   . Primary localized osteoarthrosis of the knee, right   . Sleep apnea    has a cpap-cannot use-makes her sick  . Unilateral post-traumatic osteoarthritis, right knee 04/21/2016    Past Surgical History:  Procedure Laterality Date  . ABDOMINAL HYSTERECTOMY  09/11/2002   ovaries retained. 2o fibroids  . APPENDECTOMY  1982  . BREAST EXCISIONAL BIOPSY Left 09/1995  . BREAST SURGERY  1997   cyst left breast rem. benign  . COLONOSCOPY  06/2015   polyp recheck 5 yrs  . EXCISION VAGINAL CYST N/A 03/02/2016   Procedure: excision of vaginal polyp;  Surgeon: Nunzio Cobbs, MD;  Location: [facility];  Service: Gynecology;  Laterality: N/A;  . KNEE ARTHROSCOPY Right 07/16/2013   Procedure: RIGHT ARTHROSCOPY KNEE, partial lateral menisectomy and chrondromalasia, with microfracture of lateral femeral chondyl, and excision of plica ;  Surgeon: Kerin Salen, MD;  Location: [facility];  Service: Orthopedics;  Laterality: Right;  . KNEE ARTHROSCOPY Right 07/05/2014  . LIPOMA EXCISION    . RECONSTRUCTION OF NOSE  1986  . SHOULDER ARTHROSCOPY WITH ROTATOR CUFF REPAIR AND SUBACROMIAL DECOMPRESSION Left 08/02/2014   Procedure: SHOULDER ARTHROSCOPY WITH ROTATOR CUFF REPAIR AND SUBACROMIAL DECOMPRESSION;  Surgeon: Hessie Dibble, MD;  Location: [facility];  Service: Orthopedics;  Laterality: Left;  . SHOULDER SURGERY Right 2003   rt  . STERIOD INJECTION Left 08/02/2014   Procedure: LEFT THUMB INJECTION;  Surgeon: Hessie Dibble, MD;  Location: [facility];  Service: Orthopedics;  Laterality: Left;  . TOTAL KNEE ARTHROPLASTY Right 05/03/2016   Procedure: RIGHT TOTAL KNEE ARTHROPLASTY;  Surgeon: Elsie Saas, MD;  Location: [facility];  Service: Orthopedics;  Laterality: Right;  . TOTAL KNEE REVISION Right 08/02/2018   Procedure: Right total knee arthroplasty poly revision;  Surgeon: Gaynelle Arabian, MD;  Location: [facility];   Service: Orthopedics;  Laterality: Right;  6min    Current Outpatient Medications  Medication Sig Dispense Refill  . ezetimibe (ZETIA) 10 MG tablet Take 10 mg by mouth daily.     . Multiple Vitamins-Minerals (MULTIVITAMIN WOMEN PO) Take 1 tablet by mouth daily.    Earney Navy Bicarbonate (ZEGERID OTC) 20-1100 MG CAPS capsule Take 1 capsule by mouth daily as needed (for acid reflux).     No current facility-administered medications for this visit.     Family History  Problem Relation Age of Onset  . Diabetes Mother   . Hyperlipidemia Mother   . Hypertension Mother   . Ulcers Mother   . Fibromyalgia Mother   . Sudden death Maternal Grandfather   . Cancer Maternal Grandmother   . Cancer Paternal Grandmother        breast/liver  . Breast cancer Paternal Grandmother   . Hyperlipidemia Father   . Seizures Father   . Hypertension Father   . Dementia Father   . Atrial fibrillation Father   . Hyperlipidemia Brother   . Heart attack Neg Hx     Review of Systems  All other systems reviewed and are negative.   Exam:   BP (!) 142/84 (Cuff Size: Large)   Pulse 60   Temp (!) 97.3 F (36.3 C) (Temporal)   Resp 16   Ht 5\' 4"  (1.626 m)   Wt 269 lb 9.6 oz (122.3 kg)   LMP 08/20/2002   BMI 46.28 kg/m     General appearance: alert, cooperative and appears stated age Head: normocephalic, without obvious abnormality, atraumatic Neck: no adenopathy, supple, symmetrical, trachea midline and thyroid normal to inspection and palpation Lungs: clear to auscultation bilaterally Breasts: normal appearance, no masses or tenderness, No nipple retraction or dimpling, No nipple discharge or bleeding, No axillary adenopathy Heart: regular rate and rhythm Abdomen: soft, non-tender; no masses, no organomegaly Extremities: extremities normal, atraumatic, no cyanosis or edema Skin: skin color, texture, turgor normal. No rashes or lesions Lymph nodes: cervical, supraclavicular, and axillary  nodes normal. Neurologic: grossly normal  Pelvic: External genitalia:  no lesions              No abnormal inguinal nodes palpated.              Urethra:  normal appearing urethra with no masses, tenderness or lesions              Bartholins and Skenes: normal                 Vagina: normal appearing vagina with normal color and discharge, no lesions              Cervix:  absent              Pap taken: No. Bimanual Exam:  Uterus:   absent              Adnexa: no mass,  fullness, tenderness              Rectal exam: Yes.  .  Confirms.              Anus:  normal sphincter tone, no lesions  Chaperone was present for exam.  Assessment:   Well woman visit with normal exam. Status post TAH. Ovaries remain.  Hot flashes.  Weight gain.  Left breast pain.   Plan: Mammogram bilateral dx and left breast US already scheduled.  Self breast awareness reviewed. Pap and HR HPV as above. Guidelines for Calcium, Vitamin D, regular exercise program including cardiovascular and weight bearing exercise. We discussed Paxil and Neurontin.  She will try Paxil 10 mg.  #30, RF one. I discussed potential side effects of weight gain and decreased libido.  Fu in 7 weeks.  We discussed weight loss through a physician based program at [facility] or [facility].  Follow up annually and prn.   After visit summary provided.

## 2019-05-10 ENCOUNTER — Other Ambulatory Visit: Payer: Self-pay

## 2019-05-10 ENCOUNTER — Ambulatory Visit
Admission: RE | Admit: 2019-05-10 | Discharge: 2019-05-10 | Disposition: A | Discharge status: Home / Self Care | Payer: 59 | Source: Ambulatory Visit | Attending: Obstetrics and Gynecology | Admitting: Obstetrics and Gynecology

## 2019-05-10 ENCOUNTER — Ambulatory Visit: Payer: 59

## 2019-05-10 DIAGNOSIS — N644 Mastodynia: Secondary | ICD-10-CM

## 2019-06-26 ENCOUNTER — Encounter: Payer: Self-pay | Admitting: Obstetrics and Gynecology

## 2019-06-26 ENCOUNTER — Telehealth (INDEPENDENT_AMBULATORY_CARE_PROVIDER_SITE_OTHER): Payer: 59 | Admitting: Obstetrics and Gynecology

## 2019-06-26 DIAGNOSIS — N951 Menopausal and female climacteric states: Secondary | ICD-10-CM | POA: Diagnosis not present

## 2019-06-26 MED ORDER — PAROXETINE HCL 20 MG PO TABS: 20.0000 mg | ORAL_TABLET | ORAL | 0 refills | Status: DC

## 2019-06-26 NOTE — Progress Notes (Signed)
GYNECOLOGY  VISIT   HPI: 59 y.o.   Single  Caucasian  female   G0P0 with Patient's last menstrual period was 08/20/2002.   here for medication follow up.  Taking Paxil 10 mg daily, her second month.   We are doing a telephone visit as the My chart video visit did not connect.  She gives permission for the visit.  She is outside her church.  I am at the office.  Started at 4:24.   Ended at 4:34.   Feeling less stressed on the Paxil. Still having hot flashes.  She took Gabapentin in the past and did not relief from hot flashes.   Started a new diet for weight loss, [place].  Doing 6 small meals a day.   GYNECOLOGIC HISTORY: Patient's last menstrual period was 08/20/2002. Contraception: Hysterectomy--ovaries remain Menopausal hormone therapy:  n/a Last mammogram: 05-10-19 Diag.Bil./Neg/density C/screening 35yr./BiRads1 Last pap smear:  12-24-15 Neg:Neg HR HPV, 12-30-09 Neg        OB History    Gravida  0   Para      Term      Preterm      AB      Living        SAB      TAB      Ectopic      Multiple      Live Births                 Patient Active Problem List   Diagnosis Date Noted  . 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 Post-traumatic osteoarthritis of right knee 05/03/2016  . Unilateral post-traumatic osteoarthritis, right knee 04/21/2016  . Primary localized osteoarthrosis of the knee, right   . Colon polyp 03/23/2013  . Asthma, mild intermittent 01/25/2013  . Class 3 obesity without serious comorbidity with body mass index (BMI) of 40.0 to 44.9 in adult 01/25/2013  . Cough 08/16/2011  . Right hand pain 01/18/2011  . Obstructive sleep apnea 07/10/2010  . Intrinsic asthma 06/25/2010  . DYSPNEA ON EXERTION 05/14/2010  . Hyperlipidemia 07/14/2007  . GERD 07/14/2007  . Diaphragmatic hernia 04/06/2007    Past Medical History:  Diagnosis Date  . Abnormal uterine bleeding    due to fibroids  . Anxiety   . Arthritis    knee,  scheduled for knee replacement 04/2016  . Asthma    exercise induced-no inhalers  . Complication of anesthesia   . Dysmenorrhea    2000  from fibroids  . Family history of adverse reaction to anesthesia 02/26/2016   father had shoulder replacement-admitted back to hospital for related complications  . Fibroid   . Genital warts 1994   Tx  x2  . GERD (gastroesophageal reflux disease)   . Head injury with loss of consciousness ([place]) 1986  . Hyperlipidemia   . PONV (postoperative nausea and vomiting)   . Primary localized osteoarthrosis of the knee, right   . Sleep apnea    has a cpap-cannot use-makes her sick  . Unilateral post-traumatic osteoarthritis, right knee 04/21/2016    Past Surgical History:  Procedure Laterality Date  . ABDOMINAL HYSTERECTOMY  09/11/2002   ovaries retained. 2o fibroids  . APPENDECTOMY  1982  . BREAST EXCISIONAL BIOPSY Left 09/1995  . BREAST SURGERY  1997   cyst left breast rem. benign  . COLONOSCOPY  06/2015   polyp recheck 5 yrs  . EXCISION VAGINAL CYST N/A 03/02/2016   Procedure: excision of vaginal polyp;  Surgeon:  Elyas Villamor Oletta Lamas, MD;  Location: [facility];  Service: Gynecology;  Laterality: N/A;  . KNEE ARTHROSCOPY Right 07/16/2013   Procedure: RIGHT ARTHROSCOPY KNEE, partial lateral menisectomy and chrondromalasia, with microfracture of lateral femeral chondyl, and excision of plica ;  Surgeon: Kerin Salen, MD;  Location: [facility];  Service: Orthopedics;  Laterality: Right;  . KNEE ARTHROSCOPY Right 07/05/2014  . LIPOMA EXCISION    . RECONSTRUCTION OF NOSE  1986  . SHOULDER ARTHROSCOPY WITH ROTATOR CUFF REPAIR AND SUBACROMIAL DECOMPRESSION Left 08/02/2014   Procedure: SHOULDER ARTHROSCOPY WITH ROTATOR CUFF REPAIR AND SUBACROMIAL DECOMPRESSION;  Surgeon: Hessie Dibble, MD;  Location: [facility];  Service: Orthopedics;  Laterality: Left;  . SHOULDER SURGERY Right 2003   rt  . STERIOD INJECTION Left 08/02/2014    Procedure: LEFT THUMB INJECTION;  Surgeon: Hessie Dibble, MD;  Location: [facility];  Service: Orthopedics;  Laterality: Left;  . TOTAL KNEE ARTHROPLASTY Right 05/03/2016   Procedure: RIGHT TOTAL KNEE ARTHROPLASTY;  Surgeon: Elsie Saas, MD;  Location: [facility];  Service: Orthopedics;  Laterality: Right;  . TOTAL KNEE REVISION Right 08/02/2018   Procedure: Right total knee arthroplasty poly revision;  Surgeon: Gaynelle Arabian, MD;  Location: [facility];  Service: Orthopedics;  Laterality: Right;  89min    Current Outpatient Medications  Medication Sig Dispense Refill  . ezetimibe (ZETIA) 10 MG tablet Take 10 mg by mouth daily.     . Multiple Vitamins-Minerals (MULTIVITAMIN WOMEN PO) Take 1 tablet by mouth daily.    Earney Navy Bicarbonate (ZEGERID OTC) 20-1100 MG CAPS capsule Take 1 capsule by mouth daily as needed (for acid reflux).    Marland Kitchen PARoxetine (PAXIL) 10 MG tablet Take 1 tablet (10 mg total) by mouth every morning. 30 tablet 1   No current facility-administered medications for this visit.     ALLERGIES: Statins  Family History  Problem Relation Age of Onset  . Diabetes Mother   . Hyperlipidemia Mother   . Hypertension Mother   . Ulcers Mother   . Fibromyalgia Mother   . Sudden death Maternal Grandfather   . Cancer Maternal Grandmother   . Cancer Paternal Grandmother        breast/liver  . Breast cancer Paternal Grandmother   . Hyperlipidemia Father   . Seizures Father   . Hypertension Father   . Dementia Father   . Atrial fibrillation Father   . Hyperlipidemia Brother   . Heart attack Neg Hx     Social History   Socioeconomic History  . Marital status: Single    Spouse name: Not on file  . Number of children: Not on file  . Years of education: Not on file  . Highest education level: Not on file  Occupational History  . Not on file  Tobacco Use  . Smoking status: Never Smoker  . Smokeless tobacco: Never Used  Substance and Sexual Activity   . Alcohol use: Yes    Alcohol/week: 5.0 standard drinks    Types: 3 Glasses of wine, 1 Cans of beer, 1 Shots of liquor per week    Comment: weekly;  . Drug use: No  . Sexual activity: Not Currently    Partners: Male    Birth control/protection: Surgical    Comment: Hysterectomy  Other Topics Concern  . Not on file  Social History Narrative  . Not on file   Social Determinants of Health   Financial Resource Strain:   .  Difficulty of Paying Living Expenses: Not on file  Food Insecurity:   . Worried About Charity fundraiser in the Last Year: Not on file  . Ran Out of Food in the Last Year: Not on file  Transportation Needs:   . Lack of Transportation (Medical): Not on file  . Lack of Transportation (Non-Medical): Not on file  Physical Activity:   . Days of Exercise per Week: Not on file  . Minutes of Exercise per Session: Not on file  Stress:   . Feeling of Stress : Not on file  Social Connections:   . Frequency of Communication with Friends and Family: Not on file  . Frequency of Social Gatherings with Friends and Family: Not on file  . Attends Religious Services: Not on file  . Active Member of Clubs or Organizations: Not on file  . Attends Archivist Meetings: Not on file  . Marital Status: Not on file  Intimate Partner Violence:   . Fear of Current or Ex-Partner: Not on file  . Emotionally Abused: Not on file  . Physically Abused: Not on file  . Sexually Abused: Not on file    Review of Systems  All other systems reviewed and are negative.   PHYSICAL EXAMINATION:    LMP 08/20/2002       ASSESSMENT  Menopausal symptoms.  On Paxil low dose.  BMI 46.28.  PLAN  We discussed options of increasing her Paxil or switching to Gabapentin or Effexor. Will increase Paxil to 20 mg daily.  #90, RF none.  She will let me know if this is giving her improvement or not before her Rx runs out.  She plans to increase her activity along with her new eating plan.    An After Visit Summary was printed and given to the patient.  __10____ minutes face to face time on phone of which over 50% was spent in counseling.

## 2019-07-05 ENCOUNTER — Other Ambulatory Visit: Payer: Self-pay | Admitting: Obstetrics and Gynecology

## 2019-09-18 ENCOUNTER — Telehealth: Payer: Self-pay | Admitting: Obstetrics and Gynecology

## 2019-09-18 ENCOUNTER — Other Ambulatory Visit: Payer: Self-pay | Admitting: Obstetrics and Gynecology

## 2019-09-18 NOTE — Telephone Encounter (Signed)
Please contact patient in response to her concern that Paxil is not working well for her and that she would like to discontinue.   I recommend she reduce her dosage of Paxil to 10 mg daily for one month and return for an office visit to discuss alternative treatments for menopausal symptoms.   Rx for Paxil 10 mg Sig:  1 po q day.  Disp:  30 RF: one.

## 2019-09-18 NOTE — Telephone Encounter (Signed)
Patient is returning call to stephanie.

## 2019-09-18 NOTE — Telephone Encounter (Signed)
Medication refill request: Paroxetine ( PAXIL) 20 MG tablet  Last AEX: 05/07/2019 Next AEX: 05/14/2020 Last MMG (if hormonal medication request): 05/10/2019 Refill authorized: today,  #90, RF0 pended.   Please advise and refill if appropriate.

## 2019-09-18 NOTE — Telephone Encounter (Signed)
Please contact patient to see how she is doing on the Paxil 20 mg.  I increased her dosage from 10 to 20 mg.  I can do her refill after I understand if this is working better for her.

## 2019-09-18 NOTE — Telephone Encounter (Signed)
See phone note

## 2019-09-18 NOTE — Telephone Encounter (Signed)
Left message for pt to call back to [facility] in triage for med update.

## 2019-09-18 NOTE — Telephone Encounter (Signed)
Spoke to pt. Pt states Paxil is not working and wants to know how to get off Paxil since have a few pills left and then maybe trying the gabapentin again or another med alternative since still having hot flashes.  Last AEX was 05/07/2019.   Routing to Dr Quincy Simmonds for recommendations and advice. Rx for Paxil refused.

## 2019-09-19 NOTE — Telephone Encounter (Signed)
Spoke with patient, advised per Dr. Quincy Simmonds. Patient agreeable to reduce Paxil to 10 mg PO daily for 30 days, does not need a new Rx, will take 1/2 of 20 mg tab. Patient declines to schedule OV at this time, will return call at later date to schedule. Patient is aware to call if any questions/concerns.   Routing to provider for final review. Patient is agreeable to disposition. Will close encounter.

## 2020-04-16 ENCOUNTER — Other Ambulatory Visit: Payer: Self-pay | Admitting: Obstetrics and Gynecology

## 2020-04-16 DIAGNOSIS — Z1231 Encounter for screening mammogram for malignant neoplasm of breast: Secondary | ICD-10-CM

## 2020-04-28 ENCOUNTER — Emergency Department
Admission: EM | Admit: 2020-04-28 | Discharge: 2020-04-28 | Disposition: A | Discharge status: Home / Self Care | Payer: 59 | Source: Home / Self Care

## 2020-04-28 ENCOUNTER — Other Ambulatory Visit: Payer: Self-pay

## 2020-04-28 DIAGNOSIS — R590 Localized enlarged lymph nodes: Secondary | ICD-10-CM

## 2020-04-28 DIAGNOSIS — H66005 Acute suppurative otitis media without spontaneous rupture of ear drum, recurrent, left ear: Secondary | ICD-10-CM

## 2020-04-28 MED ORDER — CEFDINIR 300 MG PO CAPS: 600.0000 mg | ORAL_CAPSULE | Freq: Every day | ORAL | 0 refills | Status: DC

## 2020-04-28 MED ORDER — ACETAMINOPHEN 325 MG PO TABS
650.0000 mg | ORAL_TABLET | Freq: Once | ORAL | Status: AC
  Administered 2020-04-28: 650 mg via ORAL

## 2020-04-28 MED ORDER — PREDNISONE 20 MG PO TABS: 40.0000 mg | ORAL_TABLET | Freq: Every day | ORAL | 0 refills | Status: DC

## 2020-04-28 NOTE — ED Triage Notes (Signed)
Patient presents to Urgent Care with complaints of left ear pain since 4 days ago. Patient reports she had recent travel and feels like the pressure changes impacted the pain. Feels like there is swelling extending down the left side of her neck.

## 2020-04-28 NOTE — ED Provider Notes (Signed)
[name]    CSN: [id number] Arrival date & time: 04/28/20  0845      History   Chief Complaint Chief Complaint  Patient presents with  . Otalgia    Left    HPI Marissa [name] is a 59 y.o. female.   HPI  Patient with recent airplane travel in recent sea diving history presents today with bilateral otalgia.  Left ear is severely painful and swollen and has drained a mucus-like discharge.  She denies any loss of hearing although endorses some muffled hearing.  She endorses pain 10/10 involving the left ear which is now radiating into the left side of her neck.  She has history of ET dysfunction involving the left ear.  She has taken ibuprofen for pain without significant improvement.  Past Medical History:  Diagnosis Date  . Abnormal uterine bleeding    due to fibroids  . Anxiety   . Arthritis    knee, scheduled for knee replacement 04/2016  . Asthma    exercise induced-no inhalers  . Complication of anesthesia   . Dysmenorrhea    2000  from fibroids  . Family history of adverse reaction to anesthesia 02/26/2016   father had shoulder replacement-admitted back to hospital for related complications  . Fibroid   . Genital warts 1994   Tx  x2  . GERD (gastroesophageal reflux disease)   . Head injury with loss of consciousness ([place]) 1986  . Hyperlipidemia   . PONV (postoperative nausea and vomiting)   . Primary localized osteoarthrosis of the knee, right   . Sleep apnea    has a cpap-cannot use-makes her sick  . Unilateral post-traumatic osteoarthritis, right knee 04/21/2016    Patient Active Problem List   Diagnosis Date Noted  . 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 Post-traumatic osteoarthritis of right knee 05/03/2016  . Unilateral post-traumatic osteoarthritis, right knee 04/21/2016  . Primary localized osteoarthrosis of the knee, right   . Colon polyp 03/23/2013  . Asthma, mild intermittent 01/25/2013  . Class 3 obesity without  serious comorbidity with body mass index (BMI) of 40.0 to 44.9 in adult 01/25/2013  . Cough 08/16/2011  . Right hand pain 01/18/2011  . Obstructive sleep apnea 07/10/2010  . Intrinsic asthma 06/25/2010  . DYSPNEA ON EXERTION 05/14/2010  . Hyperlipidemia 07/14/2007  . GERD 07/14/2007  . Diaphragmatic hernia 04/06/2007    Past Surgical History:  Procedure Laterality Date  . ABDOMINAL HYSTERECTOMY  09/11/2002   ovaries retained. 2o fibroids  . APPENDECTOMY  1982  . BREAST EXCISIONAL BIOPSY Left 09/1995  . BREAST SURGERY  1997   cyst left breast rem. benign  . COLONOSCOPY  06/2015   polyp recheck 5 yrs  . EXCISION VAGINAL CYST N/A 03/02/2016   Procedure: excision of vaginal polyp;  Surgeon: Nunzio Cobbs, MD;  Location: [facility];  Service: Gynecology;  Laterality: N/A;  . KNEE ARTHROSCOPY Right 07/16/2013   Procedure: RIGHT ARTHROSCOPY KNEE, partial lateral menisectomy and chrondromalasia, with microfracture of lateral femeral chondyl, and excision of plica ;  Surgeon: Kerin Salen, MD;  Location: [facility];  Service: Orthopedics;  Laterality: Right;  . KNEE ARTHROSCOPY Right 07/05/2014  . LIPOMA EXCISION    . RECONSTRUCTION OF NOSE  1986  . SHOULDER ARTHROSCOPY WITH ROTATOR CUFF REPAIR AND SUBACROMIAL DECOMPRESSION Left 08/02/2014   Procedure: SHOULDER ARTHROSCOPY WITH ROTATOR CUFF REPAIR AND SUBACROMIAL DECOMPRESSION;  Surgeon: Hessie Dibble, MD;  Location: [facility];  Service: Orthopedics;  Laterality: Left;  . SHOULDER SURGERY Right 2003   rt  . STERIOD INJECTION Left 08/02/2014   Procedure: LEFT THUMB INJECTION;  Surgeon: Hessie Dibble, MD;  Location: [facility];  Service: Orthopedics;  Laterality: Left;  . TOTAL KNEE ARTHROPLASTY Right 05/03/2016   Procedure: RIGHT TOTAL KNEE ARTHROPLASTY;  Surgeon: Elsie Saas, MD;  Location: [facility];  Service: Orthopedics;  Laterality: Right;  . TOTAL KNEE REVISION Right 08/02/2018    Procedure: Right total knee arthroplasty poly revision;  Surgeon: Gaynelle Arabian, MD;  Location: [facility];  Service: Orthopedics;  Laterality: Right;  70min    OB History    Gravida  0   Para      Term      Preterm      AB      Living        SAB      TAB      Ectopic      Multiple      Live Births               Home Medications    Prior to Admission medications   Medication Sig Start Date End Date Taking? Authorizing Provider  ezetimibe (ZETIA) 10 MG tablet Take 10 mg by mouth daily.  02/07/17   [provider]  Multiple Vitamins-Minerals (MULTIVITAMIN WOMEN PO) Take 1 tablet by mouth daily.    [provider]  Omeprazole-Sodium Bicarbonate (ZEGERID OTC) 20-1100 MG CAPS capsule Take 1 capsule by mouth daily as needed (for acid reflux).    [provider]  PARoxetine (PAXIL) 20 MG tablet Take 1 tablet (20 mg total) by mouth every morning. 06/26/19   Nunzio Cobbs, MD    Family History Family History  Problem Relation Age of Onset  . Diabetes Mother   . Hyperlipidemia Mother   . Hypertension Mother   . Ulcers Mother   . Fibromyalgia Mother   . Sudden death Maternal Grandfather   . Cancer Maternal Grandmother   . Cancer Paternal Grandmother        breast/liver  . Breast cancer Paternal Grandmother   . Hyperlipidemia Father   . Seizures Father   . Hypertension Father   . Dementia Father   . Atrial fibrillation Father   . Hyperlipidemia Brother   . Heart attack Neg Hx     Social History Social History   Tobacco Use  . Smoking status: Never Smoker  . Smokeless tobacco: Never Used  Vaping Use  . Vaping Use: Never used  Substance Use Topics  . Alcohol use: Yes    Alcohol/week: 5.0 standard drinks    Types: 3 Glasses of wine, 1 Cans of beer, 1 Shots of liquor per week    Comment: weekly;  . Drug use: No     Allergies   Statins   Review of Systems Review of Systems Pertinent negatives listed in  HPI  Physical Exam Triage Vital Signs ED Triage Vitals  Enc Vitals Group     BP 04/28/20 0905 (!) 162/103     Pulse Rate 04/28/20 0905 64     Resp 04/28/20 0905 16     Temp 04/28/20 0905 98.6 F (37 C)     Temp Source 04/28/20 0905 Oral     SpO2 04/28/20 0905 97 %     Weight --      Height --      Head Circumference --  Peak Flow --      Pain Score 04/28/20 0904 10     Pain Loc --      Pain Edu? --      Excl. in [place]? --    No data found.  Updated Vital Signs BP (!) 162/103 (BP Location: Right Arm)   Pulse 64   Temp 98.6 F (37 C) (Oral)   Resp 16   LMP 08/20/2002   SpO2 97%   Visual Acuity Right Eye Distance:   Left Eye Distance:   Bilateral Distance:    Right Eye Near:   Left Eye Near:    Bilateral Near:     Physical Exam Constitutional:      Appearance: She is ill-appearing.  HENT:     Right Ear: Tympanic membrane normal.     Ears:     Comments: Unable to completely visualize left inner ear due to profound swelling and purulent drainage from left ear.  Preauricular adenopathy present left side    Nose: Nose normal.  Cardiovascular:     Rate and Rhythm: Normal rate and regular rhythm.  Pulmonary:     Effort: Pulmonary effort is normal.     Breath sounds: Normal breath sounds.  Lymphadenopathy:     Cervical: No cervical adenopathy.  Skin:    General: Skin is warm.     Capillary Refill: Capillary refill takes less than 2 seconds.  Neurological:     General: No focal deficit present.     Mental Status: She is alert and oriented to person, place, and time.  Psychiatric:        Mood and Affect: Mood normal.        Behavior: Behavior normal.        Thought Content: Thought content normal.        Judgment: Judgment normal.      UC Treatments / Results  Labs (all labs ordered are listed, but only abnormal results are displayed) Labs Reviewed - No data to display  EKG   Radiology No results found.  Procedures Procedures (including  critical care time)  Medications Ordered in UC Medications  acetaminophen (TYLENOL) tablet 650 mg (650 mg Oral Given 04/28/20 0909)    Initial Impression / Assessment and Plan / UC Course  I have reviewed the triage vital signs and the nursing notes.  Pertinent labs & imaging results that were available during my care of the patient were reviewed by me and considered in my medical decision making (see chart for details).     Acute otitis media with suppurative changes therefore will cover with prednisone 40 mg once daily and Omnicef 600 mg once daily.  Patient has significant swelling preauricular adenopathy. Advised if any symptoms worsen or do not improve follow-up with primary care provider or return for reevaluation.  Patient is afebrile and stable. Final Clinical Impressions(s) / UC Diagnoses   Final diagnoses:  Recurrent acute suppurative otitis media without spontaneous rupture of left tympanic membrane  Preauricular adenopathy     Discharge Instructions     Complete medication as prescribed.  If any of your symptoms worsen or do not improve follow-up with primary care provider or return for evaluation.    ED Prescriptions    None     PDMP not reviewed this encounter.   Scot Jun, Watson 04/28/20 [phone]

## 2020-04-28 NOTE — Discharge Instructions (Addendum)
Complete medication as prescribed.  If any of your symptoms worsen or do not improve follow-up with primary care provider or return for evaluation.

## 2020-05-14 ENCOUNTER — Other Ambulatory Visit: Payer: Self-pay

## 2020-05-14 ENCOUNTER — Ambulatory Visit: Payer: 59 | Admitting: Obstetrics and Gynecology

## 2020-05-14 ENCOUNTER — Encounter: Payer: Self-pay | Admitting: Obstetrics and Gynecology

## 2020-05-14 VITALS — BP 132/80 | HR 68 | Resp 16 | Ht 64.0 in | Wt 228.0 lb

## 2020-05-14 DIAGNOSIS — Z01419 Encounter for gynecological examination (general) (routine) without abnormal findings: Secondary | ICD-10-CM | POA: Diagnosis not present

## 2020-05-14 NOTE — Progress Notes (Signed)
59 y.o. G0P0 [name] here for annual exam.    Tried Paxil for hot flashes.  It caused her to have brain swirlies but it did treat hot flashes.  She does not want to return to this.   Herbal options are not helpful.   She took Gabapentin in the past for her knee, and she did not notice a change in her hot flashes.   Received her Covid vaccine.  Declines a booster.  No flu vaccine yet this year.   She and her finace started a healthy eating plan.   PCP: Joneen Boers, MD    Patient's last menstrual period was 08/20/2002.           Sexually active: No.  The current method of family planning is status post hysterectomy.    Exercising: Yes.    walking and weights Smoker:  no  Health Maintenance: Pap:  12/24/15 Neg:Neg HR HPV  12/30/09 Neg History of abnormal Pap:  no MMG:  05/10/19 BIRADS 1 negative/density c -- scheduled 05/22/20 Colonoscopy:  2017 polyps; f/u 2022 BMD:   n/a  Result  n/a TDaP:  03/06/18 Gardasil:   n/a HIV and Hep C: 12/24/15 Neg Screening Labs:  PCP   reports that she has never smoked. She has never used smokeless tobacco. She reports current alcohol use of about 5.0 standard drinks of alcohol per week. She reports that she does not use drugs.  Past Medical History:  Diagnosis Date  . Abnormal uterine bleeding    due to fibroids  . Anxiety   . Arthritis    knee, scheduled for knee replacement 04/2016  . Asthma    exercise induced-no inhalers  . Complication of anesthesia   . Dysmenorrhea    2000  from fibroids  . Family history of adverse reaction to anesthesia 02/26/2016   father had shoulder replacement-admitted back to hospital for related complications  . Fibroid   . Genital warts 1994   Tx  x2  . GERD (gastroesophageal reflux disease)   . Head injury with loss of consciousness ([place]) 1986  . Hyperlipidemia   . PONV (postoperative nausea and vomiting)   . Primary localized osteoarthrosis of the knee, right   . Sleep apnea    has  a cpap-cannot use-makes her sick  . Unilateral post-traumatic osteoarthritis, right knee 04/21/2016    Past Surgical History:  Procedure Laterality Date  . ABDOMINAL HYSTERECTOMY  09/11/2002   ovaries retained. 2o fibroids  . APPENDECTOMY  1982  . BREAST EXCISIONAL BIOPSY Left 09/1995  . BREAST SURGERY  1997   cyst left breast rem. benign  . COLONOSCOPY  06/2015   polyp recheck 5 yrs  . EXCISION VAGINAL CYST N/A 03/02/2016   Procedure: excision of vaginal polyp;  Surgeon: Nunzio Cobbs, MD;  Location: [facility];  Service: Gynecology;  Laterality: N/A;  . KNEE ARTHROSCOPY Right 07/16/2013   Procedure: RIGHT ARTHROSCOPY KNEE, partial lateral menisectomy and chrondromalasia, with microfracture of lateral femeral chondyl, and excision of plica ;  Surgeon: Kerin Salen, MD;  Location: [facility];  Service: Orthopedics;  Laterality: Right;  . KNEE ARTHROSCOPY Right 07/05/2014  . LIPOMA EXCISION    . RECONSTRUCTION OF NOSE  1986  . SHOULDER ARTHROSCOPY WITH ROTATOR CUFF REPAIR AND SUBACROMIAL DECOMPRESSION Left 08/02/2014   Procedure: SHOULDER ARTHROSCOPY WITH ROTATOR CUFF REPAIR AND SUBACROMIAL DECOMPRESSION;  Surgeon: Hessie Dibble, MD;  Location: [facility];  Service: Orthopedics;  Laterality: Left;  .  SHOULDER SURGERY Right 2003   rt  . STERIOD INJECTION Left 08/02/2014   Procedure: LEFT THUMB INJECTION;  Surgeon: Hessie Dibble, MD;  Location: [facility];  Service: Orthopedics;  Laterality: Left;  . TOTAL KNEE ARTHROPLASTY Right 05/03/2016   Procedure: RIGHT TOTAL KNEE ARTHROPLASTY;  Surgeon: Elsie Saas, MD;  Location: [facility];  Service: Orthopedics;  Laterality: Right;  . TOTAL KNEE REVISION Right 08/02/2018   Procedure: Right total knee arthroplasty poly revision;  Surgeon: Gaynelle Arabian, MD;  Location: [facility];  Service: Orthopedics;  Laterality: Right;  37min    Current Outpatient Medications  Medication Sig Dispense Refill  .  Cholecalciferol (VITAMIN D-3 PO) Take by mouth.    . ezetimibe (ZETIA) 10 MG tablet Take 10 mg by mouth daily.     . Multiple Vitamins-Minerals (MULTIVITAMIN WOMEN PO) Take 1 tablet by mouth daily.    Earney Navy Bicarbonate (ZEGERID OTC) 20-1100 MG CAPS capsule Take 1 capsule by mouth daily as needed (for acid reflux).     No current facility-administered medications for this visit.    Family History  Problem Relation Age of Onset  . Diabetes Mother   . Hyperlipidemia Mother   . Hypertension Mother   . Ulcers Mother   . Fibromyalgia Mother   . Sudden death Maternal Grandfather   . Cancer Maternal Grandmother   . Cancer Paternal Grandmother        breast/liver  . Breast cancer Paternal Grandmother   . Hyperlipidemia Father   . Seizures Father   . Hypertension Father   . Dementia Father   . Atrial fibrillation Father   . Hyperlipidemia Brother   . Heart attack Neg Hx     Review of Systems  Constitutional: Negative.   HENT: Negative.   Eyes: Negative.   Respiratory: Negative.   Cardiovascular: Negative.   Gastrointestinal: Negative.   Endocrine: Negative.   Genitourinary: Negative.   Musculoskeletal: Negative.   Skin: Negative.   Allergic/Immunologic: Negative.   Neurological: Negative.   Hematological: Negative.   Psychiatric/Behavioral: Negative.     Exam:   BP 132/80 (BP Location: Right Arm, Patient Position: Sitting, Cuff Size: Normal)   Pulse 68   Resp 16   Ht 5\' 4"  (1.626 m)   Wt 228 lb (103.4 kg)   LMP 08/20/2002   BMI 39.14 kg/m     General appearance: alert, cooperative and appears stated age Head: normocephalic, without obvious abnormality, atraumatic Neck: no adenopathy, supple, symmetrical, trachea midline and thyroid normal to inspection and palpation Lungs: clear to auscultation bilaterally Breasts: normal appearance, no masses or tenderness, No nipple retraction or dimpling, No nipple discharge or bleeding, No axillary adenopathy Heart:  regular rate and rhythm Abdomen: soft, non-tender; no masses, no organomegaly Extremities: extremities normal, atraumatic, no cyanosis or edema Skin: skin color, texture, turgor normal. No rashes or lesions Lymph nodes: cervical, supraclavicular, and axillary nodes normal. Neurologic: grossly normal  Pelvic: External genitalia:  no lesions              No abnormal inguinal nodes palpated.              Urethra:  normal appearing urethra with no masses, tenderness or lesions              Bartholins and Skenes: normal                 Vagina: normal appearing vagina with normal color and discharge, no lesions  Cervix : absent              Pap taken: No. Bimanual Exam:  Uterus:  absent              Adnexa: no mass, fullness, tenderness              Rectal exam: Yes.  .  Confirms.              Anus:  normal sphincter tone, no lesions  Chaperone was present for exam.  Assessment:   Well woman visit with normal exam. Status post TAH. Ovaries remain. Menopausal symptoms.   Plan: Mammogram screening discussed. Self breast awareness reviewed. Pap and HR HPV not indicated.  Guidelines for Calcium, Vitamin D, regular exercise program including cardiovascular and weight bearing exercise. We discussed Effexor, Clonidine Gabapentin.  No Rx given today.  If she wishes to try Clonidine, I recommend she see her PCP.  Follow up annually and prn.

## 2020-05-14 NOTE — Patient Instructions (Signed)
EXERCISE AND DIET:  We recommended that you start or continue a regular exercise program for good health. Regular exercise means any activity that makes your heart beat faster and makes you sweat.  We recommend exercising at least 30 minutes per day at least 3 days a week, preferably 4 or 5.  We also recommend a diet low in fat and sugar.  Inactivity, poor dietary choices and obesity can cause diabetes, heart attack, stroke, and kidney damage, among others.    ALCOHOL AND SMOKING:  Women should limit their alcohol intake to no more than 7 drinks/beers/glasses of wine (combined, not each!) per week. Moderation of alcohol intake to this level decreases your risk of breast cancer and liver damage. And of course, no recreational drugs are part of a healthy lifestyle.  And absolutely no smoking or even second hand smoke. Most people know smoking can cause heart and lung diseases, but did you know it also contributes to weakening of your bones? Aging of your skin?  Yellowing of your teeth and nails?  CALCIUM AND VITAMIN D:  Adequate intake of calcium and Vitamin D are recommended.  The recommendations for exact amounts of these supplements seem to change often, but generally speaking 600 mg of calcium (either carbonate or citrate) and 800 units of Vitamin D per day seems prudent. Certain women may benefit from higher intake of Vitamin D.  If you are among these women, your doctor will have told you during your visit.    PAP SMEARS:  Pap smears, to check for cervical cancer or precancers,  have traditionally been done yearly, although recent scientific advances have shown that most women can have pap smears less often.  However, every woman still should have a physical exam from her gynecologist every year. It will include a breast check, inspection of the vulva and vagina to check for abnormal growths or skin changes, a visual exam of the cervix, and then an exam to evaluate the size and shape of the uterus and  ovaries.  And after 58 years of age, a rectal exam is indicated to check for rectal cancers. We will also provide age appropriate advice regarding health maintenance, like when you should have certain vaccines, screening for sexually transmitted diseases, bone density testing, colonoscopy, mammograms, etc.   MAMMOGRAMS:  All women over 40 years old should have a yearly mammogram. Many facilities now offer a "3D" mammogram, which may cost around $50 extra out of pocket. If possible,  we recommend you accept the option to have the 3D mammogram performed.  It both reduces the number of women who will be called back for extra views which then turn out to be normal, and it is better than the routine mammogram at detecting truly abnormal areas.    COLONOSCOPY:  Colonoscopy to screen for colon cancer is recommended for all women at age 50.  We know, you hate the idea of the prep.  We agree, BUT, having colon cancer and not knowing it is worse!!  Colon cancer so often starts as a polyp that can be seen and removed at colonscopy, which can quite literally save your life!  And if your first colonoscopy is normal and you have no family history of colon cancer, most women don't have to have it again for 10 years.  Once every ten years, you can do something that may end up saving your life, right?  We will be happy to help you get it scheduled when you are ready.    Be sure to check your insurance coverage so you understand how much it will cost.  It may be covered as a preventative service at no cost, but you should check your particular policy.     Clonidine Skin Patches This can be used to treat menopausal hot flashes.  Please inquire with your primary care provider, as it can also treat high blood pressure.    What is this medicine? CLONIDINE (KLOE ni deen) treats high blood pressure. This medicine may be used for other purposes; ask your health care provider or pharmacist if you have questions. COMMON BRAND  NAME(S): Catapres-TTS What should I tell my health care provider before I take this medicine? They need to know if you have any of these conditions:  kidney disease  an unusual or allergic reaction to clonidine, other medicines, foods, dyes, or preservatives  pregnant or trying to get pregnant  breast-feeding How should I use this medicine? This medicine is for external use only. Follow the directions on the prescription label. Apply the patch to an area of the upper arm or part of the body that is clean, dry and hairless. Avoid injured, irritated, calloused, or scarred areas. Use a different site each time to prevent skin irritation. Do not cut or trim the patch. One patch should last for 7 days. Do not use your medicine more often than directed. Do not stop using except on the advice of your doctor or health care professional. You must gradually reduce the dose or you may get a dangerous increase in blood pressure. Talk to your pediatrician regarding the use of this medicine in children. Special care may be needed. Overdosage: If you think you have taken too much of this medicine contact a poison control center or emergency room at once. NOTE: This medicine is only for you. Do not share this medicine with others. What if I miss a dose? Replace each patch on the same day of each week, or if the patch falls off. If you do forget to change the patch for two or three days, check with your doctor or health care professional. What may interact with this medicine? Do not take this medicine with any of the following medications:  MAOIs like Carbex, Eldepryl, Marplan, Nardil, and Parnate This medicine may also interact with the following medications:  barbiturate medicines for inducing sleep or treating seizures like phenobarbital  certain medicines for blood pressure, heart disease, irregular heart beat  certain medicines for depression, anxiety, or psychotic disturbances  prescription pain  medicines This list may not describe all possible interactions. Give your health care provider a list of all the medicines, herbs, non-prescription drugs, or dietary supplements you use. Also tell them if you smoke, drink alcohol, or use illegal drugs. Some items may interact with your medicine. What should I watch for while using this medicine? Visit your doctor or health care professional for regular checks on your progress. Check your heart rate and blood pressure regularly while you are using this medicine. Ask your doctor or health care professional what your heart rate should be and when you should contact him or her. You can shower or bathe with the skin patch in position. If the patch gets loose, cover it with the extra adhesive overlay provided. You may get drowsy or dizzy. Do not drive, use machinery, or do anything that needs mental alertness until you know how this medicine affects you. To avoid dizzy or fainting spells, do not stand or sit up quickly,  especially if you are an older person. Alcohol can make you more drowsy and dizzy. Avoid alcoholic drinks. Your mouth may get dry. Chewing sugarless gum or sucking hard candy, and drinking plenty of water will help. Do not treat yourself for coughs, colds, or pain while you are using this medicine without asking your doctor or health care professional for advice. Some ingredients may increase your blood pressure. If you are going to have surgery tell your doctor or health care professional that you are using this medicine. If you are going to have a magnetic resonance imaging (MRI) procedure, tell your MRI technician if you have this patch on your body. It must be removed before a MRI. What side effects may I notice from receiving this medicine? Side effects that you should report to your doctor or health care professional as soon as possible:  allergic reactions like skin rash, itching or hives, swelling of the face, lips, or  tongue  anxiety, nervousness  chest pain  depression  fast, irregular heartbeat  swelling of feet or legs  unusually weak or tired Side effects that usually do not require medical attention (report to your doctor or health care professional if they continue or are bothersome):  change in sex drive or performance  constipation  headache  skin redness, irritation, or darkening under the patch area This list may not describe all possible side effects. Call your doctor for medical advice about side effects. You may report side effects to FDA at 1-800-FDA-1088. Where should I keep my medicine? Keep out of the reach of children. Store at room temperature between 15 and 30 degrees C (59 to 86 degrees F). Throw away any unused medicine after the expiration date. NOTE: This sheet is a summary. It may not cover all possible information. If you have questions about this medicine, talk to your doctor, pharmacist, or health care provider.  2020 Elsevier/Gold Standard (2019-02-13 28:76:81)

## 2020-05-22 ENCOUNTER — Ambulatory Visit
Admission: RE | Admit: 2020-05-22 | Discharge: 2020-05-22 | Disposition: A | Discharge status: Home / Self Care | Payer: 59 | Source: Ambulatory Visit | Attending: Obstetrics and Gynecology | Admitting: Obstetrics and Gynecology

## 2020-05-22 ENCOUNTER — Other Ambulatory Visit: Payer: Self-pay

## 2020-05-22 DIAGNOSIS — Z1231 Encounter for screening mammogram for malignant neoplasm of breast: Secondary | ICD-10-CM

## 2020-09-19 DIAGNOSIS — U071 COVID-19: Secondary | ICD-10-CM

## 2020-09-19 HISTORY — DX: COVID-19: U07.1

## 2020-10-19 ENCOUNTER — Emergency Department
Admission: EM | Admit: 2020-10-19 | Discharge: 2020-10-19 | Disposition: A | Discharge status: Home / Self Care | Payer: 59 | Source: Home / Self Care

## 2020-10-19 ENCOUNTER — Encounter: Payer: Self-pay | Admitting: Emergency Medicine

## 2020-10-19 ENCOUNTER — Other Ambulatory Visit: Payer: Self-pay

## 2020-10-19 DIAGNOSIS — J069 Acute upper respiratory infection, unspecified: Secondary | ICD-10-CM

## 2020-10-19 DIAGNOSIS — U071 COVID-19: Secondary | ICD-10-CM

## 2020-10-19 DIAGNOSIS — Z20822 Contact with and (suspected) exposure to covid-19: Secondary | ICD-10-CM

## 2020-10-19 LAB — POC SARS CORONAVIRUS 2 AG -  ED: SARS Coronavirus 2 Ag: POSITIVE — AB

## 2020-10-19 MED ORDER — BENZONATATE 100 MG PO CAPS: 100.0000 mg | ORAL_CAPSULE | Freq: Three times a day (TID) | ORAL | 0 refills | Status: DC

## 2020-10-19 MED ORDER — GUAIFENESIN-CODEINE 100-10 MG/5ML PO SYRP: 5.0000 mL | ORAL_SOLUTION | Freq: Three times a day (TID) | ORAL | 0 refills | Status: DC | PRN

## 2020-10-19 NOTE — Discharge Instructions (Addendum)
Rapid COVID test was positive in the office today  Quarantine at home for 10 days from the onset of symptoms  I have sent in [place] for you to use one capsule every 8 hours as needed for cough.  I have sent in cough syrup for you to take. This medication can make you sleepy. Do not drive while taking this medication.  Follow up with this office or with primary care if symptoms are persisting.  Follow up in the ER for high fever, trouble swallowing, trouble breathing, other concerning symptoms.

## 2020-10-19 NOTE — ED Provider Notes (Signed)
[facility]   [id number] 10/19/20 Arrival Time: 1502   CC: COVID symptoms  SUBJECTIVE: History from: patient.  Marissa [name] is a 60 y.o. female who presents with cough, fever, chills for the last 4 days.  States that she just flew back from the [place] yesterday.  States that her husband is still in the [place] because he tested positive for COVID before they left. Has positive history of Covid. Has completed Covid vaccines, has not had booster.  Has not completed flu vaccine this year.  Has taking OTC cough and cold without relief.  Fatigue and cough are worse with activity. Denies previous symptoms in the past. Denies sinus pain, rhinorrhea, sore throat, SOB, wheezing, chest pain, nausea, changes in bowel or bladder habits.    ROS: As per HPI.  All other pertinent ROS negative.     Past Medical History:  Diagnosis Date  . Abnormal uterine bleeding    due to fibroids  . Anxiety   . Arthritis    knee, scheduled for knee replacement 04/2016  . Asthma    exercise induced-no inhalers  . Complication of anesthesia   . Dysmenorrhea    2000  from fibroids  . Family history of adverse reaction to anesthesia 02/26/2016   father had shoulder replacement-admitted back to hospital for related complications  . Fibroid   . Genital warts 1994   Tx  x2  . GERD (gastroesophageal reflux disease)   . Head injury with loss of consciousness ([facility]) 1986  . Hyperlipidemia   . PONV (postoperative nausea and vomiting)   . Primary localized osteoarthrosis of the knee, right   . Sleep apnea    has a cpap-cannot use-makes her sick  . 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 ABDOMINAL HYSTERECTOMY  09/11/2002   ovaries retained. 2o fibroids  . APPENDECTOMY  1982  . BREAST EXCISIONAL BIOPSY Left 09/1995  . BREAST SURGERY  1997   cyst left breast rem. benign  . COLONOSCOPY  06/2015   polyp  recheck 5 yrs  . EXCISION VAGINAL CYST N/A 03/02/2016   Procedure: excision of vaginal polyp;  Surgeon: Nunzio Cobbs, MD;  Location: [facility];  Service: Gynecology;  Laterality: N/A;  . KNEE ARTHROSCOPY Right 07/16/2013   Procedure: RIGHT ARTHROSCOPY KNEE, partial lateral menisectomy and chrondromalasia, with microfracture of lateral femeral chondyl, and excision of plica ;  Surgeon: Kerin Salen, MD;  Location: [facility];  Service: Orthopedics;  Laterality: Right;  . KNEE ARTHROSCOPY Right 07/05/2014  . LIPOMA EXCISION    . RECONSTRUCTION OF NOSE  1986  . SHOULDER ARTHROSCOPY WITH ROTATOR CUFF REPAIR AND SUBACROMIAL DECOMPRESSION Left 08/02/2014   Procedure: SHOULDER ARTHROSCOPY WITH ROTATOR CUFF REPAIR AND SUBACROMIAL DECOMPRESSION;  Surgeon: Hessie Dibble, MD;  Location: [facility];  Service: Orthopedics;  Laterality: Left;  . SHOULDER SURGERY Right 2003   rt  . STERIOD INJECTION Left 08/02/2014   Procedure: LEFT THUMB INJECTION;  Surgeon: Hessie Dibble, MD;  Location: [facility];  Service: Orthopedics;  Laterality: Left;  . TOTAL KNEE ARTHROPLASTY Right 05/03/2016   Procedure: RIGHT TOTAL KNEE ARTHROPLASTY;  Surgeon: Elsie Saas, MD;  Location: [facility];  Service: Orthopedics;  Laterality: Right;  . TOTAL KNEE REVISION Right 08/02/2018   Procedure: Right total knee arthroplasty poly revision;  Surgeon: Gaynelle Arabian, MD;  Location: [facility];  Service: Orthopedics;  Laterality:  Right;  68mn   Allergies  Allergen Reactions  . Statins Itching, Rash and Other (See Comments)    Muscle and joint   No current facility-administered medications on file prior to encounter.   Current Outpatient Medications on File Prior to Encounter  Medication Sig Dispense Refill  . Cholecalciferol (VITAMIN D-3 PO) Take by mouth.    . ezetimibe (ZETIA) 10 MG tablet Take 10 mg by mouth daily.     . Multiple Vitamins-Minerals (MULTIVITAMIN WOMEN PO) Take  1 tablet by mouth daily.    .Earney NavyBicarbonate (ZEGERID) 20-1100 MG CAPS capsule Take 1 capsule by mouth daily as needed (for acid reflux).     Social History   Socioeconomic History  . Marital status: Single    Spouse name: Not on file  . Number of children: Not on file  . Years of education: Not on file  . Highest education level: Not on file  Occupational History  . Not on file  Tobacco Use  . Smoking status: Never Smoker  . Smokeless tobacco: Never Used  Vaping Use  . Vaping Use: Never used  Substance and Sexual Activity  . Alcohol use: Yes    Alcohol/week: 5.0 standard drinks    Types: 3 Glasses of wine, 1 Cans of beer, 1 Shots of liquor per week    Comment: weekly;  . Drug use: No  . Sexual activity: Not Currently    Partners: Male    Birth control/protection: Surgical    Comment: Hysterectomy  Other Topics Concern  . Not on file  Social History Narrative  . Not on file   Social Determinants of Health   Financial Resource Strain: Not on file  Food Insecurity: Not on file  Transportation Needs: Not on file  Physical Activity: Not on file  Stress: Not on file  Social Connections: Not on file  Intimate Partner Violence: Not on file   Family History  Problem Relation Age of Onset  . Diabetes Mother   . Hyperlipidemia Mother   . Hypertension Mother   . Ulcers Mother   . Fibromyalgia Mother   . Sudden death Maternal Grandfather   . Cancer Maternal Grandmother   . Cancer Paternal Grandmother        breast/liver  . Breast cancer Paternal Grandmother   . Hyperlipidemia Father   . Seizures Father   . Hypertension Father   . Dementia Father   . Atrial fibrillation Father   . Hyperlipidemia Brother   . Heart attack Neg Hx     OBJECTIVE:  Vitals:   10/19/20 1514  BP: 132/81  Pulse: 78  Resp: 17  Temp: 99.8 F (37.7 C)  TempSrc: Oral  SpO2: 95%     General appearance: alert; appears fatigued, but nontoxic; speaking in full sentences  and tolerating own secretions HEENT: NCAT; Ears: EACs clear, TMs pearly gray; Eyes: PERRL.  EOM grossly intact. Sinuses: nontender; Nose: nares patent with clear rhinorrhea, Throat: oropharynx erythematous, cobblestoning present, tonsils non erythematous or enlarged, uvula midline  Neck: supple with LAD Lungs: unlabored respirations, symmetrical air entry; cough: moderate; no respiratory distress; CTAB Heart: regular rate and rhythm.  Radial pulses 2+ symmetrical bilaterally Skin: warm and dry Psychological: alert and cooperative; normal mood and affect  LABS:  Results for orders placed or performed during the hospital encounter of 10/19/20 (from the past 24 hour(s))  POC SARS Coronavirus 2 Ag-ED - Nasal Swab (BD Veritor Kit)     Status: Abnormal   Collection Time:  10/19/20  3:29 PM  Result Value Ref Range   SARS Coronavirus 2 Ag Positive (A) Negative     ASSESSMENT & PLAN:  1. COVID   2. Close exposure to COVID-19 virus   3. Viral URI with cough     Meds ordered this encounter  Medications  . benzonatate (TESSALON) 100 MG capsule    Sig: Take 1 capsule (100 mg total) by mouth every 8 (eight) hours.    Dispense:  21 capsule    Refill:  0    Order Specific Question:   Supervising Provider    Answer:   Chase Picket [id number]  . guaiFENesin-codeine (ROBITUSSIN AC) 100-10 MG/5ML syrup    Sig: Take 5 mLs by mouth 3 (three) times daily as needed for cough.    Dispense:  120 mL    Refill:  0    Order Specific Question:   Supervising Provider    Answer:   Chase Picket [id number]   Rapid COVID in office positive Prescribed Tessalon Perles Prescribed Cheratussin Sedation precautions given Continue supportive care at home Patient should remain in quarantine until they have received Covid results.  If negative you may resume normal activities (go back to work/school) while practicing hand hygiene, social distance, and mask wearing.  If positive, patient should remain in  quarantine for at least 5 days from symptom onset AND greater than 72 hours after symptoms resolution (absence of fever without the use of fever-reducing medication and improvement in respiratory symptoms), whichever is longer Get plenty of rest and push fluids Use OTC zyrtec for nasal congestion, runny nose, and/or sore throat Use OTC flonase for nasal congestion and runny nose Use medications daily for symptom relief Use OTC medications like ibuprofen or tylenol as needed fever or pain Call or go to the ED if you have any new or worsening symptoms such as fever, worsening cough, shortness of breath, chest tightness, chest pain, turning blue, changes in mental status.  Reviewed expectations re: course of current medical issues. Questions answered. Outlined signs and symptoms indicating need for more acute intervention. Patient verbalized understanding. After Visit Summary given.         Faustino Congress, NP 10/19/20 1558

## 2020-10-19 NOTE — ED Triage Notes (Signed)
Cough since Thursday - flew back from the [place] yesterday  Fever & chills since Thursday  Fiance tested positive for COVID on Friday  OTC dayquil & nyquil Covid vaccine 02/2020 - no booster

## 2020-10-20 ENCOUNTER — Telehealth: Payer: Self-pay

## 2020-10-20 NOTE — Telephone Encounter (Signed)
Called to discuss with patient about COVID-19 symptoms and the use of one of the available treatments for those with mild to moderate Covid symptoms and at a high risk of hospitalization.  Pt appears to qualify for outpatient treatment due to co-morbid conditions and/or a member of an at-risk group in accordance with the FDA Emergency Use Authorization.    Symptom onset: 10/15/20 Vaccinated: Yes Booster? No Immunocompromised? No Qualifiers: Asthma NIH Criteria:   Unable to reach pt - Left message and call back number [phone].   Marcello Moores

## 2020-10-23 ENCOUNTER — Emergency Department
Admission: EM | Admit: 2020-10-23 | Discharge: 2020-10-23 | Disposition: A | Discharge status: Home / Self Care | Payer: 59 | Source: Home / Self Care

## 2020-10-23 ENCOUNTER — Encounter: Payer: Self-pay | Admitting: Emergency Medicine

## 2020-10-23 ENCOUNTER — Emergency Department (INDEPENDENT_AMBULATORY_CARE_PROVIDER_SITE_OTHER): Payer: 59

## 2020-10-23 ENCOUNTER — Other Ambulatory Visit: Payer: Self-pay

## 2020-10-23 ENCOUNTER — Telehealth: Payer: Self-pay | Admitting: Emergency Medicine

## 2020-10-23 DIAGNOSIS — J3489 Other specified disorders of nose and nasal sinuses: Secondary | ICD-10-CM

## 2020-10-23 DIAGNOSIS — U071 COVID-19: Secondary | ICD-10-CM

## 2020-10-23 DIAGNOSIS — R059 Cough, unspecified: Secondary | ICD-10-CM

## 2020-10-23 MED ORDER — METHYLPREDNISOLONE SODIUM SUCC 125 MG IJ SOLR
125.0000 mg | Freq: Once | INTRAMUSCULAR | Status: AC
  Administered 2020-10-23: 125 mg via INTRAMUSCULAR

## 2020-10-23 MED ORDER — GUAIFENESIN-CODEINE 100-10 MG/5ML PO SYRP: 5.0000 mL | ORAL_SOLUTION | Freq: Three times a day (TID) | ORAL | 0 refills | Status: DC | PRN

## 2020-10-23 MED ORDER — AMOXICILLIN-POT CLAVULANATE 875-125 MG PO TABS: 1.0000 | ORAL_TABLET | Freq: Two times a day (BID) | ORAL | 0 refills | Status: AC

## 2020-10-23 MED ORDER — PREDNISONE 10 MG (21) PO TBPK: ORAL_TABLET | Freq: Every day | ORAL | 0 refills | Status: AC

## 2020-10-23 MED ORDER — ALBUTEROL SULFATE HFA 108 (90 BASE) MCG/ACT IN AERS
2.0000 | INHALATION_SPRAY | Freq: Once | RESPIRATORY_TRACT | Status: AC
  Administered 2020-10-23: 2 via RESPIRATORY_TRACT

## 2020-10-23 NOTE — Discharge Instructions (Signed)
If you are still having sinus pressure and pain over the next 2 days, then fill the Augmentin prescription that I sent you  I have sent in a prednisone taper for you to take for 6 days. 6 tablets on day one, 5 tablets on day two, 4 tablets on day three, 3 tablets on day four, 2 tablets on day five, and 1 tablet on day six.  I have sent in cough syrup for you to take. This medication can make you sleepy. Do not drive while taking this medication.  Your chest x-ray was negative for pneumonia.  You have also received an injection of steroids in the office to help open you up.  Have also sent you home with an albuterol inhaler.  She may use 2 puffs as needed every 4 hours for cough, shortness of breath, wheezing.  Follow up with this office or with primary care if symptoms are persisting.  Follow up in the ER for high fever, trouble swallowing, trouble breathing, other concerning symptoms.

## 2020-10-23 NOTE — ED Triage Notes (Signed)
Return visit - cough is worse  Increased fatigue

## 2020-10-23 NOTE — Telephone Encounter (Signed)
Call from [facility] regarding ongoing cough - pt tested + for COVID on return from a trip to the [place] - see previous note. Patti requests additional meds for cough - provider updated, requests pt return for a chest Xray - pt stated she will et dressed and come in

## 2020-10-23 NOTE — ED Provider Notes (Signed)
[name]    CSN: [id number] Arrival date & time: 10/23/20  1230      History   Chief Complaint Chief Complaint  Patient presents with  . Cough    HPI DARIAN ROSETTA is a 60 y.o. female.   COVID-positive patient that was seen on 10/19/2020 in this office by me.  Was treated with Tessalon Perles and Cheratussin.  States that she has finished the cough syrup.  States that her cough is worse than it has been.  Reports that she also mowed her grass yesterday.  States that she is here to be rechecked.  Also reports headache, fatigue, body aches sinus pressure, headaches, metallic taste in her mouth.  Denies abdominal pain, nausea, vomiting, diarrhea, rash, fever, other symptoms.  ROS per HPI  The history is provided by the patient.    Past Medical History:  Diagnosis Date  . Abnormal uterine bleeding    due to fibroids  . Anxiety   . Arthritis    knee, scheduled for knee replacement 04/2016  . Asthma    exercise induced-no inhalers  . Complication of anesthesia   . COVID-19 09/2020  . Dysmenorrhea    2000  from fibroids  . Family history of adverse reaction to anesthesia 02/26/2016   father had shoulder replacement-admitted back to hospital for related complications  . Fibroid   . Genital warts 1994   Tx  x2  . GERD (gastroesophageal reflux disease)   . Head injury with loss of consciousness ([place]) 1986  . Hyperlipidemia   . PONV (postoperative nausea and vomiting)   . Primary localized osteoarthrosis of the knee, right   . Sleep apnea    has a cpap-cannot use-makes her sick  . Unilateral post-traumatic osteoarthritis, right knee 04/21/2016    Patient Active Problem List   Diagnosis Date Noted  . 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 Post-traumatic osteoarthritis of right knee 05/03/2016  . Unilateral post-traumatic osteoarthritis, right knee 04/21/2016  . Primary localized osteoarthrosis of the knee, right   . Colon polyp 03/23/2013  .  Asthma, mild intermittent 01/25/2013  . Class 3 obesity without serious comorbidity with body mass index (BMI) of 40.0 to 44.9 in adult 01/25/2013  . Cough 08/16/2011  . Right hand pain 01/18/2011  . Obstructive sleep apnea 07/10/2010  . Intrinsic asthma 06/25/2010  . DYSPNEA ON EXERTION 05/14/2010  . Hyperlipidemia 07/14/2007  . GERD 07/14/2007  . Diaphragmatic hernia 04/06/2007    Past Surgical History:  Procedure Laterality Date  . ABDOMINAL HYSTERECTOMY  09/11/2002   ovaries retained. 2o fibroids  . APPENDECTOMY  1982  . BREAST EXCISIONAL BIOPSY Left 09/1995  . BREAST SURGERY  1997   cyst left breast rem. benign  . COLONOSCOPY  06/2015   polyp recheck 5 yrs  . EXCISION VAGINAL CYST N/A 03/02/2016   Procedure: excision of vaginal polyp;  Surgeon: Nunzio Cobbs, MD;  Location: [facility];  Service: Gynecology;  Laterality: N/A;  . KNEE ARTHROSCOPY Right 07/16/2013   Procedure: RIGHT ARTHROSCOPY KNEE, partial lateral menisectomy and chrondromalasia, with microfracture of lateral femeral chondyl, and excision of plica ;  Surgeon: Kerin Salen, MD;  Location: [facility];  Service: Orthopedics;  Laterality: Right;  . KNEE ARTHROSCOPY Right 07/05/2014  . LIPOMA EXCISION    . RECONSTRUCTION OF NOSE  1986  . SHOULDER ARTHROSCOPY WITH ROTATOR CUFF REPAIR AND SUBACROMIAL DECOMPRESSION Left 08/02/2014   Procedure: SHOULDER ARTHROSCOPY WITH ROTATOR CUFF REPAIR  AND SUBACROMIAL DECOMPRESSION;  Surgeon: Hessie Dibble, MD;  Location: [facility];  Service: Orthopedics;  Laterality: Left;  . SHOULDER SURGERY Right 2003   rt  . STERIOD INJECTION Left 08/02/2014   Procedure: LEFT THUMB INJECTION;  Surgeon: Hessie Dibble, MD;  Location: [facility];  Service: Orthopedics;  Laterality: Left;  . TOTAL KNEE ARTHROPLASTY Right 05/03/2016   Procedure: RIGHT TOTAL KNEE ARTHROPLASTY;  Surgeon: Elsie Saas, MD;  Location: [facility];  Service: Orthopedics;   Laterality: Right;  . TOTAL KNEE REVISION Right 08/02/2018   Procedure: Right total knee arthroplasty poly revision;  Surgeon: Gaynelle Arabian, MD;  Location: [facility];  Service: Orthopedics;  Laterality: Right;  13min    OB History    Gravida  0   Para      Term      Preterm      AB      Living        SAB      IAB      Ectopic      Multiple      Live Births               Home Medications    Prior to Admission medications   Medication Sig Start Date End Date Taking? Authorizing Provider  amoxicillin-clavulanate (AUGMENTIN) 875-125 MG tablet Take 1 tablet by mouth 2 (two) times daily for 7 days. 10/23/20 10/30/20 Yes Faustino Congress, NP  guaiFENesin-codeine (ROBITUSSIN AC) 100-10 MG/5ML syrup Take 5 mLs by mouth 3 (three) times daily as needed for cough. 10/23/20  Yes Faustino Congress, NP  predniSONE (STERAPRED UNI-PAK 21 TAB) 10 MG (21) TBPK tablet Take by mouth daily for 6 days. Take 6 tablets on day 1, 5 tablets on day 2, 4 tablets on day 3, 3 tablets on day 4, 2 tablets on day 5, 1 tablet on day 6 10/23/20 10/29/20 Yes Faustino Congress, NP  benzonatate (TESSALON) 100 MG capsule Take 1 capsule (100 mg total) by mouth every 8 (eight) hours. 10/19/20   Faustino Congress, NP  Cholecalciferol (VITAMIN D-3 PO) Take by mouth.    [provider]  ezetimibe (ZETIA) 10 MG tablet Take 10 mg by mouth daily.  02/07/17   [provider]  Multiple Vitamins-Minerals (MULTIVITAMIN WOMEN PO) Take 1 tablet by mouth daily.    [provider]  Omeprazole-Sodium Bicarbonate (ZEGERID) 20-1100 MG CAPS capsule Take 1 capsule by mouth daily as needed (for acid reflux).    [provider]    Family History Family History  Problem Relation Age of Onset  . Diabetes Mother   . Hyperlipidemia Mother   . Hypertension Mother   . Ulcers Mother   . Fibromyalgia Mother   . Sudden death Maternal Grandfather   . Cancer Maternal Grandmother   . Cancer Paternal  Grandmother        breast/liver  . Breast cancer Paternal Grandmother   . Hyperlipidemia Father   . Seizures Father   . Hypertension Father   . Dementia Father   . Atrial fibrillation Father   . Hyperlipidemia Brother   . Heart attack Neg Hx     Social History Social History   Tobacco Use  . Smoking status: Never Smoker  . Smokeless tobacco: Never Used  Vaping Use  . Vaping Use: Never used  Substance Use Topics  . Alcohol use: Yes    Alcohol/week: 5.0 standard drinks    Types: 3 Glasses of wine, 1  Cans of beer, 1 Shots of liquor per week    Comment: weekly;  . Drug use: No     Allergies   Statins   Review of Systems Review of Systems   Physical Exam Triage Vital Signs ED Triage Vitals  Enc Vitals Group     BP      Pulse      Resp      Temp      Temp src      SpO2      Weight      Height      Head Circumference      Peak Flow      Pain Score      Pain Loc      Pain Edu?      Excl. in [place]?    No data found.  Updated Vital Signs BP (!) 155/90 (BP Location: Right Arm)   Pulse 68   Temp 99 F (37.2 C) (Oral)   Resp 18   LMP 08/20/2002   SpO2 95%      Physical Exam Vitals and nursing note reviewed.  Constitutional:      General: She is not in acute distress.    Appearance: Normal appearance. She is well-developed. She is ill-appearing.  HENT:     Head: Normocephalic and atraumatic.     Right Ear: Tympanic membrane, ear canal and external ear normal.     Left Ear: Tympanic membrane, ear canal and external ear normal.     Nose: Nose normal.     Mouth/Throat:     Mouth: Mucous membranes are moist.     Pharynx: Oropharynx is clear.  Eyes:     Extraocular Movements: Extraocular movements intact.     Conjunctiva/sclera: Conjunctivae normal.     Pupils: Pupils are equal, round, and reactive to light.  Cardiovascular:     Rate and Rhythm: Normal rate and regular rhythm.     Heart sounds: Normal heart sounds. No murmur heard.   Pulmonary:      Effort: Pulmonary effort is normal. No respiratory distress.     Breath sounds: No stridor. Wheezing present. No rhonchi or rales.  Chest:     Chest wall: No tenderness.  Abdominal:     General: Bowel sounds are normal. There is no distension.     Palpations: Abdomen is soft. There is no mass.     Tenderness: There is no abdominal tenderness. There is no right CVA tenderness, left CVA tenderness, guarding or rebound.     Hernia: No hernia is present.  Musculoskeletal:        General: Normal range of motion.     Cervical back: Normal range of motion and neck supple.  Lymphadenopathy:     Cervical: Cervical adenopathy present.  Skin:    General: Skin is warm and dry.     Capillary Refill: Capillary refill takes less than 2 seconds.  Neurological:     General: No focal deficit present.     Mental Status: She is alert and oriented to person, place, and time.  Psychiatric:        Mood and Affect: Mood normal.        Behavior: Behavior normal.        Thought Content: Thought content normal.      UC Treatments / Results  Labs (all labs ordered are listed, but only abnormal results are displayed) Labs Reviewed - No data to display  EKG   Radiology DG  Chest 2 View  Result Date: 10/23/2020 CLINICAL DATA:  Cough. Shortness of breath. Covid-19 viral infection. EXAM: CHEST - 2 VIEW COMPARISON:  04/23/2016 FINDINGS: The heart size and mediastinal contours are within normal limits. Both lungs are clear. The visualized skeletal structures are unremarkable. IMPRESSION: No active cardiopulmonary disease. Electronically Signed   By: Marlaine Hind M.D.   On: 10/23/2020 13:16    Procedures Procedures (including critical care time)  Medications Ordered in UC Medications  methylPREDNISolone sodium succinate (SOLU-MEDROL) 125 mg/2 mL injection 125 mg (125 mg Intramuscular Given 10/23/20 1324)  albuterol (VENTOLIN HFA) 108 (90 Base) MCG/ACT inhaler 2 puff (2 puffs Inhalation Given 10/23/20 1323)     Initial Impression / Assessment and Plan / UC Course  I have reviewed the triage vital signs and the nursing notes.  Pertinent labs & imaging results that were available during my care of the patient were reviewed by me and considered in my medical decision making (see chart for details).    COVID-19 Cough Sinus pain  Chest x-ray today is negative for pneumonia Discussed limiting activities even when feeling well to allow your body to heal from COVID Solu-Medrol 125 mg IM given in office today Albuterol inhaler 2 puffs given in office today May use inhaler 2 puffs every 4 hours as needed for cough, shortness of breath, wheezing Prescribe steroid taper Refilled Cheratussin Sedation precautions given Continue to push fluids with electrolytes Continue with ibuprofen and Tylenol as needed Follow up with this office or with primary care if symptoms are persisting.  Follow up in the ER for high fever, trouble swallowing, trouble breathing, other concerning symptoms.   Final Clinical Impressions(s) / UC Diagnoses   Final diagnoses:  COVID-19  Cough  Sinus pain     Discharge Instructions     If you are still having sinus pressure and pain over the next 2 days, then fill the Augmentin prescription that I sent you  I have sent in a prednisone taper for you to take for 6 days. 6 tablets on day one, 5 tablets on day two, 4 tablets on day three, 3 tablets on day four, 2 tablets on day five, and 1 tablet on day six.  I have sent in cough syrup for you to take. This medication can make you sleepy. Do not drive while taking this medication.  Your chest x-ray was negative for pneumonia.  You have also received an injection of steroids in the office to help open you up.  Have also sent you home with an albuterol inhaler.  She may use 2 puffs as needed every 4 hours for cough, shortness of breath, wheezing.  Follow up with this office or with primary care if symptoms are  persisting.  Follow up in the ER for high fever, trouble swallowing, trouble breathing, other concerning symptoms.     ED Prescriptions    Medication Sig Dispense Auth. Provider   predniSONE (STERAPRED UNI-PAK 21 TAB) 10 MG (21) TBPK tablet Take by mouth daily for 6 days. Take 6 tablets on day 1, 5 tablets on day 2, 4 tablets on day 3, 3 tablets on day 4, 2 tablets on day 5, 1 tablet on day 6 21 tablet Faustino Congress, NP   guaiFENesin-codeine (ROBITUSSIN AC) 100-10 MG/5ML syrup Take 5 mLs by mouth 3 (three) times daily as needed for cough. 180 mL Faustino Congress, NP   amoxicillin-clavulanate (AUGMENTIN) 875-125 MG tablet Take 1 tablet by mouth 2 (two) times daily for 7 days.  14 tablet Faustino Congress, NP     PDMP not reviewed this encounter.   Faustino Congress, NP 10/23/20 1329

## 2021-05-20 NOTE — Progress Notes (Addendum)
60 y.o. G0P0 [name] here for annual exam.    Father passed away one month ago. Has good support.   Having hot flashes.  Has tried herbal options that are not helpful.  Paxil helped hot flashes and it had a negative effect on her brain function. "It felt like she was doing 360s in her skull."  PCP:   Dr. Gilman Schmidt  Patient's last menstrual period was 08/20/2002.           Sexually active:  yes.  Finace. The current method of family planning is status post hysterectomy.    Exercising:  none.   Smoker:  no  Health Maintenance: Pap:  12/24/15 Neg:Neg HR HPV, 12/30/09 Neg History of abnormal Pap:  no MMG:  05-22-20 3D/Neg/BiRads1.  Scheduled for January.  Colonoscopy:  2022, polyps per patient BMD:   None    TDaP:  03-06-18 Gardasil:   no HIV: 12-24-15 Neg Hep C: 12-24-15 Neg Screening Labs:  PCP   reports that she has never smoked. She has never used smokeless tobacco. She reports current alcohol use of about 5.0 standard drinks per week. She reports that she does not use drugs.  Past Medical History:  Diagnosis Date   Abnormal uterine bleeding    due to fibroids   Anxiety    Arthritis    knee, scheduled for knee replacement 04/2016   Asthma    exercise induced-no inhalers   Complication of anesthesia    COVID-19 09/2020   Dysmenorrhea    2000  from fibroids   Family history of adverse reaction to anesthesia 02/26/2016   father had shoulder replacement-admitted back to hospital for related complications   Fibroid    Genital warts 1994   Tx  x2   GERD (gastroesophageal reflux disease)    Head injury with loss of consciousness ([place]) 1986   Hyperlipidemia    PONV (postoperative nausea and vomiting)    Primary localized osteoarthrosis of the knee, right    Sleep apnea    has a cpap-cannot use-makes her sick   Unilateral post-traumatic osteoarthritis, right knee 04/21/2016    Past Surgical History:  Procedure Laterality Date   ABDOMINAL HYSTERECTOMY   09/11/2002   ovaries retained. 2o fibroids   APPENDECTOMY  1982   BREAST EXCISIONAL BIOPSY Left 09/1995   BREAST SURGERY  1997   cyst left breast rem. benign   COLONOSCOPY  06/2015   polyp recheck 5 yrs   EXCISION VAGINAL CYST N/A 03/02/2016   Procedure: excision of vaginal polyp;  Surgeon: Nunzio Cobbs, MD;  Location: [facility];  Service: Gynecology;  Laterality: N/A;   KNEE ARTHROSCOPY Right 07/16/2013   Procedure: RIGHT ARTHROSCOPY KNEE, partial lateral menisectomy and chrondromalasia, with microfracture of lateral femeral chondyl, and excision of plica ;  Surgeon: Kerin Salen, MD;  Location: [facility];  Service: Orthopedics;  Laterality: Right;   KNEE ARTHROSCOPY Right 07/05/2014   LIPOMA EXCISION     RECONSTRUCTION OF NOSE  1986   SHOULDER ARTHROSCOPY WITH ROTATOR CUFF REPAIR AND SUBACROMIAL DECOMPRESSION Left 08/02/2014   Procedure: SHOULDER ARTHROSCOPY WITH ROTATOR CUFF REPAIR AND SUBACROMIAL DECOMPRESSION;  Surgeon: Hessie Dibble, MD;  Location: [facility];  Service: Orthopedics;  Laterality: Left;   SHOULDER SURGERY Right 2003   rt   STERIOD INJECTION Left 08/02/2014   Procedure: LEFT THUMB INJECTION;  Surgeon: Hessie Dibble, MD;  Location: [facility];  Service: Orthopedics;  Laterality: Left;  TOTAL KNEE ARTHROPLASTY Right 05/03/2016   Procedure: RIGHT TOTAL KNEE ARTHROPLASTY;  Surgeon: Elsie Saas, MD;  Location: [facility];  Service: Orthopedics;  Laterality: Right;   TOTAL KNEE REVISION Right 08/02/2018   Procedure: Right total knee arthroplasty poly revision;  Surgeon: Gaynelle Arabian, MD;  Location: [facility];  Service: Orthopedics;  Laterality: Right;  3min    Current Outpatient Medications  Medication Sig Dispense Refill   Cholecalciferol (VITAMIN D-3 PO) Take by mouth.     ezetimibe (ZETIA) 10 MG tablet Take 10 mg by mouth daily.      Multiple Vitamins-Minerals (MULTIVITAMIN WOMEN PO) Take 1 tablet by mouth daily.      Omeprazole-Sodium Bicarbonate (ZEGERID) 20-1100 MG CAPS capsule Take 1 capsule by mouth daily as needed (for acid reflux).     No current facility-administered medications for this visit.    Family History  Problem Relation Age of Onset   Diabetes Mother    Hyperlipidemia Mother    Hypertension Mother    Ulcers Mother    Fibromyalgia Mother    Hyperlipidemia Father    Seizures Father    Hypertension Father    Dementia Father    Atrial fibrillation Father    Hyperlipidemia Brother    Cancer Maternal Grandmother    Sudden death Maternal Grandfather    Cancer Paternal Grandmother        breast/liver   Breast cancer Paternal Grandmother    Heart attack Neg Hx     Review of Systems  All other systems reviewed and are negative.  Exam:   BP (!) 144/84 (BP Location: Right Arm)   Pulse 77   Ht 5' 3.78" (1.62 m)   Wt 262 lb (118.8 kg)   LMP 08/20/2002   SpO2 96%   BMI 45.28 kg/m     General appearance: alert, cooperative and appears stated age.  Tearful.  Head: normocephalic, without obvious abnormality, atraumatic Neck: no adenopathy, supple, symmetrical, trachea midline and thyroid normal to inspection and palpation Lungs: clear to auscultation bilaterally Breasts: normal appearance, no masses or tenderness, No nipple retraction or dimpling, No nipple discharge or bleeding, No axillary adenopathy Heart: regular rate and rhythm Abdomen: soft, non-tender; no masses, no organomegaly Extremities: extremities normal, atraumatic, no cyanosis or edema Skin: skin color, texture, turgor normal. No rashes or lesions Lymph nodes: cervical, supraclavicular, and axillary nodes normal. Neurologic: grossly normal  Pelvic: External genitalia:  no lesions              No abnormal inguinal nodes palpated.              Urethra:  normal appearing urethra with no masses, tenderness or lesions              Bartholins and Skenes: normal                 Vagina: normal appearing vagina with  normal color and discharge, no lesions              Cervix: absent              Pap taken: no Bimanual Exam:  Uterus:  absent              Adnexa: no mass, fullness, tenderness              Rectal exam: yes.  Confirms.              Anus:  normal sphincter tone, no lesions  Chaperone was present  for exam:  Lawson Radar., RN  Assessment:   Well woman visit with gynecologic exam. Status post TAH.  Ovaries remain.  Bereavement.  Menopausal symptoms.   Plan: Mammogram screening discussed. Self breast awareness reviewed. Pap and HR HPV as above. Guidelines for Calcium, Vitamin D, regular exercise program including cardiovascular and weight bearing exercise. Bereavement support given.   Consider Hospice counseling.  Start Effexor 37.5 mg daily.  Potential side effects discussed.  FU in 6 weeks for a recheck.  Follow up annually and prn.   After visit summary provided.

## 2021-05-25 ENCOUNTER — Other Ambulatory Visit: Payer: Self-pay | Admitting: Obstetrics and Gynecology

## 2021-05-25 DIAGNOSIS — Z1231 Encounter for screening mammogram for malignant neoplasm of breast: Secondary | ICD-10-CM

## 2021-05-27 ENCOUNTER — Encounter: Payer: Self-pay | Admitting: Obstetrics and Gynecology

## 2021-05-27 ENCOUNTER — Ambulatory Visit (INDEPENDENT_AMBULATORY_CARE_PROVIDER_SITE_OTHER): Payer: 59 | Admitting: Obstetrics and Gynecology

## 2021-05-27 ENCOUNTER — Other Ambulatory Visit: Payer: Self-pay

## 2021-05-27 VITALS — BP 144/84 | HR 77 | Ht 63.78 in | Wt 262.0 lb

## 2021-05-27 DIAGNOSIS — N951 Menopausal and female climacteric states: Secondary | ICD-10-CM

## 2021-05-27 DIAGNOSIS — Z634 Disappearance and death of family member: Secondary | ICD-10-CM

## 2021-05-27 DIAGNOSIS — Z01419 Encounter for gynecological examination (general) (routine) without abnormal findings: Secondary | ICD-10-CM

## 2021-05-27 MED ORDER — VENLAFAXINE HCL ER 37.5 MG PO CP24: 37.5000 mg | ORAL_CAPSULE | Freq: Every day | ORAL | 1 refills | Status: DC

## 2021-05-27 NOTE — Patient Instructions (Addendum)
EXERCISE AND DIET:  We recommended that you start or continue a regular exercise program for good health. Regular exercise means any activity that makes your heart beat faster and makes you sweat.  We recommend exercising at least 30 minutes per day at least 3 days a week, preferably 4 or 5.  We also recommend a diet low in fat and sugar.  Inactivity, poor dietary choices and obesity can cause diabetes, heart attack, stroke, and kidney damage, among others.    ALCOHOL AND SMOKING:  Women should limit their alcohol intake to no more than 7 drinks/beers/glasses of wine (combined, not each!) per week. Moderation of alcohol intake to this level decreases your risk of breast cancer and liver damage. And of course, no recreational drugs are part of a healthy lifestyle.  And absolutely no smoking or even second hand smoke. Most people know smoking can cause heart and lung diseases, but did you know it also contributes to weakening of your bones? Aging of your skin?  Yellowing of your teeth and nails?  CALCIUM AND VITAMIN D:  Adequate intake of calcium and Vitamin D are recommended.  The recommendations for exact amounts of these supplements seem to change often, but generally speaking 600 mg of calcium (either carbonate or citrate) and 800 units of Vitamin D per day seems prudent. Certain women may benefit from higher intake of Vitamin D.  If you are among these women, your doctor will have told you during your visit.    PAP SMEARS:  Pap smears, to check for cervical cancer or precancers,  have traditionally been done yearly, although recent scientific advances have shown that most women can have pap smears less often.  However, every woman still should have a physical exam from her gynecologist every year. It will include a breast check, inspection of the vulva and vagina to check for abnormal growths or skin changes, a visual exam of the cervix, and then an exam to evaluate the size and shape of the uterus and  ovaries.  And after 59 years of age, a rectal exam is indicated to check for rectal cancers. We will also provide age appropriate advice regarding health maintenance, like when you should have certain vaccines, screening for sexually transmitted diseases, bone density testing, colonoscopy, mammograms, etc.   MAMMOGRAMS:  All women over 40 years old should have a yearly mammogram. Many facilities now offer a "3D" mammogram, which may cost around $50 extra out of pocket. If possible,  we recommend you accept the option to have the 3D mammogram performed.  It both reduces the number of women who will be called back for extra views which then turn out to be normal, and it is better than the routine mammogram at detecting truly abnormal areas.    COLONOSCOPY:  Colonoscopy to screen for colon cancer is recommended for all women at age 50.  We know, you hate the idea of the prep.  We agree, BUT, having colon cancer and not knowing it is worse!!  Colon cancer so often starts as a polyp that can be seen and removed at colonscopy, which can quite literally save your life!  And if your first colonoscopy is normal and you have no family history of colon cancer, most women don't have to have it again for 10 years.  Once every ten years, you can do something that may end up saving your life, right?  We will be happy to help you get it scheduled when you are ready.    Be sure to check your insurance coverage so you understand how much it will cost.  It may be covered as a preventative service at no cost, but you should check your particular policy.    Venlafaxine Extended-Release Capsules What is this medication? VENLAFAXINE (VEN la fax een) treats depression and anxiety. It increases the amount of serotonin and norepinephrine in the brain, hormones that help regulate mood. It belongs to a group of medications called SNRIs. This medicine may be used for other purposes; ask your health care provider or pharmacist if you have  questions. COMMON BRAND NAME(S): Effexor XR What should I tell my care team before I take this medication? They need to know if you have any of these conditions: Bleeding disorders Glaucoma Heart disease High blood pressure High cholesterol Kidney disease Liver disease Low levels of sodium in the blood Mania or bipolar disorder Seizures Suicidal thoughts, plans, or attempt; a previous suicide attempt by you or a family Take medications that treat or prevent blood clots Thyroid disease An unusual or allergic reaction to venlafaxine, desvenlafaxine, other medications, foods, dyes, or preservatives Pregnant or trying to get pregnant Breast-feeding How should I use this medication? Take this medication by mouth with a full glass of water. Follow the directions on the prescription label. Do not cut, crush, or chew this medication. Take it with food. If needed, the capsule may be carefully opened and the entire contents sprinkled on a spoonful of cool applesauce. Swallow the applesauce/pellet mixture right away without chewing and follow with a glass of water to ensure complete swallowing of the pellets. Try to take your medication at about the same time each day. Do not take your medication more often than directed. Do not stop taking this medication suddenly except upon the advice of your care team. Stopping this medication too quickly may cause serious side effects or your condition may worsen. A special MedGuide will be given to you by the pharmacist with each prescription and refill. Be sure to read this information carefully each time. Talk to your care team regarding the use of this medication in children. Special care may be needed. Overdosage: If you think you have taken too much of this medicine contact a poison control center or emergency room at once. NOTE: This medicine is only for you. Do not share this medicine with others. What if I miss a dose? If you miss a dose, take it as  soon as you can. If it is almost time for your next dose, take only that dose. Do not take double or extra doses. What may interact with this medication? Do not take this medication with any of the following: Certain medications for fungal infections like fluconazole, itraconazole, ketoconazole, posaconazole, voriconazole Cisapride Desvenlafaxine Dronedarone Duloxetine Levomilnacipran Linezolid MAOIs like Carbex, Eldepryl, Marplan, Nardil, and Parnate Methylene blue (injected into a vein) Milnacipran Pimozide Thioridazine This medication may also interact with the following: Amphetamines Aspirin and aspirin-like medications Certain medications for depression, anxiety, or psychotic disturbances Certain medications for migraine headaches like almotriptan, eletriptan, frovatriptan, naratriptan, rizatriptan, sumatriptan, zolmitriptan Certain medications for sleep Certain medications that treat or prevent blood clots like dalteparin, enoxaparin, warfarin Cimetidine Clozapine Diuretics Fentanyl Furazolidone Indinavir Isoniazid Lithium Metoprolol NSAIDS, medications for pain and inflammation, like ibuprofen or naproxen Other medications that prolong the QT interval (cause an abnormal heart rhythm) like dofetilide, ziprasidone Procarbazine Rasagiline Supplements like St. John's wort, kava kava, valerian Tramadol Tryptophan This list may not describe all possible interactions. Give your health  care provider a list of all the medicines, herbs, non-prescription drugs, or dietary supplements you use. Also tell them if you smoke, drink alcohol, or use illegal drugs. Some items may interact with your medicine. What should I watch for while using this medication? Tell your care team if your symptoms do not get better or if they get worse. Visit your care team for regular checks on your progress. Because it may take several weeks to see the full effects of this medication, it is important to  continue your treatment as prescribed by your care team. Watch for new or worsening thoughts of suicide or depression. This includes sudden changes in mood, behaviors, or thoughts. These changes can happen at any time but are more common in the beginning of treatment or after a change in dose. Call your care team right away if you experience these thoughts or worsening depression. Manic episodes may happen in patients with bipolar disorder who take this medication. Watch for changes in feelings or behaviors such as feeling anxious, nervous, agitated, panicky, irritable, hostile, aggressive, impulsive, severely restless, overly excited and hyperactive, or trouble sleeping. These changes can happen at any time but are more common in the beginning of treatment or after a change in dose. Call your care team right away if you notice any of these symptoms. This medication can cause an increase in blood pressure. Check with your care team for instructions on monitoring your blood pressure while taking this medication. You may get drowsy or dizzy. Do not drive, use machinery, or do anything that needs mental alertness until you know how this medication affects you. Do not stand or sit up quickly, especially if you are an older patient. This reduces the risk of dizzy or fainting spells. Do not drink alcohol while taking this medication. Drinking alcohol may alter the effects of your medication. Serious side effects may occur. Your mouth may get dry. Chewing sugarless gum, sucking hard candy and drinking plenty of water will help. Contact your care team if the problem does not go away or is severe. What side effects may I notice from receiving this medication? Side effects that you should report to your care team as soon as possible: Allergic reactions--skin rash, itching, hives, swelling of the face, lips, tongue, or throat Bleeding--bloody or black, tar-like stools, red or dark brown urine, vomiting blood or brown  material that looks like coffee grounds, small, red or purple spots on skin, unusual bleeding or bruising Heart rhythm changes--fast or irregular heartbeat, dizziness, feeling faint or lightheaded, chest pain, trouble breathing Increase in blood pressure Loss of appetite with weight loss Low sodium level--muscle weakness, fatigue, dizziness, headache, confusion Serotonin syndrome--irritability, confusion, fast or irregular heartbeat, muscle stiffness, twitching muscles, sweating, high fever, seizures, chills, vomiting, diarrhea Sudden eye pain or change in vision such as blurry vision, seeing halos around lights, vision loss Thoughts of suicide or self-harm, worsening mood, feelings of depression Side effects that usually do not require medical attention (report to your care team if they continue or are bothersome): Anxiety, nervousness Change in sex drive or performance Dizziness Dry mouth Excessive sweating Nausea Tremors or shaking Trouble sleeping This list may not describe all possible side effects. Call your doctor for medical advice about side effects. You may report side effects to FDA at 1-800-FDA-1088. Where should I keep my medication? Keep out of the reach of children and pets. Store at a controlled temperature between 20 and 25 degrees C (68 degrees and 77 degrees  F), in a dry place. Throw away any unused medication after the expiration date. NOTE: This sheet is a summary. It may not cover all possible information. If you have questions about this medicine, talk to your doctor, pharmacist, or health care provider.  2022 Elsevier/Gold Standard (2021-01-05 00:00:00)

## 2021-06-25 ENCOUNTER — Ambulatory Visit
Admission: RE | Admit: 2021-06-25 | Discharge: 2021-06-25 | Disposition: A | Discharge status: Home / Self Care | Payer: 59 | Source: Ambulatory Visit | Attending: Obstetrics and Gynecology | Admitting: Obstetrics and Gynecology

## 2021-06-25 DIAGNOSIS — Z1231 Encounter for screening mammogram for malignant neoplasm of breast: Secondary | ICD-10-CM

## 2021-07-08 ENCOUNTER — Other Ambulatory Visit: Payer: Self-pay

## 2021-07-08 ENCOUNTER — Ambulatory Visit: Payer: 59 | Admitting: Obstetrics and Gynecology

## 2021-07-08 ENCOUNTER — Encounter: Payer: Self-pay | Admitting: Obstetrics and Gynecology

## 2021-07-08 VITALS — BP 130/84 | HR 68 | Ht 64.0 in | Wt 262.0 lb

## 2021-07-08 DIAGNOSIS — Z5181 Encounter for therapeutic drug level monitoring: Secondary | ICD-10-CM

## 2021-07-08 DIAGNOSIS — N951 Menopausal and female climacteric states: Secondary | ICD-10-CM | POA: Diagnosis not present

## 2021-07-08 NOTE — Progress Notes (Signed)
GYNECOLOGY  VISIT   HPI: 61 y.o.   Single  Caucasian  female   G0P0 with Patient's last menstrual period was 08/20/2002.   here for medication follow up. Effexor not helping with hot flashes. Dose is 37.5 mg.  Constant HA, insomnia is worse, and no reduction in the hot flashes.  She did not take the medication this am.  Tylenol helps the headache.  Patient did not tolerate Paxil in the past.   She used Neurontin when she had nerve pain.  Had multiple knee surgeries.   She is asking about bio-identical hormones injected as a pellet.  Will start a bereavement class through her church.   Has lost weight with Optivia previously and did regain weight.   GYNECOLOGIC HISTORY: Patient's last menstrual period was 08/20/2002. 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   Post-traumatic osteoarthritis of right knee 05/03/2016   Unilateral post-traumatic osteoarthritis, right knee 04/21/2016   Primary localized osteoarthrosis of the knee, right    Colon polyp 03/23/2013   Asthma, mild intermittent 01/25/2013   Class 3 obesity without serious comorbidity with body mass index (BMI) of 40.0 to 44.9 in adult 01/25/2013   Cough 08/16/2011   Right hand pain 01/18/2011   Obstructive sleep apnea 07/10/2010   Intrinsic asthma 06/25/2010   DYSPNEA ON EXERTION 05/14/2010   Hyperlipidemia 07/14/2007   GERD 07/14/2007   Diaphragmatic hernia 04/06/2007    Past Medical History:  Diagnosis Date   Abnormal uterine bleeding    due to fibroids   Anxiety    Arthritis    knee, scheduled for knee replacement  04/2016   Asthma    exercise induced-no inhalers   Complication of anesthesia    COVID-19 09/2020   Dysmenorrhea    2000  from fibroids   Family history of adverse reaction to anesthesia 02/26/2016   father had shoulder replacement-admitted back to hospital for related complications   Fibroid    Genital warts 1994   Tx  x2   GERD (gastroesophageal reflux disease)    Head injury with loss of consciousness ([place]) 1986   Hyperlipidemia    PONV (postoperative nausea and vomiting)    Primary localized osteoarthrosis of the knee, right    Sleep apnea    has a cpap-cannot use-makes her sick   Unilateral post-traumatic osteoarthritis, right knee 04/21/2016    Past Surgical History:  Procedure Laterality Date   ABDOMINAL HYSTERECTOMY  09/11/2002   ovaries retained. 2o fibroids   APPENDECTOMY  1982   BREAST EXCISIONAL BIOPSY Left 09/1995   BREAST SURGERY  1997   cyst left breast rem. benign   COLONOSCOPY  06/2015   polyp recheck 5 yrs   EXCISION VAGINAL CYST N/A 03/02/2016   Procedure: excision of vaginal  polyp;  Surgeon: Nunzio Cobbs, MD;  Location: [facility];  Service: Gynecology;  Laterality: N/A;   KNEE ARTHROSCOPY Right 07/16/2013   Procedure: RIGHT ARTHROSCOPY KNEE, partial lateral menisectomy and chrondromalasia, with microfracture of lateral femeral chondyl, and excision of plica ;  Surgeon: Kerin Salen, MD;  Location: [facility];  Service: Orthopedics;  Laterality: Right;   KNEE ARTHROSCOPY Right 07/05/2014   LIPOMA EXCISION     RECONSTRUCTION OF NOSE  1986   SHOULDER ARTHROSCOPY WITH ROTATOR CUFF REPAIR AND SUBACROMIAL DECOMPRESSION Left 08/02/2014   Procedure: SHOULDER ARTHROSCOPY WITH ROTATOR CUFF REPAIR AND SUBACROMIAL DECOMPRESSION;  Surgeon: Hessie Dibble, MD;  Location: [facility];  Service: Orthopedics;  Laterality: Left;   SHOULDER SURGERY Right 2003   rt   STERIOD INJECTION Left 08/02/2014   Procedure: LEFT THUMB INJECTION;   Surgeon: Hessie Dibble, MD;  Location: [facility];  Service: Orthopedics;  Laterality: Left;   TOTAL KNEE ARTHROPLASTY Right 05/03/2016   Procedure: RIGHT TOTAL KNEE ARTHROPLASTY;  Surgeon: Elsie Saas, MD;  Location: [facility];  Service: Orthopedics;  Laterality: Right;   TOTAL KNEE REVISION Right 08/02/2018   Procedure: Right total knee arthroplasty poly revision;  Surgeon: Gaynelle Arabian, MD;  Location: [facility];  Service: Orthopedics;  Laterality: Right;  60min    Current Outpatient Medications  Medication Sig Dispense Refill   Cholecalciferol (VITAMIN D-3 PO) Take by mouth.     ezetimibe (ZETIA) 10 MG tablet Take 10 mg by mouth daily.      Multiple Vitamins-Minerals (MULTIVITAMIN WOMEN PO) Take 1 tablet by mouth daily.     Omeprazole-Sodium Bicarbonate (ZEGERID) 20-1100 MG CAPS capsule Take 1 capsule by mouth daily as needed (for acid reflux).     venlafaxine XR (EFFEXOR XR) 37.5 MG 24 hr capsule Take 1 capsule (37.5 mg total) by mouth daily. 30 capsule 1   No current facility-administered medications for this visit.     ALLERGIES: Statins  Family History  Problem Relation Age of Onset   Diabetes Mother    Hyperlipidemia Mother    Hypertension Mother    Ulcers Mother    Fibromyalgia Mother    Hyperlipidemia Father    Seizures Father    Hypertension Father    Dementia Father    Atrial fibrillation Father    Hyperlipidemia Brother    Cancer Maternal Grandmother    Sudden death Maternal Grandfather    Cancer Paternal Grandmother        breast/liver   Breast cancer Paternal Grandmother    Heart attack Neg Hx     Social History   Socioeconomic History   Marital status: Single    Spouse name: Not on file   Number of children: Not on file   Years of education: Not on file   Highest education level: Not on file  Occupational History   Not on file  Tobacco Use   Smoking status: Never   Smokeless tobacco: Never  Vaping Use   Vaping Use: Never used   Substance and Sexual Activity   Alcohol use: Yes    Alcohol/week: 5.0 standard drinks    Types: 3 Glasses of wine, 1 Cans of beer, 1 Shots of liquor per week    Comment: weekly;   Drug use: No   Sexual activity: Not Currently    Partners: Male    Birth control/protection: Surgical    Comment: Hysterectomy  Other Topics Concern   Not on file  Social History Narrative   Not on file   Social Determinants of Health   Financial Resource Strain: Not on file  Food Insecurity: Not on file  Transportation Needs: Not on file  Physical Activity: Not on file  Stress: Not on file  Social Connections: Not on file  Intimate Partner Violence: Not on file    Review of Systems  All other systems reviewed and are negative.  PHYSICAL EXAMINATION:    BP 130/84    Pulse 68    Ht 5\' 4"  (1.626 m)    Wt 262 lb (118.8 kg)    LMP 08/20/2002    SpO2 98%    BMI 44.97 kg/m     General appearance: alert, cooperative and appears stated age   ASSESSMENT  Menopausal symptoms.   Effexor not helpful and having side effects.  Bereavement.   PLAN  Will wean off Effexor XR 37.5 mg.  She will take every other day for 2 weeks and then stop.  I reminded her that Effexor also treats depression and anxiety.  If she experiences an increase in these symptoms following discontinuation of the Effexor, I encouraged her to contact me or her PCP to discuss potential treatment with another medication.  We discussed gabapentin or clonidine patch.  She prefers not to take prescription medication.  I do not recommend HRT due to increased risks of breast cancer, stroke, MI, DVT, and PE. Patient considering Optivia for weight loss.  Fu prn.   An After Visit Summary was printed and given to the patient.  23 min  total time was spent for this patient encounter, including preparation, face-to-face counseling with the patient, coordination of care, and documentation of the encounter.

## 2021-07-24 ENCOUNTER — Other Ambulatory Visit: Payer: Self-pay | Admitting: Obstetrics and Gynecology

## 2021-07-24 NOTE — Telephone Encounter (Signed)
Per note 06/2021 "Will wean off Effexor XR 37.5 mg.  She will take every other day for 2 weeks and then stop. "   I called patient and she is not taking Rx, nor did she request this. Rx denied.

## 2021-08-06 ENCOUNTER — Other Ambulatory Visit: Payer: Self-pay

## 2021-08-06 ENCOUNTER — Emergency Department (INDEPENDENT_AMBULATORY_CARE_PROVIDER_SITE_OTHER): Payer: 59

## 2021-08-06 ENCOUNTER — Emergency Department
Admission: EM | Admit: 2021-08-06 | Discharge: 2021-08-06 | Disposition: A | Discharge status: Home / Self Care | Payer: 59 | Source: Home / Self Care

## 2021-08-06 DIAGNOSIS — R059 Cough, unspecified: Secondary | ICD-10-CM

## 2021-08-06 DIAGNOSIS — J309 Allergic rhinitis, unspecified: Secondary | ICD-10-CM

## 2021-08-06 DIAGNOSIS — J0100 Acute maxillary sinusitis, unspecified: Secondary | ICD-10-CM

## 2021-08-06 LAB — POC SARS CORONAVIRUS 2 AG -  ED: SARS Coronavirus 2 Ag: NEGATIVE

## 2021-08-06 LAB — POCT INFLUENZA A/B
Influenza A, POC: NEGATIVE
Influenza B, POC: NEGATIVE

## 2021-08-06 MED ORDER — FEXOFENADINE HCL 180 MG PO TABS: 180.0000 mg | ORAL_TABLET | Freq: Every day | ORAL | 0 refills | Status: DC

## 2021-08-06 MED ORDER — AMOXICILLIN-POT CLAVULANATE 875-125 MG PO TABS: 1.0000 | ORAL_TABLET | Freq: Two times a day (BID) | ORAL | 0 refills | Status: DC

## 2021-08-06 MED ORDER — PREDNISONE 20 MG PO TABS: ORAL_TABLET | ORAL | 0 refills | Status: DC

## 2021-08-06 MED ORDER — BENZONATATE 200 MG PO CAPS: 200.0000 mg | ORAL_CAPSULE | Freq: Three times a day (TID) | ORAL | 0 refills | Status: AC | PRN

## 2021-08-06 NOTE — Discharge Instructions (Addendum)
Advised patient to discontinue OTC NyQuil cold and flu.  Advised patient that chest x-ray was clear with no active cardiopulmonary process.  Advised patient to take medication as directed with food to completion.  Advised patient to take prednisone and Allegra with first dose of Augmentin for the next 5 of 7 days.  Advised may use Allegra afterwards as needed for concurrent postnasal drainage/drip.  Advised may use Tessalon Perles daily or as needed for cough.  Encouraged patient to increase daily water intake while taking these medications.

## 2021-08-06 NOTE — ED Triage Notes (Signed)
Pt presents to Urgent Care with c/o nasal congestion, headache, and cough x 2 days. Also intermittent sob. Reports frequent hx of bronchitis. Negative home COVID test yesterday.

## 2021-08-06 NOTE — ED Provider Notes (Signed)
[name]    CSN: [id number] Arrival date & time: 08/06/21  1037      History   Chief Complaint Chief Complaint  Patient presents with   Cough   Nasal Congestion    HPI Marissa [name] is a 61 y.o. female.   HPI 61 year old female presents with nasal congestion, headache, cough and intermittent shortness of breath for 2 days.  Reports history of frequent bronchitis.  Reports negative home COVID-19 test yesterday.  PMH significant for morbid obesity, asthma and is followed by pulmonology for obstructive sleep apnea and dyspnea unspecified.  Past Medical History:  Diagnosis Date   Abnormal uterine bleeding    due to fibroids   Anxiety    Arthritis    knee, scheduled for knee replacement 04/2016   Asthma    exercise induced-no inhalers   Complication of anesthesia    COVID-19 09/2020   Dysmenorrhea    2000  from fibroids   Family history of adverse reaction to anesthesia 02/26/2016   father had shoulder replacement-admitted back to hospital for related complications   Fibroid    Genital warts 1994   Tx  x2   GERD (gastroesophageal reflux disease)    Head injury with loss of consciousness ([place]) 1986   Hyperlipidemia    PONV (postoperative nausea and vomiting)    Primary localized osteoarthrosis of the knee, right    Sleep apnea    has a cpap-cannot use-makes her sick   Unilateral post-traumatic osteoarthritis, right knee 04/21/2016    Patient Active Problem List   Diagnosis Date Noted   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   Post-traumatic osteoarthritis of right knee 05/03/2016   Unilateral post-traumatic osteoarthritis, right knee 04/21/2016   Primary localized osteoarthrosis of the knee, right    Colon polyp 03/23/2013   Asthma, mild intermittent 01/25/2013   Class 3 obesity without serious comorbidity with body mass index (BMI) of 40.0 to 44.9 in adult 01/25/2013   Cough 08/16/2011   Right hand pain 01/18/2011   Obstructive sleep  apnea 07/10/2010   Intrinsic asthma 06/25/2010   DYSPNEA ON EXERTION 05/14/2010   Hyperlipidemia 07/14/2007   GERD 07/14/2007   Diaphragmatic hernia 04/06/2007    Past Surgical History:  Procedure Laterality Date   ABDOMINAL HYSTERECTOMY  09/11/2002   ovaries retained. 2o fibroids   APPENDECTOMY  1982   BREAST EXCISIONAL BIOPSY Left 09/1995   BREAST SURGERY  1997   cyst left breast rem. benign   COLONOSCOPY  06/2015   polyp recheck 5 yrs   EXCISION VAGINAL CYST N/A 03/02/2016   Procedure: excision of vaginal polyp;  Surgeon: Nunzio Cobbs, MD;  Location: [facility];  Service: Gynecology;  Laterality: N/A;   KNEE ARTHROSCOPY Right 07/16/2013   Procedure: RIGHT ARTHROSCOPY KNEE, partial lateral menisectomy and chrondromalasia, with microfracture of lateral femeral chondyl, and excision of plica ;  Surgeon: Kerin Salen, MD;  Location: [facility];  Service: Orthopedics;  Laterality: Right;   KNEE ARTHROSCOPY Right 07/05/2014   LIPOMA EXCISION     RECONSTRUCTION OF NOSE  1986   SHOULDER ARTHROSCOPY WITH ROTATOR CUFF REPAIR AND SUBACROMIAL DECOMPRESSION Left 08/02/2014   Procedure: SHOULDER ARTHROSCOPY WITH ROTATOR CUFF REPAIR AND SUBACROMIAL DECOMPRESSION;  Surgeon: Hessie Dibble, MD;  Location: [facility];  Service: Orthopedics;  Laterality: Left;   SHOULDER SURGERY Right 2003   rt   STERIOD INJECTION Left 08/02/2014   Procedure: LEFT THUMB INJECTION;  Surgeon: Collier Salina  Autumn Patty, MD;  Location: [facility];  Service: Orthopedics;  Laterality: Left;   TOTAL KNEE ARTHROPLASTY Right 05/03/2016   Procedure: RIGHT TOTAL KNEE ARTHROPLASTY;  Surgeon: Elsie Saas, MD;  Location: [facility];  Service: Orthopedics;  Laterality: Right;   TOTAL KNEE REVISION Right 08/02/2018   Procedure: Right total knee arthroplasty poly revision;  Surgeon: Gaynelle Arabian, MD;  Location: [facility];  Service: Orthopedics;  Laterality: Right;  93min    OB History      Gravida  0   Para      Term      Preterm      AB      Living         SAB      IAB      Ectopic      Multiple      Live Births               Home Medications    Prior to Admission medications   Medication Sig Start Date End Date Taking? Authorizing Provider  amoxicillin-clavulanate (AUGMENTIN) 875-125 MG tablet Take 1 tablet by mouth every 12 (twelve) hours. 08/06/21  Yes Eliezer Lofts, FNP  benzonatate (TESSALON) 200 MG capsule Take 1 capsule (200 mg total) by mouth 3 (three) times daily as needed for up to 7 days for cough. 08/06/21 08/13/21 Yes Eliezer Lofts, FNP  DM-Doxylamine-Acetaminophen (NYQUIL COLD & FLU PO) Take by mouth.   Yes [provider]  fexofenadine (ALLEGRA ALLERGY) 180 MG tablet Take 1 tablet (180 mg total) by mouth daily for 15 days. 08/06/21 08/21/21 Yes Eliezer Lofts, FNP  predniSONE (DELTASONE) 20 MG tablet Take 3 tabs PO daily x 5 days. 08/06/21  Yes Eliezer Lofts, FNP  Cholecalciferol (VITAMIN D-3 PO) Take by mouth.    [provider]  ezetimibe (ZETIA) 10 MG tablet Take 10 mg by mouth daily.  02/07/17   [provider]  Multiple Vitamins-Minerals (MULTIVITAMIN WOMEN PO) Take 1 tablet by mouth daily.    [provider]  Omeprazole-Sodium Bicarbonate (ZEGERID) 20-1100 MG CAPS capsule Take 1 capsule by mouth daily as needed (for acid reflux).    [provider]  venlafaxine XR (EFFEXOR XR) 37.5 MG 24 hr capsule Take 1 capsule (37.5 mg total) by mouth daily. 05/27/21   Nunzio Cobbs, MD    Family History Family History  Problem Relation Age of Onset   Diabetes Mother    Hyperlipidemia Mother    Hypertension Mother    Ulcers Mother    Fibromyalgia Mother    Hyperlipidemia Father    Seizures Father    Hypertension Father    Dementia Father    Atrial fibrillation Father    Hyperlipidemia Brother    Cancer Maternal Grandmother    Sudden death Maternal Grandfather    Cancer Paternal  Grandmother        breast/liver   Breast cancer Paternal Grandmother    Heart attack Neg Hx     Social History Social History   Tobacco Use   Smoking status: Never   Smokeless tobacco: Never  Vaping Use   Vaping Use: Never used  Substance Use Topics   Alcohol use: Yes    Alcohol/week: 4.0 standard drinks    Types: 4 Standard drinks or equivalent per week    Comment: weekly   Drug use: No     Allergies   Statins   Review of Systems Review of Systems  HENT:  Positive for congestion.   Respiratory:  Positive for cough and shortness of breath.   All other systems reviewed and are negative.   Physical Exam Triage Vital Signs ED Triage Vitals  Enc Vitals Group     BP 08/06/21 1116 123/81     Pulse Rate 08/06/21 1116 90     Resp 08/06/21 1116 (!) 28     Temp 08/06/21 1116 98.4 F (36.9 C)     Temp Source 08/06/21 1116 Oral     SpO2 08/06/21 1116 (!) 89 %     Weight 08/06/21 1110 250 lb (113.4 kg)     Height 08/06/21 1110 5\' 4"  (1.626 m)     Head Circumference --      Peak Flow --      Pain Score 08/06/21 1109 2     Pain Loc --      Pain Edu? --      Excl. in [place]? --    No data found.  Updated Vital Signs BP 123/81 (BP Location: Right Arm)    Pulse 90    Temp 98.4 F (36.9 C) (Oral)    Resp (!) 28    Ht 5\' 4"  (1.626 m)    Wt 250 lb (113.4 kg)    LMP 08/20/2002    SpO2 (!) 89%    BMI 42.91 kg/m   Physical Exam Constitutional:      General: She is not in acute distress.    Appearance: She is obese. She is ill-appearing.  HENT:     Head: Normocephalic.     Right Ear: Tympanic membrane and external ear normal.     Left Ear: Tympanic membrane and external ear normal.     Ears:     Comments: Moderate eustachian tube dysfunction noted bilaterally    Mouth/Throat:     Mouth: Mucous membranes are moist.     Pharynx: Oropharynx is clear.     Comments: Significant amount of clear drainage of posterior oropharynx noted Eyes:     Extraocular Movements:  Extraocular movements intact.     Conjunctiva/sclera: Conjunctivae normal.     Pupils: Pupils are equal, round, and reactive to light.  Cardiovascular:     Rate and Rhythm: Normal rate and regular rhythm.     Pulses: Normal pulses.     Heart sounds: Normal heart sounds.  Pulmonary:     Breath sounds: Wheezing and rhonchi present.     Comments: Diffuse scattered rhonchi and wheezing noted throughout, diminished breath sounds bibasilarly, infrequent nonproductive to productive cough noted Musculoskeletal:     Cervical back: Normal range of motion and neck supple. No tenderness.  Lymphadenopathy:     Cervical: No cervical adenopathy.  Skin:    General: Skin is warm and dry.  Neurological:     General: No focal deficit present.     Mental Status: She is alert and oriented to person, place, and time.     UC Treatments / Results  Labs (all labs ordered are listed, but only abnormal results are displayed) Labs Reviewed  POCT INFLUENZA A/B  POC SARS CORONAVIRUS 2 AG -  ED    EKG   Radiology DG Chest 2 View  Result Date: 08/06/2021 CLINICAL DATA:  Cough. EXAM: CHEST - 2 VIEW COMPARISON:  Oct 23, 2020. FINDINGS: The heart size and mediastinal contours are within normal limits. Both lungs are clear. The visualized skeletal structures are unremarkable. IMPRESSION: No active cardiopulmonary disease. Electronically Signed   By: Jeneen Rinks  Murlean Caller M.D.   On: 08/06/2021 12:05    Procedures Procedures (including critical care time)  Medications Ordered in UC Medications - No data to display  Initial Impression / Assessment and Plan / UC Course  I have reviewed the triage vital signs and the nursing notes.  Pertinent labs & imaging results that were available during my care of the patient were reviewed by me and considered in my medical decision making (see chart for details).     MDM: 1.  Cough-CXR revealed no acute cardiopulmonary process, Rx'd prednisone and Tessalon Perles; 2.  Acute  maxillary sinusitis-Rx'd Augmentin; 3.  Allergic rhinitis-Rx'd Allegra. Advised patient to discontinue OTC NyQuil cold and flu.  Advised patient that chest x-ray was clear with no active cardiopulmonary process.  Advised patient to take medication as directed with food to completion.  Advised patient to take prednisone and Allegra with first dose of Augmentin for the next 5 of 7 days.  Advised may use Allegra afterwards as needed for concurrent postnasal drainage/drip.  Advised may use Tessalon Perles daily or as needed for cough.  Encouraged patient to increase daily water intake while taking these medications.  Patient discharged home, hemodynamically stable. Final Clinical Impressions(s) / UC Diagnoses   Final diagnoses:  Cough, unspecified type  Acute maxillary sinusitis, recurrence not specified  Allergic rhinitis, unspecified seasonality, unspecified trigger     Discharge Instructions      Advised patient to discontinue OTC NyQuil cold and flu.  Advised patient that chest x-ray was clear with no active cardiopulmonary process.  Advised patient to take medication as directed with food to completion.  Advised patient to take prednisone and Allegra with first dose of Augmentin for the next 5 of 7 days.  Advised may use Allegra afterwards as needed for concurrent postnasal drainage/drip.  Advised may use Tessalon Perles daily or as needed for cough.  Encouraged patient to increase daily water intake while taking these medications.     ED Prescriptions     Medication Sig Dispense Auth. Provider   amoxicillin-clavulanate (AUGMENTIN) 875-125 MG tablet Take 1 tablet by mouth every 12 (twelve) hours. 14 tablet Eliezer Lofts, FNP   predniSONE (DELTASONE) 20 MG tablet Take 3 tabs PO daily x 5 days. 15 tablet Eliezer Lofts, FNP   fexofenadine [facility] ALLERGY) 180 MG tablet Take 1 tablet (180 mg total) by mouth daily for 15 days. 15 tablet Eliezer Lofts, FNP   benzonatate (TESSALON) 200 MG  capsule Take 1 capsule (200 mg total) by mouth 3 (three) times daily as needed for up to 7 days for cough. 40 capsule Eliezer Lofts, FNP      PDMP not reviewed this encounter.   Eliezer Lofts, [place] 08/06/21 1220

## 2021-08-06 NOTE — ED Notes (Addendum)
Pt's O2 sat is 89-90%, RR 28 per minute. Kathi Ludwig, FNP notified.

## 2021-08-10 MED ORDER — PROMETHAZINE-DM 6.25-15 MG/5ML PO SYRP: 5.0000 mL | ORAL_SOLUTION | Freq: Two times a day (BID) | ORAL | 0 refills | Status: DC | PRN

## 2021-08-10 NOTE — Telephone Encounter (Signed)
Patient called in requesting cough medication as cough was not improving.  Advised patient that we would send and Promethazine DM for cough.

## 2022-05-25 NOTE — Progress Notes (Signed)
61 y.o. G0P0 [name] here for annual exam.    Off Effexor for menopausal symptoms.   She is having hot flashes and night sweats.   PCP:   Joneen Boers, MD  Patient's last menstrual period was 08/20/2002.           Sexually active: No.  The current method of family planning is status post hysterectomy.    Exercising: No.   Smoker:  no  Health Maintenance: Pap:   12/24/15 Neg:Neg HR HPV, 12/30/09 Neg  History of abnormal Pap:  no MMG:  06/25/21, Breast Density Category C, BI-RADS CATEGORY 1 Negative. Has appt for Feb.   Colonoscopy:  2022, polyps per pt.  [facility].  States she is due in 1 - 2 years.  BMD:   n/a  Result  n/a TDaP:  03/06/18 Gardasil:   no HIV: 12/24/15 neg Hep C: 12/24/15 neg Screening Labs:  PCP   reports that she has never smoked. She has never used smokeless tobacco. She reports current alcohol use of about 4.0 standard drinks of alcohol per week. She reports that she does not use drugs.  Past Medical History:  Diagnosis Date   Abnormal uterine bleeding    due to fibroids   Anxiety    Arthritis    knee, scheduled for knee replacement 04/2016   Asthma    exercise induced-no inhalers   Complication of anesthesia    COVID-19 09/2020   Dysmenorrhea    2000  from fibroids   Family history of adverse reaction to anesthesia 02/26/2016   father had shoulder replacement-admitted back to hospital for related complications   Fibroid    Genital warts 1994   Tx  x2   GERD (gastroesophageal reflux disease)    Head injury with loss of consciousness ([place]) 1986   Hyperlipidemia    PONV (postoperative nausea and vomiting)    Primary localized osteoarthrosis of the knee, right    Sleep apnea    has a cpap-cannot use-makes her sick   Unilateral post-traumatic osteoarthritis, right knee 04/21/2016    Past Surgical History:  Procedure Laterality Date   ABDOMINAL HYSTERECTOMY  09/11/2002   ovaries retained. 2o fibroids   APPENDECTOMY  1982   BREAST  EXCISIONAL BIOPSY Left 09/1995   BREAST SURGERY  1997   cyst left breast rem. benign   COLONOSCOPY  06/2015   polyp recheck 5 yrs   EXCISION VAGINAL CYST N/A 03/02/2016   Procedure: excision of vaginal polyp;  Surgeon: Nunzio Cobbs, MD;  Location: [facility];  Service: Gynecology;  Laterality: N/A;   KNEE ARTHROSCOPY Right 07/16/2013   Procedure: RIGHT ARTHROSCOPY KNEE, partial lateral menisectomy and chrondromalasia, with microfracture of lateral femeral chondyl, and excision of plica ;  Surgeon: Kerin Salen, MD;  Location: [facility];  Service: Orthopedics;  Laterality: Right;   KNEE ARTHROSCOPY Right 07/05/2014   LIPOMA EXCISION     RECONSTRUCTION OF NOSE  1986   SHOULDER ARTHROSCOPY WITH ROTATOR CUFF REPAIR AND SUBACROMIAL DECOMPRESSION Left 08/02/2014   Procedure: SHOULDER ARTHROSCOPY WITH ROTATOR CUFF REPAIR AND SUBACROMIAL DECOMPRESSION;  Surgeon: Hessie Dibble, MD;  Location: [facility];  Service: Orthopedics;  Laterality: Left;   SHOULDER SURGERY Right 2003   rt   STERIOD INJECTION Left 08/02/2014   Procedure: LEFT THUMB INJECTION;  Surgeon: Hessie Dibble, MD;  Location: [facility];  Service: Orthopedics;  Laterality: Left;   TOTAL KNEE ARTHROPLASTY Right 05/03/2016  Procedure: RIGHT TOTAL KNEE ARTHROPLASTY;  Surgeon: Elsie Saas, MD;  Location: [facility];  Service: Orthopedics;  Laterality: Right;   TOTAL KNEE REVISION Right 08/02/2018   Procedure: Right total knee arthroplasty poly revision;  Surgeon: Gaynelle Arabian, MD;  Location: [facility];  Service: Orthopedics;  Laterality: Right;  39mn    Current Outpatient Medications  Medication Sig Dispense Refill   Cholecalciferol (VITAMIN D-3 PO) Take by mouth.     Multiple Vitamins-Minerals (MULTIVITAMIN WOMEN PO) Take 1 tablet by mouth daily.     predniSONE (DELTASONE) 20 MG tablet Take 3 tabs PO daily x 5 days. 15 tablet 0   No current facility-administered medications for this  visit.    Family History  Problem Relation Age of Onset   Diabetes Mother    Hyperlipidemia Mother    Hypertension Mother    Ulcers Mother    Fibromyalgia Mother    Hyperlipidemia Father    Seizures Father    Hypertension Father    Dementia Father    Atrial fibrillation Father    Hyperlipidemia Brother    Cancer Maternal Grandmother    Sudden death Maternal Grandfather    Cancer Paternal Grandmother        breast/liver   Breast cancer Paternal Grandmother    Heart attack Neg Hx     Review of Systems  All other systems reviewed and are negative.   Exam:   BP 134/80 (BP Location: Right Arm, Patient Position: Sitting, Cuff Size: Large)   Pulse 82   Ht '5\' 4"'$  (1.626 m)   Wt 264 lb (119.7 kg)   LMP 08/20/2002   BMI 45.32 kg/m     General appearance: alert, cooperative and appears stated age Head: normocephalic, without obvious abnormality, atraumatic Neck: no adenopathy, supple, symmetrical, trachea midline and thyroid normal to inspection and palpation Lungs: clear to auscultation bilaterally Breasts: normal appearance, no masses or tenderness, No nipple retraction or dimpling, No nipple discharge or bleeding, No axillary adenopathy Heart: regular rate and rhythm Abdomen: soft, non-tender; no masses, no organomegaly Extremities: extremities normal, atraumatic, no cyanosis or edema Skin: skin color, texture, turgor normal. No rashes or lesions Lymph nodes: cervical, supraclavicular, and axillary nodes normal. Neurologic: grossly normal  Pelvic: External genitalia:  no lesions              No abnormal inguinal nodes palpated.              Urethra:  normal appearing urethra with no masses, tenderness or lesions              Bartholins and Skenes: normal                 Vagina: normal appearing vagina with normal color and discharge, no lesions              Cervix: absent              Pap taken: no Bimanual Exam:  Uterus:  absent              Adnexa: no mass, fullness,  tenderness              Rectal exam: yes.  Confirms.              Anus:  normal sphincter tone, external hemorrhoid.  Chaperone was present for exam:  Kimalexis.  Assessment:   Well woman visit with gynecologic exam. Status post TAH.  Ovaries remain.  Menopausal symptoms.   Plan: Mammogram screening  discussed.   Self breast awareness reviewed. Pap and HR HPV as above. Guidelines for Calcium, Vitamin D, regular exercise program including cardiovascular and weight bearing exercise. Covid and RSV vaccines discussed.  Labs with PCP. Follow up annually and prn.   After visit summary provided.

## 2022-06-01 ENCOUNTER — Ambulatory Visit (INDEPENDENT_AMBULATORY_CARE_PROVIDER_SITE_OTHER): Payer: 59 | Admitting: Obstetrics and Gynecology

## 2022-06-01 ENCOUNTER — Other Ambulatory Visit: Payer: Self-pay | Admitting: Obstetrics and Gynecology

## 2022-06-01 ENCOUNTER — Encounter: Payer: Self-pay | Admitting: Obstetrics and Gynecology

## 2022-06-01 VITALS — BP 134/80 | HR 82 | Ht 64.0 in | Wt 264.0 lb

## 2022-06-01 DIAGNOSIS — Z01419 Encounter for gynecological examination (general) (routine) without abnormal findings: Secondary | ICD-10-CM | POA: Diagnosis not present

## 2022-06-01 DIAGNOSIS — Z1231 Encounter for screening mammogram for malignant neoplasm of breast: Secondary | ICD-10-CM

## 2022-06-01 NOTE — Patient Instructions (Signed)

## 2022-06-21 HISTORY — PX: OTHER SURGICAL HISTORY: SHX169

## 2022-07-08 ENCOUNTER — Ambulatory Visit
Admission: EM | Admit: 2022-07-08 | Discharge: 2022-07-08 | Disposition: A | Discharge status: Home / Self Care | Payer: 59 | Attending: Emergency Medicine | Admitting: Emergency Medicine

## 2022-07-08 DIAGNOSIS — J3089 Other allergic rhinitis: Secondary | ICD-10-CM | POA: Diagnosis not present

## 2022-07-08 DIAGNOSIS — J302 Other seasonal allergic rhinitis: Secondary | ICD-10-CM | POA: Diagnosis not present

## 2022-07-08 DIAGNOSIS — J4521 Mild intermittent asthma with (acute) exacerbation: Secondary | ICD-10-CM | POA: Diagnosis not present

## 2022-07-08 MED ORDER — ALBUTEROL SULFATE HFA 108 (90 BASE) MCG/ACT IN AERS: 2.0000 | INHALATION_SPRAY | Freq: Four times a day (QID) | RESPIRATORY_TRACT | 2 refills | Status: AC | PRN

## 2022-07-08 MED ORDER — METHYLPREDNISOLONE SODIUM SUCC 125 MG IJ SOLR
80.0000 mg | Freq: Once | INTRAMUSCULAR | Status: AC
  Administered 2022-07-08: 80 mg via INTRAMUSCULAR

## 2022-07-08 MED ORDER — METHYLPREDNISOLONE 8 MG PO TABS: 32.0000 mg | ORAL_TABLET | Freq: Every day | ORAL | 0 refills | Status: AC

## 2022-07-08 MED ORDER — CETIRIZINE HCL 10 MG PO TABS: 10.0000 mg | ORAL_TABLET | Freq: Every day | ORAL | 1 refills | Status: DC

## 2022-07-08 MED ORDER — FLUTICASONE PROPIONATE 50 MCG/ACT NA SUSP: 1.0000 | Freq: Every day | NASAL | 1 refills | Status: DC

## 2022-07-08 NOTE — Discharge Instructions (Signed)
Your symptoms and my physical exam findings are concerning for exacerbation of your underlying allergies and asthma.     Please see the list below for recommended medications, dosages and frequencies to provide relief of current symptoms:     Solu-Medrol IM (methylprednisolone):  To quickly address your significant respiratory inflammation, you were provided with an injection of Solu-Medrol in the office today.  You should continue to feel the full benefit of the steroid for the next 4 to 6 hours.    Medrol Dosepak (methylprednisolone): This is a steroid that will significantly calm your upper and lower airways, please take the daily recommended quantity of tablets daily with your breakfast meal starting tomorrow morning until the prescription is complete.     Zyrtec (cetirizine): This is an excellent second-generation antihistamine that helps to reduce respiratory inflammatory response to environmental allergens.  In some patients, this medication can cause daytime sleepiness so I recommend that you take 1 tablet daily at bedtime.     Flonase (fluticasone): This is a steroid nasal spray that you use once daily, 1 spray in each nare.  This medication does not work well if you decide to use it only used as you feel you need to, it works best used on a daily basis.  After 3 to 5 days of use, you will notice significant reduction of the inflammation and mucus production that is currently being caused by exposure to allergens, whether seasonal or environmental.  The most common side effect of this medication is nosebleeds.  If you experience a nosebleed, please discontinue use for 1 week, then feel free to resume.  I have provided you with a prescription.     ProAir, Ventolin, Proventil (albuterol): This inhaled medication contains a short acting beta agonist bronchodilator.  This medication works on the smooth muscle that opens and constricts of your airways by relaxing the muscle.  The result of relaxation  of the smooth muscle is increased air movement and improved work of breathing.  This is a short acting medication that can be used every 4-6 hours as needed for increased work of breathing, shortness of breath, wheezing and excessive coughing.  I have provided you with a prescription.   Based on my physical exam findings, I see no need for antibiotics or testing for viruses.   If you find that you have not had improvement of your symptoms in the next 5 to 7 days, please follow-up with your primary care provider or return here to urgent care for repeat evaluation and further recommendations.   Thank you for visiting urgent care today.  We appreciate the opportunity to participate in your care.

## 2022-07-08 NOTE — ED Triage Notes (Signed)
Pt c/o cough and congestion on and off for 1.5 months. Worsening in last 24 hours. Taking Nyquil prn.

## 2022-07-08 NOTE — ED Provider Notes (Signed)
[name]    CSN: [id number] Arrival date & time: 07/08/22  1521    HISTORY   Chief Complaint  Patient presents with   Cough   Nasal Congestion   HPI Marissa [name] is a pleasant, 62 y.o. female who presents to urgent care today. Pt c/o non-productive cough and nasal congestion on and off for 1.5 months. Worsening in last 24 hours. Taking Nyquil prn.  Patient reports a history of asthma and allergies, not currently taking medications for either issue.  Patient states sometimes she coughs so hard that she is incontinent of urine.  Patient states the cough sometimes makes her feel short of breath.  Patient states the congestion in her nose is getting thicker.  Patient denies facial pain, otalgia, fever, body aches, chills, nausea, vomiting, diarrhea.    The history is provided by the patient.   Past Medical History:  Diagnosis Date   Abnormal uterine bleeding    due to fibroids   Anxiety    Arthritis    knee, scheduled for knee replacement 04/2016   Asthma    exercise induced-no inhalers   Complication of anesthesia    COVID-19 09/2020   Dysmenorrhea    2000  from fibroids   Family history of adverse reaction to anesthesia 02/26/2016   father had shoulder replacement-admitted back to hospital for related complications   Fibroid    Genital warts 1994   Tx  x2   GERD (gastroesophageal reflux disease)    Head injury with loss of consciousness ([place]) 1986   Hyperlipidemia    PONV (postoperative nausea and vomiting)    Primary localized osteoarthrosis of the knee, right    Sleep apnea    has a cpap-cannot use-makes her sick   Unilateral post-traumatic osteoarthritis, right knee 04/21/2016   Patient Active Problem List   Diagnosis Date Noted   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   Post-traumatic osteoarthritis of right knee 05/03/2016   Unilateral post-traumatic osteoarthritis, right knee 04/21/2016   Primary localized osteoarthrosis of the  knee, right    Colon polyp 03/23/2013   Asthma, mild intermittent 01/25/2013   Class 3 obesity without serious comorbidity with body mass index (BMI) of 40.0 to 44.9 in adult 01/25/2013   Cough 08/16/2011   Right hand pain 01/18/2011   Obstructive sleep apnea 07/10/2010   Intrinsic asthma 06/25/2010   DYSPNEA ON EXERTION 05/14/2010   Hyperlipidemia 07/14/2007   GERD 07/14/2007   Diaphragmatic hernia 04/06/2007   Past Surgical History:  Procedure Laterality Date   ABDOMINAL HYSTERECTOMY  09/11/2002   ovaries retained. 2o fibroids   APPENDECTOMY  1982   BREAST EXCISIONAL BIOPSY Left 09/1995   BREAST SURGERY  1997   cyst left breast rem. benign   COLONOSCOPY  06/2015   polyp recheck 5 yrs   EXCISION VAGINAL CYST N/A 03/02/2016   Procedure: excision of vaginal polyp;  Surgeon: Nunzio Cobbs, MD;  Location: [facility];  Service: Gynecology;  Laterality: N/A;   KNEE ARTHROSCOPY Right 07/16/2013   Procedure: RIGHT ARTHROSCOPY KNEE, partial lateral menisectomy and chrondromalasia, with microfracture of lateral femeral chondyl, and excision of plica ;  Surgeon: Kerin Salen, MD;  Location: [facility];  Service: Orthopedics;  Laterality: Right;   KNEE ARTHROSCOPY Right 07/05/2014   LIPOMA EXCISION     RECONSTRUCTION OF NOSE  1986   SHOULDER ARTHROSCOPY WITH ROTATOR CUFF REPAIR AND SUBACROMIAL DECOMPRESSION Left 08/02/2014   Procedure: SHOULDER ARTHROSCOPY WITH ROTATOR  CUFF REPAIR AND SUBACROMIAL DECOMPRESSION;  Surgeon: Hessie Dibble, MD;  Location: [facility];  Service: Orthopedics;  Laterality: Left;   SHOULDER SURGERY Right 2003   rt   STERIOD INJECTION Left 08/02/2014   Procedure: LEFT THUMB INJECTION;  Surgeon: Hessie Dibble, MD;  Location: [facility];  Service: Orthopedics;  Laterality: Left;   TOTAL KNEE ARTHROPLASTY Right 05/03/2016   Procedure: RIGHT TOTAL KNEE ARTHROPLASTY;  Surgeon: Elsie Saas, MD;  Location: [facility];   Service: Orthopedics;  Laterality: Right;   TOTAL KNEE REVISION Right 08/02/2018   Procedure: Right total knee arthroplasty poly revision;  Surgeon: Gaynelle Arabian, MD;  Location: [facility];  Service: Orthopedics;  Laterality: Right;  83mn   OB History     Gravida  0   Para      Term      Preterm      AB      Living         SAB      IAB      Ectopic      Multiple      Live Births             Home Medications    Prior to Admission medications   Medication Sig Start Date End Date Taking? Authorizing Provider  albuterol (VENTOLIN HFA) 108 (90 Base) MCG/ACT inhaler Inhale 2 puffs into the lungs every 6 (six) hours as needed for wheezing or shortness of breath (Cough). 07/08/22  Yes MLynden OxfordScales, PA-C  cetirizine (ZYRTEC ALLERGY) 10 MG tablet Take 1 tablet (10 mg total) by mouth at bedtime. 07/08/22 01/04/23 Yes MLynden OxfordScales, PA-C  fluticasone (FLONASE) 50 MCG/ACT nasal spray Place 1 spray into both nostrils daily. 07/08/22  Yes MLynden OxfordScales, PA-C  methylPREDNISolone (MEDROL) 8 MG tablet Take 4 tablets (32 mg total) by mouth daily for 3 days. 07/08/22 07/11/22 Yes MLynden OxfordScales, PA-C  Cholecalciferol (VITAMIN D-3 PO) Take by mouth.    [provider]  Multiple Vitamins-Minerals (MULTIVITAMIN WOMEN PO) Take 1 tablet by mouth daily.    [provider]  predniSONE (DELTASONE) 20 MG tablet Take 3 tabs PO daily x 5 days. 08/06/21   REliezer Lofts FNP    Family History Family History  Problem Relation Age of Onset   Diabetes Mother    Hyperlipidemia Mother    Hypertension Mother    Ulcers Mother    Fibromyalgia Mother    Hyperlipidemia Father    Seizures Father    Hypertension Father    Dementia Father    Atrial fibrillation Father    Hyperlipidemia Brother    Cancer Maternal Grandmother    Sudden death Maternal Grandfather    Cancer Paternal Grandmother        breast/liver   Breast cancer Paternal Grandmother     Heart attack Neg Hx    Social History Social History   Tobacco Use   Smoking status: Never   Smokeless tobacco: Never  Vaping Use   Vaping Use: Never used  Substance Use Topics   Alcohol use: Yes    Alcohol/week: 4.0 standard drinks of alcohol    Types: 4 Standard drinks or equivalent per week    Comment: weekly   Drug use: No   Allergies   Statins  Review of Systems Review of Systems Pertinent findings revealed after performing a 14 point review of systems has been noted in the history of present illness.  Physical Exam  Triage Vital Signs ED Triage Vitals  Enc Vitals Group     BP 04/17/21 0827 (!) 147/82     Pulse Rate 04/17/21 0827 72     Resp 04/17/21 0827 18     Temp 04/17/21 0827 98.3 F (36.8 C)     Temp Source 04/17/21 0827 Oral     SpO2 04/17/21 0827 98 %     Weight --      Height --      Head Circumference --      Peak Flow --      Pain Score 04/17/21 0826 5     Pain Loc --      Pain Edu? --      Excl. in [place]? --   No data found.  Updated Vital Signs BP (!) 157/90 (BP Location: Right Arm)   Pulse 79   Temp 99.1 F (37.3 C) (Oral)   Resp 18   LMP 08/20/2002   SpO2 98%   Physical Exam Vitals and nursing note reviewed.  Constitutional:      General: She is not in acute distress.    Appearance: Normal appearance. She is not ill-appearing.  HENT:     Head: Normocephalic and atraumatic.     Salivary Glands: Right salivary gland is not diffusely enlarged or tender. Left salivary gland is not diffusely enlarged or tender.     Right Ear: Ear canal and external ear normal. No drainage. A middle ear effusion is present. There is no impacted cerumen. Tympanic membrane is bulging. Tympanic membrane is not injected or erythematous.     Left Ear: Ear canal and external ear normal. No drainage. A middle ear effusion is present. There is no impacted cerumen. Tympanic membrane is bulging. Tympanic membrane is not injected or erythematous.     Ears:     Comments:  Bilateral EACs normal, both TMs bulging with clear fluid    Nose: Rhinorrhea present. No nasal deformity, septal deviation, signs of injury, nasal tenderness, mucosal edema or congestion. Rhinorrhea is clear.     Right Nostril: Occlusion present. No foreign body, epistaxis or septal hematoma.     Left Nostril: Occlusion present. No foreign body, epistaxis or septal hematoma.     Right Turbinates: Enlarged, swollen and pale.     Left Turbinates: Enlarged, swollen and pale.     Right Sinus: No maxillary sinus tenderness or frontal sinus tenderness.     Left Sinus: No maxillary sinus tenderness or frontal sinus tenderness.     Mouth/Throat:     Lips: Pink. No lesions.     Mouth: Mucous membranes are moist. No oral lesions.     Pharynx: Oropharynx is clear. Uvula midline. No posterior oropharyngeal erythema or uvula swelling.     Tonsils: No tonsillar exudate. 0 on the right. 0 on the left.     Comments: Postnasal drip Eyes:     General: Lids are normal.        Right eye: No discharge.        Left eye: No discharge.     Extraocular Movements: Extraocular movements intact.     Conjunctiva/sclera: Conjunctivae normal.     Right eye: Right conjunctiva is not injected.     Left eye: Left conjunctiva is not injected.  Neck:     Trachea: Trachea and phonation normal.  Cardiovascular:     Rate and Rhythm: Normal rate and regular rhythm.     Pulses: Normal pulses.     Heart sounds:  Normal heart sounds. No murmur heard.    No friction rub. No gallop.  Pulmonary:     Effort: Pulmonary effort is normal. No accessory muscle usage, prolonged expiration or respiratory distress.     Breath sounds: Normal breath sounds. No stridor, decreased air movement or transmitted upper airway sounds. No decreased breath sounds, wheezing, rhonchi or rales.  Chest:     Chest wall: No tenderness.  Musculoskeletal:        General: Normal range of motion.     Cervical back: Normal range of motion and neck supple.  Normal range of motion.  Lymphadenopathy:     Cervical: No cervical adenopathy.  Skin:    General: Skin is warm and dry.     Findings: No erythema or rash.  Neurological:     General: No focal deficit present.     Mental Status: She is alert and oriented to person, place, and time.  Psychiatric:        Mood and Affect: Mood normal.        Behavior: Behavior normal.     Visual Acuity Right Eye Distance:   Left Eye Distance:   Bilateral Distance:    Right Eye Near:   Left Eye Near:    Bilateral Near:     UC Couse / Diagnostics / Procedures:     Radiology No results found.  Procedures Procedures (including critical care time) EKG  Pending results:  Labs Reviewed - No data to display  Medications Ordered in UC: Medications  methylPREDNISolone sodium succinate (SOLU-MEDROL) 125 mg/2 mL injection 80 mg (80 mg Intramuscular Given 07/08/22 1554)    UC Diagnoses / Final Clinical Impressions(s)   I have reviewed the triage vital signs and the nursing notes.  Pertinent labs & imaging results that were available during my care of the patient were reviewed by me and considered in my medical decision making (see chart for details).    Final diagnoses:  Perennial allergic rhinitis with seasonal variation  Mild intermittent asthma with (acute) exacerbation   Patient provided with an injection of Solu-Medrol during her visit to address the wheezing appreciated in her lungs on exam.  Patient was also provided with a 3-day course of methylprednisolone p.o.  Patient provided with a renewal of albuterol, antihistamine and nasal steroid and advised to continue all 3 for the next 30 to 60 days.    Conservative care recommended.  Return precautions advised.  Please see discharge instructions below for further details of plan of care as provided to patient. ED Prescriptions     Medication Sig Dispense Auth. Provider   methylPREDNISolone (MEDROL) 8 MG tablet Take 4 tablets (32 mg  total) by mouth daily for 3 days. 12 tablet Lynden Oxford Scales, PA-C   cetirizine (ZYRTEC ALLERGY) 10 MG tablet Take 1 tablet (10 mg total) by mouth at bedtime. 90 tablet Lynden Oxford Scales, PA-C   fluticasone (FLONASE) 50 MCG/ACT nasal spray Place 1 spray into both nostrils daily. 47.4 mL Lynden Oxford Scales, PA-C   albuterol (VENTOLIN HFA) 108 (90 Base) MCG/ACT inhaler Inhale 2 puffs into the lungs every 6 (six) hours as needed for wheezing or shortness of breath (Cough). 54 g Lynden Oxford Scales, PA-C      PDMP not reviewed this encounter.  Disposition Upon Discharge:  Condition: stable for discharge home Home: take medications as prescribed; routine discharge instructions as discussed; follow up as advised.  Patient presented with an acute illness with associated systemic symptoms and significant discomfort  requiring urgent management. In my opinion, this is a condition that a prudent lay person (someone who possesses an average knowledge of health and medicine) may potentially expect to result in complications if not addressed urgently such as respiratory distress, impairment of bodily function or dysfunction of bodily organs.   Routine symptom specific, illness specific and/or disease specific instructions were discussed with the patient and/or caregiver at length.   As such, the patient has been evaluated and assessed, work-up was performed and treatment was provided in alignment with urgent care protocols and evidence based medicine.  Patient/parent/caregiver has been advised that the patient may require follow up for further testing and treatment if the symptoms continue in spite of treatment, as clinically indicated and appropriate.  If the patient was tested for COVID-19, Influenza and/or RSV, then the patient/parent/guardian was advised to isolate at home pending the results of his/her diagnostic coronavirus test and potentially longer if they're positive. I have also  advised pt that if his/her COVID-19 test returns positive, it's recommended to self-isolate for at least 10 days after symptoms first appeared AND until fever-free for 24 hours without fever reducer AND other symptoms have improved or resolved. Discussed self-isolation recommendations as well as instructions for household member/close contacts as per the [facility] and [place] [facility], and also gave patient the [place] packet with this information.  Patient/parent/caregiver has been advised to return to the [facility] or PCP in 3-5 days if no better; to PCP or the Emergency Department if new signs and symptoms develop, or if the current signs or symptoms continue to change or worsen for further workup, evaluation and treatment as clinically indicated and appropriate  The patient will follow up with their current PCP if and as advised. If the patient does not currently have a PCP we will assist them in obtaining one.   The patient may need specialty follow up if the symptoms continue, in spite of conservative treatment and management, for further workup, evaluation, consultation and treatment as clinically indicated and appropriate.  Patient/parent/caregiver verbalized understanding and agreement of plan as discussed.  All questions were addressed during visit.  Please see discharge instructions below for further details of plan.  Discharge Instructions:   Discharge Instructions      Your symptoms and my physical exam findings are concerning for exacerbation of your underlying allergies and asthma.     Please see the list below for recommended medications, dosages and frequencies to provide relief of current symptoms:     Solu-Medrol IM (methylprednisolone):  To quickly address your significant respiratory inflammation, you were provided with an injection of Solu-Medrol in the office today.  You should continue to feel the full benefit of the steroid for the next 4 to 6 hours.    Medrol Dosepak (methylprednisolone):  This is a steroid that will significantly calm your upper and lower airways, please take the daily recommended quantity of tablets daily with your breakfast meal starting tomorrow morning until the prescription is complete.     Zyrtec (cetirizine): This is an excellent second-generation antihistamine that helps to reduce respiratory inflammatory response to environmental allergens.  In some patients, this medication can cause daytime sleepiness so I recommend that you take 1 tablet daily at bedtime.     Flonase (fluticasone): This is a steroid nasal spray that you use once daily, 1 spray in each nare.  This medication does not work well if you decide to use it only used as you feel you need to, it works best  used on a daily basis.  After 3 to 5 days of use, you will notice significant reduction of the inflammation and mucus production that is currently being caused by exposure to allergens, whether seasonal or environmental.  The most common side effect of this medication is nosebleeds.  If you experience a nosebleed, please discontinue use for 1 week, then feel free to resume.  I have provided you with a prescription.     ProAir, Ventolin, Proventil (albuterol): This inhaled medication contains a short acting beta agonist bronchodilator.  This medication works on the smooth muscle that opens and constricts of your airways by relaxing the muscle.  The result of relaxation of the smooth muscle is increased air movement and improved work of breathing.  This is a short acting medication that can be used every 4-6 hours as needed for increased work of breathing, shortness of breath, wheezing and excessive coughing.  I have provided you with a prescription.   Based on my physical exam findings, I see no need for antibiotics or testing for viruses.   If you find that you have not had improvement of your symptoms in the next 5 to 7 days, please follow-up with your primary care provider or return here to urgent  care for repeat evaluation and further recommendations.   Thank you for visiting urgent care today.  We appreciate the opportunity to participate in your care.      This office note has been dictated using Museum/gallery curator.  Unfortunately, this method of dictation can sometimes lead to typographical or grammatical errors.  I apologize for your inconvenience in advance if this occurs.  Please do not hesitate to reach out to me if clarification is needed.      Lynden Oxford Scales, [place] 07/08/22 [phone]

## 2022-07-12 ENCOUNTER — Ambulatory Visit (INDEPENDENT_AMBULATORY_CARE_PROVIDER_SITE_OTHER): Payer: 59

## 2022-07-12 ENCOUNTER — Ambulatory Visit
Admission: EM | Admit: 2022-07-12 | Discharge: 2022-07-12 | Disposition: A | Discharge status: Home / Self Care | Payer: 59

## 2022-07-12 DIAGNOSIS — H66003 Acute suppurative otitis media without spontaneous rupture of ear drum, bilateral: Secondary | ICD-10-CM | POA: Diagnosis not present

## 2022-07-12 DIAGNOSIS — J22 Unspecified acute lower respiratory infection: Secondary | ICD-10-CM

## 2022-07-12 MED ORDER — AMOXICILLIN-POT CLAVULANATE 875-125 MG PO TABS: 1.0000 | ORAL_TABLET | Freq: Two times a day (BID) | ORAL | 0 refills | Status: AC

## 2022-07-12 MED ORDER — PROMETHAZINE-DM 6.25-15 MG/5ML PO SYRP: 5.0000 mL | ORAL_SOLUTION | Freq: Every evening | ORAL | 0 refills | Status: DC | PRN

## 2022-07-12 MED ORDER — GUAIFENESIN 400 MG PO TABS: ORAL_TABLET | ORAL | 0 refills | Status: DC

## 2022-07-12 NOTE — Discharge Instructions (Addendum)
The x-ray of your chest was not concerning for pneumonia at this time.  Because you currently have signs of active bacterial infection in both of your inner ears, I still recommend that you begin antibiotics.  Please read below to learn more about the medications, dosages and frequencies that I recommend to help alleviate your symptoms and to get you feeling better soon:   Augmentin (amoxicillin - clavulanic acid):  take 1 tablet twice daily for 10 days, you can take it with or without food.  This antibiotic can cause upset stomach, this will resolve once antibiotics are complete.  You are welcome to use a probiotic, eat yogurt, take Imodium while taking this medication.  Please avoid other systemic medications such as Maalox, Pepto-Bismol or milk of magnesia as they can interfere with your body's ability to absorb the antibiotics.       Zyrtec (cetirizine): This is an excellent second-generation antihistamine that helps to reduce respiratory inflammatory response to environmental allergens.  In some patients, this medication can cause daytime sleepiness so I recommend that you take 1 tablet daily at bedtime.     Flonase (fluticasone): This is a steroid nasal spray that you use once daily, 1 spray in each nare.  This medication does not work well if you decide to use it only used as you feel you need to, it works best used on a daily basis.  After 3 to 5 days of use, you will notice significant reduction of the inflammation and mucus production that is currently being caused by exposure to allergens, whether seasonal or environmental.  The most common side effect of this medication is nosebleeds.  If you experience a nosebleed, please discontinue use for 1 week, then feel free to resume.  I have provided you with a prescription.     ProAir, Ventolin, Proventil (albuterol): This inhaled medication contains a short acting beta agonist bronchodilator.  This medication works on the smooth muscle that opens and  constricts of your airways by relaxing the muscle.  The result of relaxation of the smooth muscle is increased air movement and improved work of breathing.  This is a short acting medication that can be used every 4-6 hours as needed for increased work of breathing, shortness of breath, wheezing and excessive coughing.  I have provided you with a prescription.    Robitussin, Mucinex (guaifenesin): This is an expectorant.  This helps break up chest congestion and loosen up thick nasal drainage making phlegm and drainage more liquid and therefore easier to remove.  I recommend being 400 mg three times daily as needed.      Promethazine DM: Promethazine is both a nasal decongestant and an antinausea medication that makes patients feel fairly sleepy.  The DM is dextromethorphan, a cough suppressant found in many over-the-counter cough medications.  Please take 5 mL before bedtime to minimize your cough which will help you sleep better.     Please follow-up within the next 5-7 days either with your primary care provider or urgent care if your symptoms do not resolve.  If you do not have a primary care provider, we will assist you in finding one.        Thank you for visiting urgent care today.  We appreciate the opportunity to participate in your care.

## 2022-07-12 NOTE — ED Triage Notes (Signed)
Pt presents to Urgent Care with c/o worsening nasal congestion and productive cough x 5 days. Also has headache and rib pain from coughing. States she recently visited [facility] and was diagnosed with allergic rhinitis. Pt states "this is not allergies."

## 2022-07-12 NOTE — ED Provider Notes (Signed)
[name]    CSN: [id number] Arrival date & time: 07/12/22  1056    HISTORY   Chief Complaint  Patient presents with   Cough   Nasal Congestion   HPI Marissa [name] is a pleasant, 62 y.o. female who presents to urgent care today. Patient returns to urgent care today after being seen 5 days ago for chief complaint of 6-week history of cough and nasal congestion which have become worse in the last 24 hours or so.  At that visit, she was diagnosed with exacerbation of mild intermittent asthma as well as perennial allergic rhinitis with seasonal variation.  Patient was provided with a steroid injection during her visit today course of prednisone, which she states helped a little bit.  Patient was also advised to begin Zyrtec, Flonase and to use albuterol 2 puffs 4 times daily.  Patient states today that she continues to have worsening nasal congestion and a productive cough since her last visit.  Patient states she also has a headache, rib and lower back pain secondary to coughing.  Patient states she does not believe that she is currently experiencing the symptoms due to allergy.  Patient states she is also unable to sleep at night secondary to cough.  Oxygen saturation on arrival today is decreased from previous.  The history is provided by the patient.   Past Medical History:  Diagnosis Date   Abnormal uterine bleeding    due to fibroids   Anxiety    Arthritis    knee, scheduled for knee replacement 04/2016   Asthma    exercise induced-no inhalers   Complication of anesthesia    COVID-19 09/2020   Dysmenorrhea    2000  from fibroids   Family history of adverse reaction to anesthesia 02/26/2016   father had shoulder replacement-admitted back to hospital for related complications   Fibroid    Genital warts 1994   Tx  x2   GERD (gastroesophageal reflux disease)    Head injury with loss of consciousness ([place]) 1986   Hyperlipidemia    PONV (postoperative nausea  and vomiting)    Primary localized osteoarthrosis of the knee, right    Sleep apnea    has a cpap-cannot use-makes her sick   Unilateral post-traumatic osteoarthritis, right knee 04/21/2016   Patient Active Problem List   Diagnosis Date Noted   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   Post-traumatic osteoarthritis of right knee 05/03/2016   Unilateral post-traumatic osteoarthritis, right knee 04/21/2016   Primary localized osteoarthrosis of the knee, right    Colon polyp 03/23/2013   Asthma, mild intermittent 01/25/2013   Class 3 obesity without serious comorbidity with body mass index (BMI) of 40.0 to 44.9 in adult 01/25/2013   Cough 08/16/2011   Right hand pain 01/18/2011   Obstructive sleep apnea 07/10/2010   Intrinsic asthma 06/25/2010   DYSPNEA ON EXERTION 05/14/2010   Hyperlipidemia 07/14/2007   GERD 07/14/2007   Diaphragmatic hernia 04/06/2007   Past Surgical History:  Procedure Laterality Date   ABDOMINAL HYSTERECTOMY  09/11/2002   ovaries retained. 2o fibroids   APPENDECTOMY  1982   BREAST EXCISIONAL BIOPSY Left 09/1995   BREAST SURGERY  1997   cyst left breast rem. benign   COLONOSCOPY  06/2015   polyp recheck 5 yrs   EXCISION VAGINAL CYST N/A 03/02/2016   Procedure: excision of vaginal polyp;  Surgeon: Nunzio Cobbs, MD;  Location: [facility];  Service: Gynecology;  Laterality: N/A;  KNEE ARTHROSCOPY Right 07/16/2013   Procedure: RIGHT ARTHROSCOPY KNEE, partial lateral menisectomy and chrondromalasia, with microfracture of lateral femeral chondyl, and excision of plica ;  Surgeon: Kerin Salen, MD;  Location: [facility];  Service: Orthopedics;  Laterality: Right;   KNEE ARTHROSCOPY Right 07/05/2014   LIPOMA EXCISION     RECONSTRUCTION OF NOSE  1986   SHOULDER ARTHROSCOPY WITH ROTATOR CUFF REPAIR AND SUBACROMIAL DECOMPRESSION Left 08/02/2014   Procedure: SHOULDER ARTHROSCOPY WITH ROTATOR CUFF REPAIR AND SUBACROMIAL DECOMPRESSION;   Surgeon: Hessie Dibble, MD;  Location: [facility];  Service: Orthopedics;  Laterality: Left;   SHOULDER SURGERY Right 2003   rt   STERIOD INJECTION Left 08/02/2014   Procedure: LEFT THUMB INJECTION;  Surgeon: Hessie Dibble, MD;  Location: [facility];  Service: Orthopedics;  Laterality: Left;   TOTAL KNEE ARTHROPLASTY Right 05/03/2016   Procedure: RIGHT TOTAL KNEE ARTHROPLASTY;  Surgeon: Elsie Saas, MD;  Location: [facility];  Service: Orthopedics;  Laterality: Right;   TOTAL KNEE REVISION Right 08/02/2018   Procedure: Right total knee arthroplasty poly revision;  Surgeon: Gaynelle Arabian, MD;  Location: [facility];  Service: Orthopedics;  Laterality: Right;  47mn   OB History     Gravida  0   Para      Term      Preterm      AB      Living         SAB      IAB      Ectopic      Multiple      Live Births             Home Medications    Prior to Admission medications   Medication Sig Start Date End Date Taking? Authorizing Provider  DM-Doxylamine-Acetaminophen (NYQUIL COLD & FLU PO) Take by mouth.   Yes [provider]  albuterol (VENTOLIN HFA) 108 (90 Base) MCG/ACT inhaler Inhale 2 puffs into the lungs every 6 (six) hours as needed for wheezing or shortness of breath (Cough). 07/08/22   MLynden OxfordScales, PA-C  cetirizine (ZYRTEC ALLERGY) 10 MG tablet Take 1 tablet (10 mg total) by mouth at bedtime. 07/08/22 01/04/23  MLynden OxfordScales, PA-C  Cholecalciferol (VITAMIN D-3 PO) Take by mouth.    [provider]  fluticasone (FLONASE) 50 MCG/ACT nasal spray Place 1 spray into both nostrils daily. 07/08/22   MLynden OxfordScales, PA-C  Multiple Vitamins-Minerals (MULTIVITAMIN WOMEN PO) Take 1 tablet by mouth daily.    [provider]  predniSONE (DELTASONE) 20 MG tablet Take 3 tabs PO daily x 5 days. 08/06/21   REliezer Lofts FNP    Family History Family History  Problem Relation Age of Onset   Diabetes  Mother    Hyperlipidemia Mother    Hypertension Mother    Ulcers Mother    Fibromyalgia Mother    Hyperlipidemia Father    Seizures Father    Hypertension Father    Dementia Father    Atrial fibrillation Father    Hyperlipidemia Brother    Cancer Maternal Grandmother    Sudden death Maternal Grandfather    Cancer Paternal Grandmother        breast/liver   Breast cancer Paternal Grandmother    Heart attack Neg Hx    Social History Social History   Tobacco Use   Smoking status: Never   Smokeless tobacco: Never  Vaping Use   Vaping Use: Never used  Substance Use Topics   Alcohol use: Yes    Alcohol/week: 4.0 standard drinks of alcohol    Types: 4 Standard drinks or equivalent per week    Comment: weekly   Drug use: No   Allergies   Statins  Review of Systems Review of Systems Pertinent findings revealed after performing a 14 point review of systems has been noted in the history of present illness.  Physical Exam Triage Vital Signs ED Triage Vitals  Enc Vitals Group     BP 04/17/21 0827 (!) 147/82     Pulse Rate 04/17/21 0827 72     Resp 04/17/21 0827 18     Temp 04/17/21 0827 98.3 F (36.8 C)     Temp Source 04/17/21 0827 Oral     SpO2 04/17/21 0827 98 %     Weight --      Height --      Head Circumference --      Peak Flow --      Pain Score 04/17/21 0826 5     Pain Loc --      Pain Edu? --      Excl. in [place]? --   No data found.  Updated Vital Signs BP (!) 145/87 (BP Location: Left Arm)   Pulse 69   Temp 97.8 F (36.6 C) (Oral)   Resp 20   Ht '5\' 4"'$  (1.626 m)   Wt 265 lb (120.2 kg)   LMP 08/20/2002   SpO2 95%   BMI 45.49 kg/m   Physical Exam Vitals and nursing note reviewed.  Constitutional:      General: She is not in acute distress.    Appearance: Normal appearance. She is not ill-appearing.  HENT:     Head: Normocephalic and atraumatic.     Salivary Glands: Right salivary gland is not diffusely enlarged or tender. Left salivary gland is  not diffusely enlarged or tender.     Right Ear: Hearing, ear canal and external ear normal. No drainage. A middle ear effusion is present. There is no impacted cerumen. Tympanic membrane is injected, erythematous and bulging.     Left Ear: Hearing, ear canal and external ear normal. No drainage. A middle ear effusion is present. There is no impacted cerumen. Tympanic membrane is injected, erythematous and bulging.     Nose: Rhinorrhea present. No nasal deformity, septal deviation, mucosal edema or congestion. Rhinorrhea is clear.     Right Nostril: No foreign body, epistaxis, septal hematoma or occlusion.     Left Nostril: No foreign body, epistaxis, septal hematoma or occlusion.     Right Turbinates: Enlarged and swollen. Not pale.     Left Turbinates: Enlarged and swollen. Not pale.     Right Sinus: No maxillary sinus tenderness or frontal sinus tenderness.     Left Sinus: No maxillary sinus tenderness or frontal sinus tenderness.     Mouth/Throat:     Lips: Pink. No lesions.     Mouth: Mucous membranes are moist. No oral lesions.     Pharynx: Uvula midline. Pharyngeal swelling and posterior oropharyngeal erythema present. No oropharyngeal exudate or uvula swelling.     Tonsils: No tonsillar exudate. 0 on the right. 0 on the left.  Eyes:     General: Lids are normal.        Right eye: No discharge.        Left eye: No discharge.     Extraocular Movements: Extraocular movements intact.     Conjunctiva/sclera: Conjunctivae  normal.     Right eye: Right conjunctiva is not injected.     Left eye: Left conjunctiva is not injected.  Neck:     Trachea: Trachea and phonation normal. No tracheal tenderness.  Cardiovascular:     Rate and Rhythm: Normal rate and regular rhythm.     Pulses: Normal pulses.     Heart sounds: Normal heart sounds. No murmur heard.    No friction rub. No gallop.  Pulmonary:     Effort: Pulmonary effort is normal. No tachypnea, bradypnea, accessory muscle usage,  prolonged expiration, respiratory distress or retractions.     Breath sounds: No stridor, decreased air movement or transmitted upper airway sounds. Examination of the right-middle field reveals rales. Examination of the right-lower field reveals rales. Rales present. No decreased breath sounds, wheezing or rhonchi.  Chest:     Chest wall: No tenderness.  Abdominal:     General: Abdomen is flat. Bowel sounds are normal.     Palpations: Abdomen is soft.     Tenderness: There is no abdominal tenderness.     Hernia: No hernia is present.  Musculoskeletal:        General: Normal range of motion.     Cervical back: Normal range of motion and neck supple. No edema, erythema, rigidity or crepitus. No pain with movement. Normal range of motion.     Right lower leg: No edema.     Left lower leg: No edema.  Lymphadenopathy:     Cervical: No cervical adenopathy.  Skin:    General: Skin is warm and dry.     Findings: No erythema or rash.     Comments: Skin is warm and dry to touch, good turgor, no rash appreciated  Neurological:     General: No focal deficit present.     Mental Status: She is alert and oriented to person, place, and time.     Motor: Motor function is intact.     Coordination: Coordination is intact.     Gait: Gait is intact.     Deep Tendon Reflexes:     Reflex Scores:      Patellar reflexes are 2+ on the right side and 2+ on the left side. Psychiatric:        Attention and Perception: Attention and perception normal.        Mood and Affect: Mood normal.        Speech: Speech normal.        Behavior: Behavior normal. Behavior is cooperative.        Thought Content: Thought content normal.        Cognition and Memory: Cognition normal.        Judgment: Judgment normal.     Visual Acuity Right Eye Distance:   Left Eye Distance:   Bilateral Distance:    Right Eye Near:   Left Eye Near:    Bilateral Near:     UC Couse / Diagnostics / Procedures:     Radiology DG  Chest 2 View  Result Date: 07/12/2022 CLINICAL DATA:  rales RML/RLL EXAM: CHEST - 2 VIEW COMPARISON:  Radiograph 08/06/2021 FINDINGS: Unchanged cardiomediastinal silhouette. There is no focal airspace consolidation. There is no pleural effusion or evidence of pneumothorax. No acute osseous abnormality. Thoracic spondylosis. IMPRESSION: No evidence of acute cardiopulmonary disease. Electronically Signed   By: Maurine Simmering M.D.   On: 07/12/2022 12:15    Procedures Procedures (including critical care time) EKG  Pending results:  Labs Reviewed -  No data to display  Medications Ordered in UC: Medications - No data to display  UC Diagnoses / Final Clinical Impressions(s)   I have reviewed the triage vital signs and the nursing notes.  Pertinent labs & imaging results that were available during my care of the patient were reviewed by me and considered in my medical decision making (see chart for details).    Final diagnoses:  Non-recurrent acute suppurative otitis media of both ears without spontaneous rupture of tympanic membranes  Lower respiratory tract infection   Patient advised of radiologist findings.  Per my personal read of her x-ray, as it correlates with my physical exam findings, believe that she is showing early signs of pneumonia and should be treated as such with a 10-day course of Augmentin.  Patient will also be treated empirically for presumed bilateral bacterial otitis media with Augmentin as well.  Patient advised to continue allergy and medications.  Recommend Robitussin for daytime cough and Promethazine DM to help patient get some rest.  Conservative care recommended.  Return precautions advised. Please see discharge instructions below for further details of plan of care as provided to patient. ED Prescriptions     Medication Sig Dispense Auth. Provider   amoxicillin-clavulanate (AUGMENTIN) 875-125 MG tablet Take 1 tablet by mouth 2 (two) times daily for 10 days. 20 tablet  Lynden Oxford Scales, PA-C   guaifenesin (HUMIBID E) 400 MG TABS tablet Take 1 tablet 3 times daily as needed for chest congestion and cough 30 tablet Lynden Oxford Scales, PA-C   promethazine-dextromethorphan (PROMETHAZINE-DM) 6.25-15 MG/5ML syrup Take 5 mLs by mouth at bedtime as needed for cough. 60 mL Lynden Oxford Scales, PA-C      PDMP not reviewed this encounter.  Disposition Upon Discharge:  Condition: stable for discharge home Home: take medications as prescribed; routine discharge instructions as discussed; follow up as advised.  Patient presented with an acute illness with associated systemic symptoms and significant discomfort requiring urgent management. In my opinion, this is a condition that a prudent lay person (someone who possesses an average knowledge of health and medicine) may potentially expect to result in complications if not addressed urgently such as respiratory distress, impairment of bodily function or dysfunction of bodily organs.   Routine symptom specific, illness specific and/or disease specific instructions were discussed with the patient and/or caregiver at length.   As such, the patient has been evaluated and assessed, work-up was performed and treatment was provided in alignment with urgent care protocols and evidence based medicine.  Patient/parent/caregiver has been advised that the patient may require follow up for further testing and treatment if the symptoms continue in spite of treatment, as clinically indicated and appropriate.  If the patient was tested for COVID-19, Influenza and/or RSV, then the patient/parent/guardian was advised to isolate at home pending the results of his/her diagnostic coronavirus test and potentially longer if they're positive. I have also advised pt that if his/her COVID-19 test returns positive, it's recommended to self-isolate for at least 10 days after symptoms first appeared AND until fever-free for 24 hours without  fever reducer AND other symptoms have improved or resolved. Discussed self-isolation recommendations as well as instructions for household member/close contacts as per the [facility] and [place] [facility], and also gave patient the [facility] packet with this information.  Patient/parent/caregiver has been advised to return to the [facility] or PCP in 3-5 days if no better; to PCP or the Emergency Department if new signs and symptoms develop, or if the current  signs or symptoms continue to change or worsen for further workup, evaluation and treatment as clinically indicated and appropriate  The patient will follow up with their current PCP if and as advised. If the patient does not currently have a PCP we will assist them in obtaining one.   The patient may need specialty follow up if the symptoms continue, in spite of conservative treatment and management, for further workup, evaluation, consultation and treatment as clinically indicated and appropriate.  Patient/parent/caregiver verbalized understanding and agreement of plan as discussed.  All questions were addressed during visit.  Please see discharge instructions below for further details of plan.  Discharge Instructions:   Discharge Instructions      The x-ray of your chest was not concerning for pneumonia at this time.  Because you currently have signs of active bacterial infection in both of your inner ears, I still recommend that you begin antibiotics.  Please read below to learn more about the medications, dosages and frequencies that I recommend to help alleviate your symptoms and to get you feeling better soon:   Augmentin (amoxicillin - clavulanic acid):  take 1 tablet twice daily for 10 days, you can take it with or without food.  This antibiotic can cause upset stomach, this will resolve once antibiotics are complete.  You are welcome to use a probiotic, eat yogurt, take Imodium while taking this medication.  Please avoid other systemic medications such as  Maalox, Pepto-Bismol or milk of magnesia as they can interfere with your body's ability to absorb the antibiotics.       Zyrtec (cetirizine): This is an excellent second-generation antihistamine that helps to reduce respiratory inflammatory response to environmental allergens.  In some patients, this medication can cause daytime sleepiness so I recommend that you take 1 tablet daily at bedtime.     Flonase (fluticasone): This is a steroid nasal spray that you use once daily, 1 spray in each nare.  This medication does not work well if you decide to use it only used as you feel you need to, it works best used on a daily basis.  After 3 to 5 days of use, you will notice significant reduction of the inflammation and mucus production that is currently being caused by exposure to allergens, whether seasonal or environmental.  The most common side effect of this medication is nosebleeds.  If you experience a nosebleed, please discontinue use for 1 week, then feel free to resume.  I have provided you with a prescription.     ProAir, Ventolin, Proventil (albuterol): This inhaled medication contains a short acting beta agonist bronchodilator.  This medication works on the smooth muscle that opens and constricts of your airways by relaxing the muscle.  The result of relaxation of the smooth muscle is increased air movement and improved work of breathing.  This is a short acting medication that can be used every 4-6 hours as needed for increased work of breathing, shortness of breath, wheezing and excessive coughing.  I have provided you with a prescription.    Robitussin, Mucinex (guaifenesin): This is an expectorant.  This helps break up chest congestion and loosen up thick nasal drainage making phlegm and drainage more liquid and therefore easier to remove.  I recommend being 400 mg three times daily as needed.      Promethazine DM: Promethazine is both a nasal decongestant and an antinausea medication that makes  patients feel fairly sleepy.  The DM is dextromethorphan, a cough suppressant found in  many over-the-counter cough medications.  Please take 5 mL before bedtime to minimize your cough which will help you sleep better.     Please follow-up within the next 5-7 days either with your primary care provider or urgent care if your symptoms do not resolve.  If you do not have a primary care provider, we will assist you in finding one.        Thank you for visiting urgent care today.  We appreciate the opportunity to participate in your care.       This office note has been dictated using Museum/gallery curator.  Unfortunately, this method of dictation can sometimes lead to typographical or grammatical errors.  I apologize for your inconvenience in advance if this occurs.  Please do not hesitate to reach out to me if clarification is needed.      Lynden Oxford Scales, PA-C 07/12/22 1424

## 2022-07-27 ENCOUNTER — Ambulatory Visit
Admission: RE | Admit: 2022-07-27 | Discharge: 2022-07-27 | Disposition: A | Discharge status: Home / Self Care | Payer: 59 | Source: Ambulatory Visit | Attending: Obstetrics and Gynecology | Admitting: Obstetrics and Gynecology

## 2022-07-27 DIAGNOSIS — Z1231 Encounter for screening mammogram for malignant neoplasm of breast: Secondary | ICD-10-CM

## 2023-04-26 ENCOUNTER — Telehealth: Payer: Self-pay

## 2023-04-26 NOTE — Telephone Encounter (Signed)
Pt LVM in triage line requesting the names of the antidepressants that Dr. Edward Jolly rx'd her 2-3 years ago.  CB, no answer, LDVM on machine per DPR advising her of the paroxetine and venlafaxine that was in her record and advised to cb if any additional questions/concerns.  Routing to provider for final review and closing encounter.

## 2023-07-15 ENCOUNTER — Other Ambulatory Visit: Payer: Self-pay | Admitting: Obstetrics and Gynecology

## 2023-07-15 DIAGNOSIS — Z1231 Encounter for screening mammogram for malignant neoplasm of breast: Secondary | ICD-10-CM

## 2023-08-03 ENCOUNTER — Ambulatory Visit
Admission: RE | Admit: 2023-08-03 | Discharge: 2023-08-03 | Disposition: A | Discharge status: Home / Self Care | Payer: 59 | Source: Ambulatory Visit | Attending: Obstetrics and Gynecology | Admitting: Obstetrics and Gynecology

## 2023-08-03 DIAGNOSIS — Z1231 Encounter for screening mammogram for malignant neoplasm of breast: Secondary | ICD-10-CM

## 2023-08-06 ENCOUNTER — Encounter: Payer: Self-pay | Admitting: Obstetrics and Gynecology

## 2023-08-08 NOTE — Progress Notes (Signed)
 63 y.o. G0P0 [name] here for annual exam.    Taking Prozac for 6 months through her PCP.  Has improved.  Declines counseling.  States it has not helped in the past.  Will see her PCP next month for a recheck.  Is working on increasing the dosages.  She denies suicidal ideation.   Two hand surgeries in the last year.  Caring for her mother and her finace.  Finace here completed tx for melanoma.   Has some hot flashes.   PCP: Janece Canterbury, MD   Patient's last menstrual period was 08/20/2002.           Sexually active: No.  The current method of family planning is status post hysterectomy.    Menopausal hormone therapy:  n/a Exercising: No.   Smoker:  no  OB History  Gravida Para Term Preterm AB Living  0       SAB IAB Ectopic Multiple Live Births           HEALTH MAINTENANCE: Last 2 paps:    12/24/15 Neg:Neg HR HPV, 12/30/09 Neg  History of abnormal Pap or positive HPV:  no Mammogram:   08/03/23 Breast Density Cat C, BI-RADS CAT 1 neg Colonoscopy:  2022 - [facility] [place].  Bone Density:  n/a  Result  n/a   Immunization History  Administered Date(s) Administered   Influenza Split 03/22/2011   Influenza Whole 04/21/2010   Influenza, Seasonal, Injecte, Preservative Fre 04/04/2016, 04/18/2018   Influenza,Quad,Nasal, Live 03/22/2011   Influenza,inj,Quad PF,6+ Mos 04/18/2018   Influenza,inj,quad, With Preservative 03/21/2018   PFIZER(Purple Top)SARS-COV-2 Vaccination 02/06/2020, 02/27/2020   Pneumococcal Polysaccharide-23 12/07/2016   Tdap 07/22/2007, 03/06/2018      reports that she has never smoked. She has never used smokeless tobacco. She reports current alcohol use of about 4.0 standard drinks of alcohol per week. She reports that she does not use drugs.  Past Medical History:  Diagnosis Date   Abnormal uterine bleeding    due to fibroids   Anxiety    Arthritis    knee, scheduled for knee replacement 04/2016   Asthma     exercise induced-no inhalers   Complication of anesthesia    COVID-19 09/2020   Dysmenorrhea    2000  from fibroids   Family history of adverse reaction to anesthesia 02/26/2016   father had shoulder replacement-admitted back to hospital for related complications   Fibroid    Genital warts 1994   Tx  x2   GERD (gastroesophageal reflux disease)    Head injury with loss of consciousness (HCC) 1986   Hyperlipidemia    PONV (postoperative nausea and vomiting)    Primary localized osteoarthrosis of the knee, right    Sleep apnea    has a cpap-cannot use-makes her sick   Unilateral post-traumatic osteoarthritis, right knee 04/21/2016    Past Surgical History:  Procedure Laterality Date   ABDOMINAL HYSTERECTOMY  09/11/2002   ovaries retained. 2o fibroids   APPENDECTOMY  1982   BREAST EXCISIONAL BIOPSY Left 09/1995   BREAST SURGERY  1997   cyst left breast rem. benign   COLONOSCOPY  06/2015   polyp recheck 5 yrs   EXCISION VAGINAL CYST N/A 03/02/2016   Procedure: excision of vaginal polyp;  Surgeon: Patton Salles, MD;  Location: [facility];  Service: Gynecology;  Laterality: N/A;   KNEE ARTHROSCOPY Right 07/16/2013   Procedure: RIGHT ARTHROSCOPY KNEE, partial lateral menisectomy and chrondromalasia, with microfracture of lateral  femeral chondyl, and excision of plica ;  Surgeon: Nestor Lewandowsky, MD;  Location: [facility];  Service: Orthopedics;  Laterality: Right;   KNEE ARTHROSCOPY Right 07/05/2014   LIPOMA EXCISION     RECONSTRUCTION OF NOSE  1986   SHOULDER ARTHROSCOPY WITH ROTATOR CUFF REPAIR AND SUBACROMIAL DECOMPRESSION Left 08/02/2014   Procedure: SHOULDER ARTHROSCOPY WITH ROTATOR CUFF REPAIR AND SUBACROMIAL DECOMPRESSION;  Surgeon: Velna Ochs, MD;  Location: [facility];  Service: Orthopedics;  Laterality: Left;   SHOULDER SURGERY Right 2003   rt   STERIOD INJECTION Left 08/02/2014   Procedure: LEFT THUMB INJECTION;  Surgeon: Velna Ochs, MD;  Location: [facility];  Service: Orthopedics;  Laterality: Left;   thumb surgery Bilateral 2024   TOTAL KNEE ARTHROPLASTY Right 05/03/2016   Procedure: RIGHT TOTAL KNEE ARTHROPLASTY;  Surgeon: Salvatore Marvel, MD;  Location: [facility];  Service: Orthopedics;  Laterality: Right;   TOTAL KNEE REVISION Right 08/02/2018   Procedure: Right total knee arthroplasty poly revision;  Surgeon: Ollen Gross, MD;  Location: [facility];  Service: Orthopedics;  Laterality: Right;     Current Outpatient Medications  Medication Sig Dispense Refill   albuterol (VENTOLIN HFA) 108 (90 Base) MCG/ACT inhaler Inhale 2 puffs into the lungs every 6 (six) hours as needed for wheezing or shortness of breath (Cough). 54 g 2   Cholecalciferol (VITAMIN D-3 PO) Take by mouth.     DM-Doxylamine-Acetaminophen (NYQUIL COLD & FLU PO) Take by mouth.     FLUoxetine (PROZAC) 20 MG capsule Take 20 mg by mouth daily.     fluticasone (FLONASE) 50 MCG/ACT nasal spray Place 1 spray into both nostrils daily. 47.4 mL 1   guaifenesin (HUMIBID E) 400 MG TABS tablet Take 1 tablet 3 times daily as needed for chest congestion and cough 30 tablet 0   Multiple Vitamins-Minerals (MULTIVITAMIN WOMEN PO) Take 1 tablet by mouth daily.     promethazine-dextromethorphan (PROMETHAZINE-DM) 6.25-15 MG/5ML syrup Take 5 mLs by mouth at bedtime as needed for cough. 60 mL 0   cetirizine (ZYRTEC ALLERGY) 10 MG tablet Take 1 tablet (10 mg total) by mouth at bedtime. 90 tablet 1   No current facility-administered medications for this visit.    ALLERGIES: Statins  Family History  Problem Relation Age of Onset   Diabetes Mother    Hyperlipidemia Mother    Hypertension Mother    Ulcers Mother    Fibromyalgia Mother    Hyperlipidemia Father    Seizures Father    Hypertension Father    Dementia Father    Atrial fibrillation Father    Hyperlipidemia Brother    Cancer Maternal Grandmother    Sudden death Maternal Grandfather     Cancer Paternal Grandmother        breast/liver   Breast cancer Paternal Grandmother    Heart attack Neg Hx     Review of Systems  All other systems reviewed and are negative.   PHYSICAL EXAM:  BP 136/82 (BP Location: Left Arm, Patient Position: Sitting, Cuff Size: Large)   Ht 5' 4.5" (1.638 m)   Wt 265 lb (120.2 kg)   LMP 08/20/2002   BMI 44.78 kg/m     General appearance: alert, cooperative and appears stated age Head: normocephalic, without obvious abnormality, atraumatic Neck: no adenopathy, supple, symmetrical, trachea midline and thyroid normal to inspection and palpation Lungs: clear to auscultation bilaterally Breasts: normal appearance, no masses or tenderness, No nipple retraction or dimpling,  No nipple discharge or bleeding, No axillary adenopathy Heart: regular rate and rhythm Abdomen: soft, non-tender; no masses, no organomegaly Extremities: extremities normal, atraumatic, no cyanosis or edema Skin: skin color, texture, turgor normal. No rashes or lesions Lymph nodes: cervical, supraclavicular, and axillary nodes normal. Neurologic: grossly normal  Pelvic: External genitalia:  no lesions              No abnormal inguinal nodes palpated.              Urethra:  normal appearing urethra with no masses, tenderness or lesions              Bartholins and Skenes: normal                 Vagina: normal appearing vagina with normal color and discharge, no lesions              Cervix: absent              Pap taken: no Bimanual Exam:  Uterus: absent              Adnexa: no mass, fullness, tenderness              Rectal exam: yes.  Confirms.              Anus:  normal sphincter tone, no lesions  Chaperone was present for exam:  Warren Lacy, CMA  ASSESSMENT: Well woman visit with gynecologic exam. Depression.  On Prozac. PHQ-9: 10.    PLAN: Mammogram screening discussed. Self breast awareness reviewed. Pap and HRV collected:  no.  Not indicated.  Guidelines for  Calcium, Vitamin D. We discussed increasing her physical activity with walking.  Medication refills:  NA. Labs with PCP. I suggested that she contact her PCP to discuss potential increase in dosage of her Prozac.  Follow up:  yearly and prn.

## 2023-08-10 ENCOUNTER — Ambulatory Visit: Payer: 59 | Admitting: Obstetrics and Gynecology

## 2023-08-22 ENCOUNTER — Ambulatory Visit (INDEPENDENT_AMBULATORY_CARE_PROVIDER_SITE_OTHER): Payer: 59 | Admitting: Obstetrics and Gynecology

## 2023-08-22 ENCOUNTER — Encounter: Payer: Self-pay | Admitting: Obstetrics and Gynecology

## 2023-08-22 VITALS — BP 136/82 | Ht 64.5 in | Wt 265.0 lb

## 2023-08-22 DIAGNOSIS — Z01419 Encounter for gynecological examination (general) (routine) without abnormal findings: Secondary | ICD-10-CM | POA: Diagnosis not present

## 2023-08-22 DIAGNOSIS — Z1331 Encounter for screening for depression: Secondary | ICD-10-CM | POA: Diagnosis not present

## 2023-08-22 NOTE — Patient Instructions (Signed)

## 2023-12-20 ENCOUNTER — Ambulatory Visit: Payer: Self-pay

## 2023-12-20 NOTE — Telephone Encounter (Signed)
 FYI Only or Action Required?: FYI only for provider.  Patient calling for new patient appointment with pulmonary. Called Nurse Triage reporting Shortness of Breath. Symptoms began several months ago. Interventions attempted: Rescue inhaler and Increased fluids/rest. Symptoms are: unchanged.  Triage Disposition: Go to ED Now (Notify PCP)  Patient/caregiver understands and will follow disposition?: Yes  Copied from CRM [id number]. Topic: Clinical - Red Word Triage >> Dec 20, 2023  4:44 PM Celestine FALCON wrote: Red Word that prompted transfer to Nurse Triage: Pt is experiencing SOB (when going up steps or doing any physical activity), difficulty breathing, and chest pressure for about 5 weeks. Pt was told by Dr. Willian she had a blockage in her left lung; been on antibiotics to help clear, but hasn't felt much better. Referral is in the pt's chart to see Dr. Meade for J45.20 (ICD-10-CM) - Mild intermittent asthma, uncomplicated G47.33 (ICD-10-CM) - Obstructive sleep apnea (adult). Reason for Disposition  [1] MODERATE difficulty breathing (e.g., speaks in phrases, SOB even at rest, pulse 100-120) AND [2] NEW-onset or WORSE than normal  Answer Assessment - Initial Assessment Questions 1. RESPIRATORY STATUS: Describe your breathing? (e.g., wheezing, shortness of breath, unable to speak, severe coughing)      Shortness of breath, chest pressure 2. ONSET: When did this breathing problem begin?      Going on for several weeks-patient was diagnosed COVID earlier this year 3. PATTERN Does the difficult breathing come and go, or has it been constant since it started?      constant 4. SEVERITY: How bad is your breathing? (e.g., mild, moderate, severe)    - MILD: No SOB at rest, mild SOB with walking, speaks normally in sentences, can lie down, no retractions, pulse < 100.    - MODERATE: SOB at rest, SOB with minimal exertion and prefers to sit, cannot lie down flat, speaks in phrases, mild retractions,  audible wheezing, pulse 100-120.    - SEVERE: Very SOB at rest, speaks in single words, struggling to breathe, sitting hunched forward, retractions, pulse > 120      Mild-Moderate 5. RECURRENT SYMPTOM: Have you had difficulty breathing before? If Yes, ask: When was the last time? and What happened that time?      yes 6. CARDIAC HISTORY: Do you have any history of heart disease? (e.g., heart attack, angina, bypass surgery, angioplasty)      High cholesterol 7. LUNG HISTORY: Do you have any history of lung disease?  (e.g., pulmonary embolus, asthma, emphysema)     COVID, asthma, sleep apnea 8. CAUSE: What do you think is causing the breathing problem?      Patient was told that she has a blockage to her left lung 9. OTHER SYMPTOMS: Do you have any other symptoms? (e.g., dizziness, runny nose, cough, chest pain, fever)     Chest pressure, cough, runny nose, lightheaded 10. O2 SATURATION MONITOR:  Do you use an oxygen saturation monitor (pulse oximeter) at home? If Yes, ask: What is your reading (oxygen level) today? What is your usual oxygen saturation reading? (e.g., 95%)       N/A 12. TRAVEL: Have you traveled out of the country in the last month? (e.g., travel history, exposures)       No  Patient calling to make new patient appointment with Pulmonary office. Patient instructed to be seen in Emergency Department due to the wait time for a new patient appointment. Patient transferred to agent to be scheduled for new patient appointment.  Protocols used:  Breathing Difficulty-A-AH

## 2024-02-28 ENCOUNTER — Encounter: Payer: Self-pay | Admitting: Internal Medicine

## 2024-02-28 ENCOUNTER — Ambulatory Visit: Admitting: Internal Medicine

## 2024-02-28 VITALS — BP 146/81 | HR 97 | Temp 98.7°F | Ht 64.0 in | Wt 271.4 lb

## 2024-02-28 DIAGNOSIS — G4733 Obstructive sleep apnea (adult) (pediatric): Secondary | ICD-10-CM | POA: Diagnosis not present

## 2024-02-28 DIAGNOSIS — J4541 Moderate persistent asthma with (acute) exacerbation: Secondary | ICD-10-CM | POA: Diagnosis not present

## 2024-02-28 DIAGNOSIS — J309 Allergic rhinitis, unspecified: Secondary | ICD-10-CM | POA: Diagnosis not present

## 2024-02-28 LAB — POCT EXHALED NITRIC OXIDE: FeNO level (ppb): 50

## 2024-02-28 MED ORDER — BREO ELLIPTA 100-25 MCG/ACT IN AEPB: 1.0000 | INHALATION_SPRAY | Freq: Every day | RESPIRATORY_TRACT | 5 refills | Status: DC

## 2024-02-28 MED ORDER — LEVOCETIRIZINE DIHYDROCHLORIDE 5 MG PO TABS: 5.0000 mg | ORAL_TABLET | Freq: Every evening | ORAL | 5 refills | Status: AC

## 2024-02-28 MED ORDER — FLUTICASONE PROPIONATE 50 MCG/ACT NA SUSP: 1.0000 | Freq: Every day | NASAL | 5 refills | Status: AC

## 2024-02-28 MED ORDER — PREDNISONE 10 MG PO TABS: ORAL_TABLET | ORAL | 0 refills | Status: AC

## 2024-02-28 NOTE — Progress Notes (Signed)
 Marissa [name]    [id number]    11/15/1960  Primary Care Physician:Boals, Beverley, MD  Referring Physician: Willian Beverley, MD [address] [place],  [place] [address] Reason for Consultation: shortness of breath and cough Date of Consultation: 02/28/2024  Chief complaint:   Chief Complaint  Patient presents with   Consult    Breathing difficulties and a cough      HPI:  Discussed the use of AI scribe software for clinical note transcription with the patient, who gave verbal consent to proceed.  History of Present Illness [name] is a 63 year old female with exercise-induced asthma who presents with persistent cough and shortness of breath.  She developed a persistent cough after taking Theraflu at the end of last year or the beginning of this year. Subsequently, she contracted COVID-19 in March, which resolved, but the cough persisted. Her breathing has become increasingly labored over time.  During an annual physical, a chest x-ray revealed abnormalities in the lower part of her lungs, leading to a course of antibiotics, which did not improve her symptoms. She later visited the ER, where another chest x-ray showed worsening of the condition. A CT scan was performed following a consultation with Dr. Elnor, who also conducted blood work.  She has been prescribed antibiotics twice and possibly steroids once, but she reports no improvement with these treatments. She has a history of exercise-induced asthma, for which she has used an albuterol  inhaler, although it does not alleviate her symptoms. She was prescribed a nebulizer in the ER, which she finds more effective than inhalers, but it does not provide significant relief.  She experiences wheezing that comes and goes, and she is currently having a relatively good morning. She has a history of allergies and exercise-induced asthma but no childhood asthma. She has not been diagnosed with diabetes but has  experienced high blood pressure recently, which she attributes to stress.  She reports nasal congestion, a runny nose, and occasional headaches, which are new for her. No current reflux or heartburn issues. She has not found any treatments effective over the past several months.  Her mother is a lifelong smoker with breathing issues, and she was exposed to secondhand smoke growing up. 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 She does not smoke and has no pets.   Social history:  Occupation: retired Emergency planning/management officer no pets Exposures: lives at home with fiance Smoking history: passive smoke exposure in childhood, never active smoker.   Social History   Occupational History   Not on file  Tobacco Use   Smoking status: Never    Passive exposure: Past   Smokeless tobacco: Never  Vaping Use   Vaping status: Never Used  Substance and Sexual Activity   Alcohol use: Yes    Alcohol/week: 4.0 standard drinks of alcohol    Types: 4 Standard drinks or equivalent per week    Comment: weekly   Drug use: No   Sexual activity: Not Currently    Partners: Male    Comment: Hysterectomy    Relevant family history:  Family History  Problem Relation Age of Onset   Diabetes Mother    Hyperlipidemia Mother    Hypertension Mother    Ulcers Mother    Fibromyalgia Mother    COPD Mother    Hyperlipidemia Father    Seizures Father    Hypertension Father    Dementia Father  Atrial fibrillation Father    Hyperlipidemia Brother    Cancer Maternal Grandmother    Sudden death Maternal Grandfather    Cancer Paternal Grandmother        breast/liver   Breast cancer Paternal Grandmother    Heart attack Neg Hx     Past Medical History:  Diagnosis Date   Abnormal uterine bleeding    due to fibroids   Anxiety    Arthritis    knee, scheduled for knee replacement 04/2016   Asthma    exercise induced-no inhalers   Complication of anesthesia    COVID-19 09/2020    Dysmenorrhea    2000  from fibroids   Family history of adverse reaction to anesthesia 02/26/2016   father had shoulder replacement-admitted back to hospital for related complications   Fibroid    Genital warts 1994   Tx  x2   GERD (gastroesophageal reflux disease)    Head injury with loss of consciousness (HCC) 1986   Hyperlipidemia    PONV (postoperative nausea and vomiting)    Primary localized osteoarthrosis of the knee, right    Sleep apnea    has a cpap-cannot use-makes her sick   Unilateral post-traumatic osteoarthritis, right knee 04/21/2016    Past Surgical History:  Procedure Laterality Date   ABDOMINAL HYSTERECTOMY  09/11/2002   ovaries retained. 2o fibroids   APPENDECTOMY  1982   BREAST EXCISIONAL BIOPSY Left 09/1995   BREAST SURGERY  1997   cyst left breast rem. benign   COLONOSCOPY  06/2015   polyp recheck 5 yrs   EXCISION VAGINAL CYST N/A 03/02/2016   Procedure: excision of vaginal polyp;  Surgeon: Bobie FORBES Cathlyn JAYSON Nikki, MD;  Location: [facility];  Service: Gynecology;  Laterality: N/A;   KNEE ARTHROSCOPY Right 07/16/2013   Procedure: RIGHT ARTHROSCOPY KNEE, partial lateral menisectomy and chrondromalasia, with microfracture of lateral femeral chondyl, and excision of plica ;  Surgeon: Dempsey JINNY Sensor, MD;  Location: [facility];  Service: Orthopedics;  Laterality: Right;   KNEE ARTHROSCOPY Right 07/05/2014   LIPOMA EXCISION     RECONSTRUCTION OF NOSE  1986   SHOULDER ARTHROSCOPY WITH ROTATOR CUFF REPAIR AND SUBACROMIAL DECOMPRESSION Left 08/02/2014   Procedure: SHOULDER ARTHROSCOPY WITH ROTATOR CUFF REPAIR AND SUBACROMIAL DECOMPRESSION;  Surgeon: Maude KANDICE Herald, MD;  Location: [facility];  Service: Orthopedics;  Laterality: Left;   SHOULDER SURGERY Right 2003   rt   STERIOD INJECTION Left 08/02/2014   Procedure: LEFT THUMB INJECTION;  Surgeon: Maude KANDICE Herald, MD;  Location: [facility];  Service: Orthopedics;   Laterality: Left;   thumb surgery Bilateral 2024   TOTAL KNEE ARTHROPLASTY Right 05/03/2016   Procedure: RIGHT TOTAL KNEE ARTHROPLASTY;  Surgeon: Lamar Millman, MD;  Location: [facility];  Service: Orthopedics;  Laterality: Right;   TOTAL KNEE REVISION Right 08/02/2018   Procedure: Right total knee arthroplasty poly revision;  Surgeon: Melodi Dempsey, MD;  Location: [facility];  Service: Orthopedics;  Laterality: Right;      Physical Exam: Blood pressure (!) 146/81, pulse 97, temperature 98.7 F (37.1 C), temperature source Temporal, height 5' 4 (1.626 m), weight 271 lb 6.4 oz (123.1 kg), last menstrual period 08/20/2002, SpO2 96%. Gen:      No acute distress, voice with nasal congestion ENT:  +cobblestoning, no nasal polyps, mucus membranes moist Lungs:    No increased respiratory effort, symmetric chest wall excursion, clear to auscultation bilaterally, no wheezes or crackles, frequent cough and  throat clearing CV:         Regular rate and rhythm; no murmurs, rubs, or gallops.  No pedal edema Abd:      + bowel sounds; soft, non-tender; no distension MSK: no acute synovitis of DIP or PIP joints, no mechanics hands.  Skin:      Warm and dry; no rashes Neuro: normal speech, no focal facial asymmetry Psych: alert and oriented x3, normal mood and affect   Data Reviewed/Medical Decision Making:  Independent interpretation of tests: Imaging:  Review of patient's chest xray Jan 2024 images revealed no acute process. The patient's images have been independently reviewed by me.   CT Chet August 2025 done at [facility] - no acute process, sub 4mm pulmonary nodules.  PFTs:  Labs:  Lab Results  Component Value Date   NA 138 08/03/2018   K 4.8 08/03/2018   CO2 24 08/03/2018   GLUCOSE 144 (H) 08/03/2018   BUN 13 08/03/2018   CREATININE 0.88 08/03/2018   CALCIUM 9.1 08/03/2018   GFRNONAA >60 08/03/2018   Lab Results  Component Value Date   WBC 16.1 (H) 08/03/2018   HGB 12.7 08/03/2018    HCT 38.5 08/03/2018   MCV 99.2 08/03/2018   PLT 302 08/03/2018     Immunization status:  Immunization History  Administered Date(s) Administered   Influenza Split 03/22/2011   Influenza Whole 04/21/2010   Influenza, Seasonal, Injecte, Preservative Fre 04/04/2016, 04/18/2018   Influenza,Quad,Nasal, Live 03/22/2011   Influenza,inj,Quad PF,6+ Mos 04/18/2018   Influenza,inj,quad, With Preservative 03/21/2018   PFIZER(Purple Top)SARS-COV-2 Vaccination 02/06/2020, 02/27/2020   Pneumococcal Polysaccharide-23 12/07/2016   Tdap 07/22/2007, 03/06/2018     I reviewed prior external note(s) from pcp, pulmonary  I reviewed the result(s) of the labs and imaging as noted above.   I have ordered feno   Assessment and Plan Assessment & Plan Uncontrolled asthma with persistent cough and shortness of breath Chronic asthma exacerbation likely due to secondhand smoke, allergies, and recent viral infection. Previous treatments ineffective, indicating significant airway inflammation. - Prescribe prednisone  taper for acute symptom relief. - Initiate Breo inhaler, one puff daily, as maintenance therapy. - Continue albuterol  nebulizer as needed, up to six times daily for rescue therapy. - Feno obtained today 50 ppb which is elevated and consistent with allergic inflammation  Allergic rhinitis with postnasal drainage Allergic rhinitis with postnasal drainage contributing to cough. Allergies and secondhand smoke exposure likely contributing factors. - Prescribe Flonase  nasal spray, one spray each nostril twice daily for one week, then reduce to once daily. - Prescribe levocetirizine (Xyzal ) once daily at night for antihistamine therapy.  Benign pulmonary nodules CT scan shows two to three millimeter nodules in both lungs. Non-smoking status and lack of personal cancer history indicate benign nature, no follow-up required.  Follow-Up Follow-up to assess response to treatment and ensure readiness for  upcoming travel. - Schedule follow-up appointment in six weeks, after return from [place] , to assess treatment efficacy and prepare for scuba diving trip.     Return to Care: Return in about 6 weeks (around 04/10/2024).  Verdon Gore, MD Pulmonary and Critical Care Medicine [facility] Office:[phone]  CC: Willian Righter, MD

## 2024-02-28 NOTE — Patient Instructions (Addendum)
 It was a pleasure to see you today!  Please schedule follow up with myself in 6 weeks.  If my schedule is not open yet, we will contact you with a reminder closer to that time. Please call [phone] if you haven't heard from us  a month before, and always call us  sooner if issues or concerns arise. You can also send us  a message through MyChart, but but aware that this is not to be used for urgent issues and it may take up to 5-7 days to receive a reply. Please be aware that you will likely be able to view your results before I have a chance to respond to them. Please give us  5 business days to respond to any non-urgent results.   VISIT SUMMARY:  During your visit, we discussed your persistent cough and shortness of breath, which have not improved with previous treatments. We reviewed your recent medical history, including your experience with COVID-19, and the results of your chest x-rays and CT scan. We also discussed your history of exercise-induced asthma and allergies.  YOUR PLAN:  -UNCONTROLLED ASTHMA WITH PERSISTENT COUGH AND SHORTNESS OF BREATH: Your asthma symptoms, including persistent cough and shortness of breath, are not well controlled, likely due to secondhand smoke, allergies, and a recent viral infection. We will start you on a prednisone  taper for acute relief and a Breo inhaler for daily maintenance. Continue using your albuterol  nebulizer as needed, up to six times daily.  -ALLERGIC RHINITIS WITH POSTNASAL DRAINAGE: Your nasal congestion and runny nose are likely due to allergies and are contributing to your cough. We will start you on Flonase  nasal spray and levocetirizine (Xyzal ) to help manage your allergy  symptoms.  -BENIGN PULMONARY NODULES: The CT scan showed small nodules in your lungs, which are not a cause for concern given your non-smoking status and lack of personal cancer history. No further follow-up is needed for these nodules.  Flonase  - 1 spray on each side of  your nose twice a day for first week, then 1 spray on each side.   Instructions for use: If you also use a saline nasal spray or rinse, use that first. Position the head with the chin slightly tucked. Use the right hand to spray into the left nostril and the right hand to spray into the left nostril.   Point the bottle away from the septum of your nose (cartilage that divides the two sides of your nose).  Hold the nostril closed on the opposite side from where you will spray Spray once and gently sniff to pull the medicine into the higher parts of your nose.  Don't sniff too hard as the medicine will drain down the back of your throat instead. Repeat with a second spray on the same side if prescribed. Repeat on the other side of your nose.

## 2024-04-11 ENCOUNTER — Telehealth: Payer: Self-pay

## 2024-04-11 NOTE — Telephone Encounter (Signed)
 I called [employer] from [facility] and spoke to Babbitt. Chelsea stated that the pt's account is inactive due to pt missing clinic. Chelsea stated I could also try to [facility] office and see if they can pull anything.   I called and spoke to pt. Pt states she is not using her machine and has not in a very long time. Pt states this is due to her constantly getting bronchitis at the time. NFN

## 2024-04-12 ENCOUNTER — Ambulatory Visit: Admitting: Primary Care

## 2024-04-12 ENCOUNTER — Encounter: Payer: Self-pay | Admitting: Primary Care

## 2024-04-12 VITALS — BP 159/83 | HR 74 | Temp 97.7°F | Ht 64.0 in | Wt 272.6 lb

## 2024-04-12 DIAGNOSIS — J301 Allergic rhinitis due to pollen: Secondary | ICD-10-CM

## 2024-04-12 DIAGNOSIS — J45909 Unspecified asthma, uncomplicated: Secondary | ICD-10-CM

## 2024-04-12 DIAGNOSIS — R0982 Postnasal drip: Secondary | ICD-10-CM

## 2024-04-12 DIAGNOSIS — R051 Acute cough: Secondary | ICD-10-CM

## 2024-04-12 DIAGNOSIS — G4733 Obstructive sleep apnea (adult) (pediatric): Secondary | ICD-10-CM

## 2024-04-12 LAB — NITRIC OXIDE: Nitric Oxide: 20

## 2024-04-12 MED ORDER — AZELASTINE HCL 0.1 % NA SOLN
1.0000 | Freq: Two times a day (BID) | NASAL | 1 refills | Status: AC
Start: 2024-04-12 — End: ?

## 2024-04-12 MED ORDER — PROMETHAZINE-DM 6.25-15 MG/5ML PO SYRP: 5.0000 mL | ORAL_SOLUTION | Freq: Every evening | ORAL | 0 refills | Status: DC | PRN

## 2024-04-12 NOTE — Progress Notes (Signed)
 @Patient  ID: Marissa [name], female    DOB: 1961/05/28, 63 y.o.   MRN: [id number]  Chief Complaint  Patient presents with   Medical Management of Chronic Issues    Pt states she has not treated her OSA in many years to to constant bronchitis. Pt would like to discuss possible treatment options for OSA and why she may be constantly getting bronchitis     Referring provider: Willian Righter, MD  HPI: 63 year old female, never smoker. PMH significant for asthma, persistent cough, allergic rhinitis.   04/12/2024 Discussed the use of AI scribe software for clinical note transcription with the patient, who gave verbal consent to proceed.  History of Present Illness Marissa [name] is a 63 year old female with chronic asthma who presents for a follow-up regarding persistent cough and shortness of breath.  She has a history of exercise-induced asthma, which has been poorly controlled. She has a history of exposure to secondhand smoke, allergies, and a previous viral infection. She was previously treated with prednisone  and started on Breo in September. She previously had elevated FeNO levels.  Her symptoms began either at the end of last year or the beginning of this year with a cold, but the cough never resolved. In March, she contracted COVID-19, which exacerbated her symptoms. Although she recovered from COVID-19, the cough persisted, and she experienced chest pressure. She notes that her symptoms improved while in [place] , but worsened upon returning home.  Currently, she experiences significant postnasal drainage, describing it as 'like [facility] down the back of my throat.' She is not taking Xyzal  but uses Flonase  nasal spray. She reports that the Breo inhaler has not significantly helped her symptoms, and she does not find her rescue inhaler effective. Her main complaint is a persistent cough and drainage, without current wheezing or chest tightness, although she has experienced  these in the past.  Regarding her sleep apnea, she initially used a CPAP machine but discontinued it due to recurrent sinus infections and bronchitis. She switched to a snore guard, which has helped some but caused issues with her bite. She has not had a recent sleep study and is unsure of her current weight compared to her last study. She continues to experience symptoms such as snoring.    Allergies  Allergen Reactions   Statins Itching, Rash and Other (See Comments)    Muscle and joint    Immunization History  Administered Date(s) Administered   Influenza Split 03/22/2011   Influenza Whole 04/21/2010   Influenza, Seasonal, Injecte, Preservative Fre 04/04/2016, 04/18/2018   Influenza,Quad,Nasal, Live 03/22/2011   Influenza,inj,Quad PF,6+ Mos 04/18/2018   Influenza,inj,quad, With Preservative 03/21/2018   PFIZER(Purple Top)SARS-COV-2 Vaccination 02/06/2020, 02/27/2020   Pneumococcal Polysaccharide-23 12/07/2016   Tdap 07/22/2007, 03/06/2018    Past Medical History:  Diagnosis Date   Abnormal uterine bleeding    due to fibroids   Anxiety    Arthritis    knee, scheduled for knee replacement 04/2016   Asthma    exercise induced-no inhalers   Complication of anesthesia    COVID-19 09/2020   Dysmenorrhea    2000  from fibroids   Family history of adverse reaction to anesthesia 02/26/2016   father had shoulder replacement-admitted back to hospital for related complications   Fibroid    Genital warts 1994   Tx  x2   GERD (gastroesophageal reflux disease)    Head injury with loss of consciousness (HCC) 1986   Hyperlipidemia  PONV (postoperative nausea and vomiting)    Primary localized osteoarthrosis of the knee, right    Sleep apnea    has a cpap-cannot use-makes her sick   Unilateral post-traumatic osteoarthritis, right knee 04/21/2016    Tobacco History: Social History   Tobacco Use  Smoking Status Never   Passive exposure: Past  Smokeless Tobacco Never    Counseling given: Not Answered   Outpatient Medications Prior to Visit  Medication Sig Dispense Refill   albuterol  (VENTOLIN  HFA) 108 (90 Base) MCG/ACT inhaler Inhale 2 puffs into the lungs every 6 (six) hours as needed for wheezing or shortness of breath (Cough). 54 g 2   Cholecalciferol (VITAMIN D -3 PO) Take by mouth.     FLUoxetine (PROZAC) 20 MG capsule Take 20 mg by mouth daily.     fluticasone  (FLONASE ) 50 MCG/ACT nasal spray Place 1 spray into both nostrils daily. 1 g 5   fluticasone  furoate-vilanterol (BREO ELLIPTA ) 100-25 MCG/ACT AEPB Inhale 1 puff into the lungs daily. 60 each 5   guaifenesin  (HUMIBID E) 400 MG TABS tablet Take 1 tablet 3 times daily as needed for chest congestion and cough 30 tablet 0   levocetirizine (XYZAL ) 5 MG tablet Take 1 tablet (5 mg total) by mouth every evening. 30 tablet 5   Multiple Vitamins-Minerals (MULTIVITAMIN WOMEN PO) Take 1 tablet by mouth daily.     promethazine -dextromethorphan (PROMETHAZINE -DM) 6.25-15 MG/5ML syrup Take 5 mLs by mouth at bedtime as needed for cough. 60 mL 0   No facility-administered medications prior to visit.   Review of Systems  Review of Systems  Constitutional: Negative.   HENT:  Positive for postnasal drip.   Respiratory:  Positive for cough. Negative for chest tightness, shortness of breath and wheezing.   Cardiovascular: Negative.    Physical Exam  LMP 08/20/2002  Physical Exam Constitutional:      Appearance: Normal appearance. She is well-developed.  HENT:     Head: Normocephalic and atraumatic.     Mouth/Throat:     Mouth: Mucous membranes are moist.     Pharynx: Oropharynx is clear.  Eyes:     Pupils: Pupils are equal, round, and reactive to light.  Cardiovascular:     Rate and Rhythm: Normal rate and regular rhythm.     Heart sounds: Normal heart sounds. No murmur heard. Pulmonary:     Effort: Pulmonary effort is normal. No respiratory distress.     Breath sounds: Normal breath sounds. No  wheezing or rhonchi.  Musculoskeletal:        General: Normal range of motion.     Cervical back: Normal range of motion and neck supple.  Skin:    General: Skin is warm and dry.     Findings: No erythema or rash.  Neurological:     General: No focal deficit present.     Mental Status: She is alert and oriented to person, place, and time. Mental status is at baseline.  Psychiatric:        Mood and Affect: Mood normal.        Behavior: Behavior normal.        Thought Content: Thought content normal.        Judgment: Judgment normal.      Lab Results:  CBC    Component Value Date/Time   WBC 16.1 (H) 08/03/2018 0522   RBC 3.88 08/03/2018 0522   HGB 12.7 08/03/2018 0522   HGB 13.9 10/05/2012 1502   HCT 38.5 08/03/2018 0522  PLT 302 08/03/2018 0522   MCV 99.2 08/03/2018 0522   MCH 32.7 08/03/2018 0522   MCHC 33.0 08/03/2018 0522   RDW 12.0 08/03/2018 0522   LYMPHSABS 3.2 04/23/2016 1208   MONOABS 0.7 04/23/2016 1208   EOSABS 0.2 04/23/2016 1208   BASOSABS 0.1 04/23/2016 1208    BMET    Component Value Date/Time   NA 138 08/03/2018 0522   K 4.8 08/03/2018 0522   CL 106 08/03/2018 0522   CO2 24 08/03/2018 0522   GLUCOSE 144 (H) 08/03/2018 0522   BUN 13 08/03/2018 0522   CREATININE 0.88 08/03/2018 0522   CREATININE 1.01 12/24/2015 1437   CALCIUM 9.1 08/03/2018 0522   GFRNONAA >60 08/03/2018 0522   GFRAA >60 08/03/2018 0522    BNP No results found for: BNP  ProBNP No results found for: PROBNP  Imaging: No results found.   Assessment & Plan:   1. Intrinsic asthma, unspecified asthma severity, unspecified whether complicated, unspecified whether persistent (Primary)  2. Obstructive sleep apnea  Assessment and Plan Assessment & Plan Asthma with allergic rhinitis and postnasal drip Chronic asthma with persistent cough and dyspnea, likely exacerbated by secondhand smoke, allergies, and previous viral infection. Symptoms include cough, chest pressure,  and postnasal drip. Previous treatment with prednisone  and Breo. Elevated FENO suggests an allergic component. Symptoms improved in drier air. Current symptoms include significant postnasal drainage and cough, without wheezing or chest tightness. Upper airway drainage likely contributing to symptoms. - Order allergy  testing for common respiratory allergens in the region. - Increase Breo dosage if FENO remains elevated. - Prescribe Astelin nasal spray twice daily. - Continue Flonase  nasal spray once daily. - Recommend starting Xyzal  or Allegra , an oral antihistamine. - Prescribe promethazine  DM for cough, up to four times daily, preferably at night. - Consider montelukast if allergy  testing indicates significant allergies. - Repeat FENO test to assess response to [facility].  Obstructive sleep apnea Obstructive sleep apnea with previous CPAP use discontinued due to recurrent sinus infections and bronchitis. Current use of a snore guard causing bite issues. Symptoms include snoring. Last sleep study over ten years ago. Potential treatment options include weight loss, oral appliance, retrying CPAP, or ENT surgical options. Inspire not an option due to BMI over 40. - Order repeat sleep study to assess current severity of sleep apnea. - Inquire with insurance about coverage for Zepbound for weight loss in the context of sleep apnea and obesity. - Consider retrying CPAP with new unit and proper maintenance if sleep study indicates need. - Explore orthodontic dental options for oral appliance.   Almarie LELON Ferrari, NP 04/12/2024

## 2024-04-12 NOTE — Patient Instructions (Addendum)
  VISIT SUMMARY: Today, you were seen for a follow-up regarding your persistent cough and shortness of breath related to your chronic asthma. We discussed your symptoms, including postnasal drainage and the effectiveness of your current medications. We also reviewed your history of sleep apnea and the issues you've had with CPAP and snore guard use.  YOUR PLAN: -ASTHMA WITH ALLERGIC RHINITIS AND POSTNASAL DRIP: Your chronic asthma, which includes symptoms like persistent cough and shortness of breath, is likely worsened by secondhand smoke, allergies, and past viral infections. We will order allergy  testing to identify specific allergens. If your FeNO levels remain high, we will increase your Breo dosage. You should continue using Flonase  once daily and start using Astelin nasal spray twice daily. Additionally, you should begin taking an oral antihistamine like Xyzal  or Allegra . For your cough, you can take promethazine  DM up to four times daily, preferably at night. If allergy  testing shows significant allergies, we may consider adding montelukast. We will repeat the FeNO test to see how well Ranell is working for you.  -OBSTRUCTIVE SLEEP APNEA: Obstructive sleep apnea is a condition where your breathing stops and starts during sleep, often causing snoring and poor sleep quality. Since you had issues with CPAP and the snore guard, we will order a repeat sleep study to understand the current severity of your sleep apnea. We will also check with your insurance about coverage for Zepbound, a weight loss medication that could help with your sleep apnea. Depending on the sleep study results, we may consider retrying CPAP with a new unit and proper maintenance. We will also explore orthodontic dental options for an oral appliance to help manage your sleep apnea.  INSTRUCTIONS: Please follow up for the allergy  testing and repeat sleep study as ordered. Continue with your current medications and start the new ones  as discussed. If you have any questions or concerns, do not hesitate to contact our office.  Follow-up Needs to establish with new pulmonary/sleep MD in 3 months

## 2024-04-13 LAB — RESPIRATORY ALLERGY PANEL REGION II W/ RFLX: ~~LOC~~
Allergen, A. alternata, m6: 0.57 kU/L — ABNORMAL HIGH
Allergen, Cedar tree, t12: <0.1 kU/L
Allergen, Comm Silver Birch, t9: <0.1 kU/L
Allergen, Cottonwood, t14: 0.12 kU/L — ABNORMAL HIGH
Allergen, D pternoyssinus,d7: <0.1 kU/L
Allergen, Mouse Urine Protein, e78: <0.1 kU/L
Allergen, Mulberry, t76: <0.1 kU/L
Allergen, Oak,t7: <0.1 kU/L
Allergen, P. notatum, m1: <0.1 kU/L
Aspergillus fumigatus, m3: <0.1 kU/L
Bermuda Grass: <0.1 kU/L
Box Elder IgE: <0.1 kU/L
CLADOSPORIUM HERBARUM (M2) IGE: <0.1 kU/L
COMMON RAGWEED (SHORT) (W1) IGE: <0.1 kU/L
Cat Dander: <0.1 kU/L
Class: 0
Class: 0
Class: 0
Class: 0
Class: 0
Class: 0
Class: 0
Class: 0
Class: 0
Class: 0
Class: 0
Class: 0
Class: 0
Class: 0
Class: 0
Class: 0
Class: 0
Class: 0
Class: 0
Class: 0
Class: 0
Class: 1
Cockroach: 0.1 kU/L — ABNORMAL HIGH
D. farinae: <0.1 kU/L
Dog Dander: <0.1 kU/L
Elm IgE: <0.1 kU/L
IgE (Immunoglobulin E), Serum: 64 kU/L (ref <=114)
Johnson Grass: <0.1 kU/L
Pecan/Hickory Tree IgE: <0.1 kU/L
Rough Pigweed  IgE: <0.1 kU/L
Sheep Sorrel IgE: <0.1 kU/L
Timothy Grass: <0.1 kU/L

## 2024-04-13 LAB — INTERPRETATION:

## 2024-05-04 ENCOUNTER — Telehealth: Payer: Self-pay

## 2024-05-04 NOTE — Telephone Encounter (Signed)
 Copied from CRM #[id number]. Topic: Clinical - Medication Question >> May 04, 2024 12:33 PM Rozanna G wrote: Reason for CRM: pt wants to know if she needs to use her snore guard during the sleep study she stated its ok to communicate via my chart if possible.   Spoke with patient VBU.    - NFN

## 2024-05-10 ENCOUNTER — Encounter

## 2024-05-10 DIAGNOSIS — J45909 Unspecified asthma, uncomplicated: Secondary | ICD-10-CM

## 2024-05-10 DIAGNOSIS — J301 Allergic rhinitis due to pollen: Secondary | ICD-10-CM

## 2024-05-18 DIAGNOSIS — R069 Unspecified abnormalities of breathing: Secondary | ICD-10-CM | POA: Diagnosis not present

## 2024-05-23 ENCOUNTER — Telehealth: Payer: Self-pay

## 2024-05-23 NOTE — Telephone Encounter (Signed)
 Copied from CRM #[id number]. Topic: Clinical - Request for Lab/Test Order >> May 23, 2024 11:54 AM [name] wrote: Reason for CRM: Pt received a phone call at her mom's phone number to advise pt has an appt for a sleep study, pt stated they left the incorrect name of Marissa [name] and the phone number of [phone] they called from is disconnected\ and she wanted to f/u and make the office aware and inquire if the call was legitimate.   I called and spoke to pt. Pt states she completed her HST a couple weeks ago and was not sure if she was suppose to complete another one. I informed pt that I would send a message to our pcc group to see if they have received her HST results and we would follow up then. Pt verbalized understanding. PCC's, please advise

## 2024-05-29 ENCOUNTER — Ambulatory Visit: Payer: Self-pay | Admitting: Primary Care

## 2024-05-29 NOTE — Progress Notes (Signed)
 Sleep study showed moderate OSA, average 25 apneas per hour. Please set up virtual visit to discuss treatment

## 2024-06-06 NOTE — Progress Notes (Signed)
 Before I call pt, do you still want a virtual appt with you or do you want pt to keep upcomming appt with Dr Theodoro?

## 2024-06-12 NOTE — Progress Notes (Signed)
 Pt was scheduled for 06-25-2024. NFN

## 2024-06-25 ENCOUNTER — Telehealth: Admitting: Primary Care

## 2024-06-25 ENCOUNTER — Telehealth: Payer: Self-pay | Admitting: Primary Care

## 2024-06-25 DIAGNOSIS — E669 Obesity, unspecified: Secondary | ICD-10-CM | POA: Diagnosis not present

## 2024-06-25 DIAGNOSIS — J4520 Mild intermittent asthma, uncomplicated: Secondary | ICD-10-CM

## 2024-06-25 DIAGNOSIS — G4733 Obstructive sleep apnea (adult) (pediatric): Secondary | ICD-10-CM

## 2024-06-25 DIAGNOSIS — Z6841 Body Mass Index (BMI) 40.0 and over, adult: Secondary | ICD-10-CM

## 2024-06-25 MED ORDER — ZEPBOUND 2.5 MG/0.5ML ~~LOC~~ SOAJ: 2.5000 mg | SUBCUTANEOUS | 1 refills | Status: AC

## 2024-06-25 NOTE — Progress Notes (Signed)
 Virtual Visit via Video Note  I connected with SOLANA COGGIN on 06/25/2024 at  1:00 PM EST by a video enabled telemedicine application and verified that I am speaking with the correct person using two identifiers.  Location: Patient: Home Provider: Office   I discussed the limitations of evaluation and management by telemedicine and the availability of in person appointments. The patient expressed understanding and agreed to proceed.  History of Present Illness: 64 year old female, never smoker. PMH significant for asthma, persistent cough, allergic rhinitis.   Previous [facility] pulmonary encounter: 04/12/2024 Discussed the use of AI scribe software for clinical note transcription with the patient, who gave verbal consent to proceed.  History of Present Illness Marissa [name] is a 64 year old female with chronic asthma who presents for a follow-up regarding persistent cough and shortness of breath.  She has a history of exercise-induced asthma, which has been poorly controlled. She has a history of exposure to secondhand smoke, allergies, and a previous viral infection. She was previously treated with prednisone  and started on Breo in September. She previously had elevated FeNO levels.  Her symptoms began either at the end of last year or the beginning of this year with a cold, but the cough never resolved. In March, she contracted COVID-19, which exacerbated her symptoms. Although she recovered from COVID-19, the cough persisted, and she experienced chest pressure. She notes that her symptoms improved while in [place] , but worsened upon returning home.  Currently, she experiences significant postnasal drainage, describing it as 'like [facility] down the back of my throat.' She is not taking Xyzal  but uses Flonase  nasal spray. She reports that the Breo inhaler has not significantly helped her symptoms, and she does not find her rescue inhaler effective. Her main complaint is a  persistent cough and drainage, without current wheezing or chest tightness, although she has experienced these in the past.  Regarding her sleep apnea, she initially used a CPAP machine but discontinued it due to recurrent sinus infections and bronchitis. She switched to a snore guard, which has helped some but caused issues with her bite. She has not had a recent sleep study and is unsure of her current weight compared to her last study. She continues to experience symptoms such as snoring.   06/25/2024- Interim hx  Discussed the use of AI scribe software for clinical note transcription with the patient, who gave verbal consent to proceed.  History of Present Illness Marissa [name] is a 64 year old female with sleep apnea who presents for follow-up of her condition.  She has a history of sleep apnea and previously used CPAP therapy, which she discontinued due to recurrent sinus infections and bronchitis. During her last visit in October, she was using a snoring guard, which caused bite issues, and she continued to experience snoring. Her last sleep study, conducted over ten years ago, was updated with a new study on November 28th, showing moderate sleep apnea with an average of 25.8 apneic events per hour using the 4% criteria.  Her BMI is 46. She has been informed that her BMI needs to be less than 40 to be eligible for certain treatments like Inspire.   No family history of medullary thyroid  cancer. No history of diabetes, and she is not on metformin or insulin.  Observations/Objective:  Appears well without overt respiratory symptoms  Assessment and Plan:  1. Obstructive sleep apnea (Primary)  Assessment & Plan Obstructive sleep apnea Moderate obstructive sleep  apnea with an average of 25.8 apneic events per hour using the 4% criteria. Previous CPAP use was poorly tolerated and discontinued due to recurrent sinus infections and bronchitis. Current management includes a snoring  guard, which alleviates symptoms. Discussed potential treatment options including retrying CPAP, weight loss, and referral to ENT for surgical options like Inspire. Marnae Madani is a permanent treatment option with potential complications, and she is not currently eligible due to BMI. Weight loss could take 6-12 months to achieve substantial results. If weight loss does not improve sleep apnea, Inspire may be reconsidered. - Patient would like to treat OSA primarily with weight loss  - Continue using snoring guard. - Will consider retrying CPAP if symptoms worsen. - Will consider Inspire if weight loss does not improve sleep apnea. - Will repeat sleep study in 6 months after starting Zepbound .  Obesity BMI of 46. Discussed the use of Zepbound , a GLP-1/GIP receptor agonist, for weight loss and management of moderate obstructive sleep apnea. Zepbound  is FDA approved for these conditions, but insurance coverage may be spotty. Discussed potential side effects including nausea, vomiting, diarrhea, and constipation. Emphasized the importance of dietary modifications to manage side effects. Insurance coverage may improve in the spring due to changes in Medicare or Medicaid policies. No history of thyroid  cancer or diabetes.  - Prescribed Zepbound  starting at 2.5 mg subcutaneously once a week, with dose titration based on tolerance (therapeutic dose range is between 10-15mg )  - Provided patient education on dietary modifications and managing side effects. - Will schedule follow-up in 4-6 weeks to assess tolerance and adjust dose as needed. - Will explore insurance coverage and consider self-pay option if necessary.  Patient has moderate sleep apnea related to obesity with BMI >/30, posing significant cardiovascular risks. Patient has failed traditional weight loss measures with caloric deficit and consistent exercise of 150 min/week for >/6 months. Patient will be initiated on Zepbound  (tirzepatide ) for weight  management. Zepbound  is the only pharmaceutical treatment approved for moderate-to-severe OSA in adults who are overweight (BMI >/27) or obese (BMI >/30). The patient will continue lifestyle modifications, including structured nutrition and physical activity as directed. No other GLP1 therapy will be used simultaneously at this time. The patient does not have any FDA labeled contraindications to this agent, including pregnancy, lactation, hx or family history of medullary thyroid  cancer, or multiple endocrine neoplasia type II. Side effect profile has been reviewed with patient. Aware of red flag symptoms to notify of immediately or seek emergency care, including severe nausea/vomiting, inability to pass bowels or gas, severe abdominal pain/tenderness, jaundice.      Follow Up Instructions:  4-6 weeks after starting zepbound     I discussed the assessment and treatment plan with the patient. The patient was provided an opportunity to ask questions and all were answered. The patient agreed with the plan and demonstrated an understanding of the instructions.   The patient was advised to call back or seek an in-person evaluation if the symptoms worsen or if the condition fails to improve as anticipated.  I provided 22 minutes of non-face-to-face time during this encounter.   Almarie LELON Ferrari, NP

## 2024-06-25 NOTE — Telephone Encounter (Signed)
 Patient needs follow-up in 4-6 weeks for OSA, new zepbound  start

## 2024-06-25 NOTE — Patient Instructions (Signed)
" °  VISIT SUMMARY: Today, you had a follow-up appointment to discuss your sleep apnea and weight management. We reviewed your recent sleep study and discussed treatment options for your sleep apnea and weight loss strategies.  YOUR PLAN: -OBSTRUCTIVE SLEEP APNEA: You have moderate to severe obstructive sleep apnea, which means you experience frequent interruptions in your breathing during sleep. We discussed continuing with your snoring guard, retrying CPAP if symptoms worsen, and considering surgical options like Inspire if weight loss does not improve your condition. We will repeat your sleep study in 6 months after starting Zepbound .  -OBESITY: Your BMI is 46, which classifies you as obese. We discussed using Zepbound , a medication to help with weight loss, and the importance of dietary changes to manage potential side effects. You will start with a dose of 2.5 mg once a week, and we will adjust it based on your tolerance. We will follow up in 4-6 weeks to assess your progress and explore insurance coverage options.  INSTRUCTIONS: Please continue using your snoring guard and start taking Zepbound  as prescribed. We will follow up in 4-6 weeks to check on your progress with Zepbound  and adjust the dose if needed. Additionally, we will repeat your sleep study in 6 months. If you experience any worsening of symptoms or side effects, please contact our office.  Notify your provider if you are planning to have a procedure/surgery, as this medication will need to be stopped prior.      "

## 2024-06-26 NOTE — Telephone Encounter (Signed)
 ATC x1.  LVM to return call.  When she calls back, please schedule f/u.  Thank you.

## 2024-06-28 NOTE — Telephone Encounter (Signed)
 Pt is scheduled for 07-12-2024 with Dr Theodoro. NFN

## 2024-06-29 ENCOUNTER — Telehealth: Payer: Self-pay

## 2024-06-29 ENCOUNTER — Other Ambulatory Visit (HOSPITAL_COMMUNITY): Payer: Self-pay

## 2024-06-29 NOTE — Telephone Encounter (Signed)
*  Pulm  Pharmacy Patient Advocate Encounter   Received notification from Fax that prior authorization for Zepbound  2.5mg  pen is required/requested.   Insurance verification completed.   The patient is insured through [facility].   Per test claim: PA required; PA submitted to above mentioned insurance via Latent Key/confirmation #/EOC [id number] Status is pending

## 2024-06-29 NOTE — Telephone Encounter (Signed)
 Your request has been approved Request Reference Number: [id number]. ZEPBOUND  INJ 2.5/0.5 is approved through 12/27/2024. Your patient may now fill this prescription and it will be covered. Authorization Expiration07/02/2025

## 2024-07-04 NOTE — Telephone Encounter (Signed)
 Contacted patient and made aware Zepbound  approved but her plan requires her to participate in plan. Advised to call and find out what she needs to do.

## 2024-07-12 ENCOUNTER — Ambulatory Visit

## 2024-07-12 VITALS — BP 136/84 | HR 72 | Temp 98.3°F | Ht 64.0 in | Wt 272.4 lb

## 2024-07-12 DIAGNOSIS — G4733 Obstructive sleep apnea (adult) (pediatric): Secondary | ICD-10-CM

## 2024-07-12 DIAGNOSIS — J301 Allergic rhinitis due to pollen: Secondary | ICD-10-CM | POA: Diagnosis not present

## 2024-07-12 DIAGNOSIS — Z6841 Body Mass Index (BMI) 40.0 and over, adult: Secondary | ICD-10-CM

## 2024-07-12 DIAGNOSIS — J4530 Mild persistent asthma, uncomplicated: Secondary | ICD-10-CM

## 2024-07-12 DIAGNOSIS — J4520 Mild intermittent asthma, uncomplicated: Secondary | ICD-10-CM

## 2024-07-12 NOTE — Progress Notes (Signed)
 "  Pulmonology Office Visit   Subjective:  Patient ID: Marissa [name], female    DOB: 1960-08-13  MRN: [id number]  Referred by: Willian Righter, MD  CC:  Chief Complaint  Patient presents with   Sleep Apnea    Uses oral appliance.  Sleeping okay.  C/o throat dryness during sleep    HPI Marissa [name] is a 64 y.o. female with exercise-induced asthma, COVID-07 September 2023.  Used to follow Dr. Meade.  Here for follow-up for asthma.  02/28/2024> initiated on Breo and gave steroid taper.  Feno 50 October 2025> Vertell Ferrari.  HST ordered. 06/25/2024> moderate OSA seen on HST.  Started on Zepbound .  Respective notes from provider reviewed as appropriate to gather relevant information for patient care.   Discussed the use of AI scribe software for clinical note transcription with the patient, who gave verbal consent to proceed.  History of Present Illness   Marissa [name] is a 64 year old female with asthma and obstructive sleep apnea who presents for follow-up.  She has a history of asthma, which worsened after a COVID-19 infection in March 2025. She was prescribed Breo in September 2025 due to persistent cough and phlegm but has not been using it consistently. Her asthma is primarily exercise-induced, with symptoms such as cough and wheezing, particularly following infections. She has not used albuterol  recently and can walk two miles without issues. She was last on prednisone  in September 2025.  She was diagnosed with obstructive sleep apnea approximately ten years ago. She has tried CPAP therapy but experienced recurrent sinus infections and bronchitis, which she attributes to CPAP use. She currently uses a snore guard provided by her dentist, although she did not use it during her sleep study. She experiences symptoms of snoring, gasping for breath, dry mouth, and occasional morning headaches. She typically goes to bed at 11 PM and wakes up around 8 AM, with occasional naps during the  day.  She has not started Zepbound , which was prescribed in January 2026. She is also not using Breo as prescribed. She stopped using Flonase  and saline spray for nasal congestion due to experiencing epistaxis.      OSA history: Dx long time ago and has used CPAP on and off. On MAD.   ASTHMA:  First diagnosed: diagnosed long time ago.  FH: brother.  Triggers: infection and exercise.  Intubated: none Last steroid use: Sept 2025.  Times albuterol  used: none.  Treatment: prn albuterol . Was taking breo in septem 2025.    Lung Health: Functional status: ET unlimited at normal pace.  Smoking: Non-smoker but had passive smoke exposure during childhood. 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  PAP download compliance data: NA.  Encore/Airview Pressure: Hours of usage: Days used >4hr: Leak: AHI:  PRIOR TESTS and IMAGING: PSG/HSAT:  HST 05/10/2024: AHI 3% 67, 4% 26.  Oximetry signal was absent.       No data to display          Allergies: Statins Current Medications[1] Past Medical History:  Diagnosis Date   Abnormal uterine bleeding    due to fibroids   Anxiety    Arthritis    knee, scheduled for knee replacement 04/2016   Asthma    exercise induced-no inhalers   Complication of anesthesia    COVID-19 09/2020   Dysmenorrhea    2000  from fibroids   Family history of adverse reaction to anesthesia 02/26/2016   father had shoulder replacement-admitted back to  hospital for related complications   Fibroid    Genital warts 1994   Tx  x2   GERD (gastroesophageal reflux disease)    Head injury with loss of consciousness (HCC) 1986   Hyperlipidemia    PONV (postoperative nausea and vomiting)    Primary localized osteoarthrosis of the knee, right    Sleep apnea    has a cpap-cannot use-makes her sick   Unilateral post-traumatic osteoarthritis, right knee 04/21/2016   Past Surgical History:  Procedure Laterality Date   ABDOMINAL HYSTERECTOMY   09/11/2002   ovaries retained. 2o fibroids   APPENDECTOMY  1982   BREAST EXCISIONAL BIOPSY Left 09/1995   BREAST SURGERY  1997   cyst left breast rem. benign   COLONOSCOPY  06/2015   polyp recheck 5 yrs   EXCISION VAGINAL CYST N/A 03/02/2016   Procedure: excision of vaginal polyp;  Surgeon: Bobie FORBES Cathlyn JAYSON Nikki, MD;  Location: [facility];  Service: Gynecology;  Laterality: N/A;   KNEE ARTHROSCOPY Right 07/16/2013   Procedure: RIGHT ARTHROSCOPY KNEE, partial lateral menisectomy and chrondromalasia, with microfracture of lateral femeral chondyl, and excision of plica ;  Surgeon: Dempsey JINNY Sensor, MD;  Location: [facility];  Service: Orthopedics;  Laterality: Right;   KNEE ARTHROSCOPY Right 07/05/2014   LIPOMA EXCISION     RECONSTRUCTION OF NOSE  1986   SHOULDER ARTHROSCOPY WITH ROTATOR CUFF REPAIR AND SUBACROMIAL DECOMPRESSION Left 08/02/2014   Procedure: SHOULDER ARTHROSCOPY WITH ROTATOR CUFF REPAIR AND SUBACROMIAL DECOMPRESSION;  Surgeon: Maude KANDICE Herald, MD;  Location: [facility];  Service: Orthopedics;  Laterality: Left;   SHOULDER SURGERY Right 2003   rt   STERIOD INJECTION Left 08/02/2014   Procedure: LEFT THUMB INJECTION;  Surgeon: Maude KANDICE Herald, MD;  Location:  SURGERY CENTER;  Service: Orthopedics;  Laterality: Left;   thumb surgery Bilateral 2024   TOTAL KNEE ARTHROPLASTY Right 05/03/2016   Procedure: RIGHT TOTAL KNEE ARTHROPLASTY;  Surgeon: Lamar Millman, MD;  [facility];  Service: Orthopedics;  Laterality: Right;   TOTAL KNEE REVISION Right 08/02/2018   Procedure: Right total knee arthroplasty poly revision;  Surgeon: Melodi Dempsey, MD;  Location: [facility];  Service: Orthopedics;  Laterality: Right;    Family History  Problem Relation Age of Onset   Diabetes Mother    Hyperlipidemia Mother    Hypertension Mother    Ulcers Mother    Fibromyalgia Mother    COPD Mother    Hyperlipidemia Father    Seizures Father     Hypertension Father    Dementia Father    Atrial fibrillation Father    Hyperlipidemia Brother    Cancer Maternal Grandmother    Sudden death Maternal Grandfather    Cancer Paternal Grandmother        breast/liver   Breast cancer Paternal Grandmother    Heart attack Neg Hx    Social History   Socioeconomic History   Marital status: Single    Spouse name: Not on file   Number of children: Not on file   Years of education: Not on file   Highest education level: Not on file  Occupational History   Not on file  Tobacco Use   Smoking status: Never    Passive exposure: Past   Smokeless tobacco: Never  Vaping Use   Vaping status: Never Used  Substance and Sexual Activity   Alcohol use: Yes    Alcohol/week: 4.0 standard drinks of alcohol    Types: 4  Standard drinks or equivalent per week    Comment: weekly   Drug use: No   Sexual activity: Not Currently    Partners: Male    Comment: Hysterectomy  Other Topics Concern   Not on file  Social History Narrative   Not on file   Social Drivers of Health   Tobacco Use: Low Risk (07/12/2024)   Patient History    Smoking Tobacco Use: Never    Smokeless Tobacco Use: Never    Passive Exposure: Past  Recent Concern: Tobacco Use - Medium Risk (05/03/2024)   Received from [facility]   Patient History    Passive Exposure: Yes    Smokeless Tobacco Use: Never    Smoking Tobacco Use: Never  Financial Resource Strain: Low Risk (11/01/2023)   Received from [facility]   Overall Financial Resource Strain (CARDIA)    Difficulty of Paying Living Expenses: Not hard at all  Food Insecurity: No Food Insecurity (11/01/2023)   Received from [facility]   Epic    Within the past 12 months, you worried that your food would run out before you got the money to buy more.: Never true    Within the past 12 months, the food you bought just didn't last and you didn't have money to get more.: Never true  Transportation Needs: No Transportation  Needs (11/01/2023)   Received from [facility] - Transportation    Lack of Transportation (Medical): No    Lack of Transportation (Non-Medical): No  Physical Activity: Not on file  Stress: Not on file  Social Connections: Not on file  Intimate Partner Violence: Not At Risk (12/22/2023)   Received from [facility]   HITS    Over the last 12 months how often did your partner physically hurt you?: Never    Over the last 12 months how often did your partner insult you or talk down to you?: Never    Over the last 12 months how often did your partner threaten you with physical harm?: Never    Over the last 12 months how often did your partner scream or curse at you?: Never  Depression (PHQ2-9): Medium Risk (08/22/2023)   Depression (PHQ2-9)    PHQ-2 Score: 10  Alcohol Screen: Not on file  Housing: Low Risk (11/01/2023)   Received from [facility]    In the last 12 months, was there a time when you were not able to pay the mortgage or rent on time?: No    In the past 12 months, how many times have you moved where you were living?: 1    At any time in the past 12 months, were you homeless or living in a shelter (including now)?: No  Utilities: Not At Risk (11/01/2023)   Received from [facility] Utilities    Threatened with loss of utilities: No  Health Literacy: Not on file       Objective:  BP 136/84   Pulse 72   Temp 98.3 F (36.8 C) (Oral)   Ht 5' 4 (1.626 m)   Wt 272 lb 6.4 oz (123.6 kg)   LMP 08/20/2002   SpO2 94% Comment: room air  BMI 46.76 kg/m  BMI Readings from Last 3 Encounters:  07/12/24 46.76 kg/m  04/12/24 46.79 kg/m  02/28/24 46.59 kg/m    Physical Exam: Physical Exam   ENT: Normal mucosa. No hypertrophy of inferior turbinates. Tonsils are normal sized. Modified Mallampati score  is 4. Oral cavity normal. PULMONARY: Lungs clear to auscultation bilaterally, no adventitious breath sounds. CARDIOVASCULAR: Regular rate and rhythm,  S1 S2 normal, no murmurs. ABDOMEN: Abdomen soft, nontender. Bowel sounds are normal. EXTREMITIES: No peripheral edema.       Diagnostic Review:  Last metabolic panel Lab Results  Component Value Date   GLUCOSE 144 (H) 08/03/2018   NA 138 08/03/2018   K 4.8 08/03/2018   CL 106 08/03/2018   CO2 24 08/03/2018   BUN 13 08/03/2018   CREATININE 0.88 08/03/2018   GFRNONAA >60 08/03/2018   CALCIUM 9.1 08/03/2018   PROT 6.8 07/26/2018   ALBUMIN 4.4 07/26/2018   BILITOT 0.5 07/26/2018   ALKPHOS 76 07/26/2018   AST 31 07/26/2018   ALT 44 07/26/2018   ANIONGAP 8 08/03/2018         Assessment & Plan:   Assessment & Plan Mild persistent asthma without complication     Allergic rhinitis due to pollen, unspecified seasonality     OSA (obstructive sleep apnea)  Orders:   Ambulatory Referral for DME  Morbid obesity (HCC)        Assessment and Plan    Obstructive sleep apnea Severe obstructive sleep apnea likely. CPAP therapy recommended. Previous CPAP use linked to sinus infections and bronchitis due to inadequate cleaning. Dental device insufficient.  - Ordered CPAP machine with hybrid mask - Instructed on proper cleaning of CPAP machine using soap and water. - Will schedule nocturnal pulse oximetry after CPAP setup to assess oxygenation. - Advised to use CPAP for at least 4 hours per night, 5 out of 7 days for 1 month within first 3 months for compliance per insurance. - Discussed SoClean machine as an alternative cleaning option, but recommended soap and water first. - Advised to discontinue use of dental device once CPAP is set up.  Mild intermittent asthma Asthma symptoms mild, primarily exercise-induced. Previous exacerbation post-COVID. Not using Breo regularly, no recent albuterol  use. Singulair discussed but she prefers to avoid medication. - restart Breo when worsening asthma symptoms. - Use albuterol  as needed for exercise-induced asthma. - Consider  Singulair if exercise-induced asthma symptoms worsen.  Morbid obesity Zepbound  prescribed but not started. Awaiting insurance approval. Weight loss important but not sufficient for sleep apnea management. - Await insurance approval for Zepbound . - Plan follow-up in one month after starting Zepbound  to assess progress and adjust dosage if needed.  Allergic rhinitis Intermittent nasal congestion and epistaxis likely due to nasal dryness. Previous use of Flonase  and azelastine . Nasal congestion may affect CPAP efficacy. - Use Flonase , two sprays in each nostril for the first seven days, then one spray daily. - Use saline spray as needed for nasal congestion. - Use azelastine  nasal spray as needed.         Notes from Dr. Meade September 2025, Vertell Ferrari October 2025 and Jan 2026 reviewed as to gather relevant information for patient care and formulating plan.  He/She was counselled about not driving while drowsy which is common side effect of sleep related disorders.   Return for 1 month after CPAP setup.   I personally spent a total of 35 minutes in the care of the patient today including preparing to see the patient, getting/reviewing separately obtained history, performing a medically appropriate exam/evaluation, counseling and educating, placing orders, documenting clinical information in the EHR, independently interpreting results, and communicating results.   Icess Bertoni, MD     [1]  Current Outpatient Medications:    albuterol  (VENTOLIN  HFA)  108 (90 Base) MCG/ACT inhaler, Inhale 2 puffs into the lungs every 6 (six) hours as needed for wheezing or shortness of breath (Cough)., Disp: 54 g, Rfl: 2   Cholecalciferol (VITAMIN D -3 PO), Take by mouth., Disp: , Rfl:    FLUoxetine (PROZAC) 20 MG capsule, Take 20 mg by mouth daily., Disp: , Rfl:    levocetirizine (XYZAL ) 5 MG tablet, Take 1 tablet (5 mg total) by mouth every evening., Disp: 30 tablet, Rfl: 5   Multiple Vitamins-Minerals  (MULTIVITAMIN WOMEN PO), Take 1 tablet by mouth daily., Disp: , Rfl:    azelastine  (ASTELIN ) 0.1 % nasal spray, Place 1 spray into both nostrils 2 (two) times daily. Use in each nostril as directed (Patient not taking: Reported on 07/12/2024), Disp: 30 mL, Rfl: 1   fluticasone  (FLONASE ) 50 MCG/ACT nasal spray, Place 1 spray into both nostrils daily. (Patient not taking: Reported on 07/12/2024), Disp: 1 g, Rfl: 5   tirzepatide  (ZEPBOUND ) 2.5 MG/0.5ML Pen, Inject 2.5 mg into the skin once a week. (Patient not taking: Reported on 07/12/2024), Disp: 2 mL, Rfl: 1  "

## 2024-07-12 NOTE — Patient Instructions (Addendum)
 We did discuss about need for compliance to meet insurance requirement.  The patient has to use CPAP for at least 5 out of 7 days in a week for a consecutive month within the first 3 months after getting CPAP.  VISIT SUMMARY: During your visit, we discussed your asthma, obstructive sleep apnea, morbid obesity, and allergic rhinitis. We reviewed your current symptoms, medications, and treatment plans for each condition.  YOUR PLAN: -OBSTRUCTIVE SLEEP APNEA: Obstructive sleep apnea is a condition where your airway becomes blocked during sleep, causing breathing interruptions. We recommend using a CPAP machine with a hybrid mask for mouth breathers. Proper cleaning of the CPAP machine with soap and water is essential to prevent infections. You should use the CPAP for at least 4 hours per night, 5 out of 7 days for the first 3 months. We will schedule a nocturnal pulse oximetry test after you start using the CPAP to assess your oxygen levels. Discontinue the use of your dental device once the CPAP is set up.  -MILD PERSISTENT ASTHMA: Asthma is a condition that causes your airways to become inflamed and narrow, making it hard to breathe. Your asthma symptoms are mild and primarily triggered by exercise. Use Breo as needed for worsening asthma symptoms and albuterol  for exercise-induced asthma. If your symptoms worsen, we may consider adding Singulair to your treatment plan.  -MORBID OBESITY: Morbid obesity is a condition where excess body fat negatively affects your health. You have been prescribed Zepbound , but we are awaiting insurance approval. Weight loss is important for your overall health, but it may not be sufficient to manage your sleep apnea. We will follow up in one month after you start Zepbound  to assess your progress and adjust the dosage if needed.  -ALLERGIC RHINITIS: Allergic rhinitis is an allergic reaction that causes nasal congestion, sneezing, and a runny nose. You have intermittent nasal  congestion and nosebleeds likely due to nasal dryness. Use Flonase , two sprays in each nostril for the first seven days, then one spray daily. Use saline spray as needed for nasal congestion and azelastine  nasal spray as needed.  INSTRUCTIONS: We will follow up in one month after you start Zepbound  to assess your progress and adjust the dosage if needed. We will also schedule a nocturnal pulse oximetry test after you start using the CPAP machine to assess your oxygen levels.    Contains text generated by [employer]bridge.

## 2024-07-17 ENCOUNTER — Other Ambulatory Visit: Payer: Self-pay | Admitting: Obstetrics and Gynecology

## 2024-07-17 DIAGNOSIS — Z1231 Encounter for screening mammogram for malignant neoplasm of breast: Secondary | ICD-10-CM

## 2024-08-03 ENCOUNTER — Ambulatory Visit

## 2024-08-27 ENCOUNTER — Ambulatory Visit: Admitting: Obstetrics and Gynecology

## 2024-09-04 ENCOUNTER — Ambulatory Visit
